# Patient Record
Sex: Female | Born: 1985 | Hispanic: No | Marital: Married | Smoking: Former smoker
Health system: Southern US, Community
[De-identification: ages and names within clinical notes are randomized; demographics above are authoritative.]

## PROBLEM LIST (undated history)

## (undated) ENCOUNTER — Inpatient Hospital Stay (HOSPITAL_COMMUNITY): Payer: Self-pay

## (undated) DIAGNOSIS — R0609 Other forms of dyspnea: Secondary | ICD-10-CM

## (undated) DIAGNOSIS — D696 Thrombocytopenia, unspecified: Secondary | ICD-10-CM

## (undated) DIAGNOSIS — D649 Anemia, unspecified: Secondary | ICD-10-CM

## (undated) DIAGNOSIS — K219 Gastro-esophageal reflux disease without esophagitis: Secondary | ICD-10-CM

## (undated) DIAGNOSIS — R0600 Dyspnea, unspecified: Secondary | ICD-10-CM

## (undated) DIAGNOSIS — R000 Tachycardia, unspecified: Secondary | ICD-10-CM

## (undated) HISTORY — PX: WISDOM TOOTH EXTRACTION: SHX21

## (undated) HISTORY — DX: Other forms of dyspnea: R06.09

## (undated) HISTORY — DX: Tachycardia, unspecified: R00.0

## (undated) HISTORY — DX: Dyspnea, unspecified: R06.00

## (undated) HISTORY — PX: APPENDECTOMY: SHX54

---
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
## 2003-03-14 ENCOUNTER — Other Ambulatory Visit: Admission: RE | Admit: 2003-03-14 | Discharge: 2003-03-14 | Payer: Self-pay | Admitting: Obstetrics and Gynecology

## 2003-03-23 ENCOUNTER — Encounter (INDEPENDENT_AMBULATORY_CARE_PROVIDER_SITE_OTHER): Payer: Self-pay | Admitting: Specialist

## 2003-03-24 ENCOUNTER — Observation Stay (HOSPITAL_COMMUNITY): Admission: EM | Admit: 2003-03-24 | Discharge: 2003-03-25 | Payer: Self-pay | Admitting: Emergency Medicine

## 2003-04-04 ENCOUNTER — Emergency Department (HOSPITAL_COMMUNITY): Admission: EM | Admit: 2003-04-04 | Discharge: 2003-04-04 | Payer: Self-pay | Admitting: Emergency Medicine

## 2003-04-25 ENCOUNTER — Emergency Department (HOSPITAL_COMMUNITY): Admission: EM | Admit: 2003-04-25 | Discharge: 2003-04-25 | Payer: Self-pay | Admitting: Family Medicine

## 2003-05-12 ENCOUNTER — Emergency Department (HOSPITAL_COMMUNITY): Admission: EM | Admit: 2003-05-12 | Discharge: 2003-05-12 | Payer: Self-pay | Admitting: Emergency Medicine

## 2003-07-13 ENCOUNTER — Emergency Department (HOSPITAL_COMMUNITY): Admission: EM | Admit: 2003-07-13 | Discharge: 2003-07-13 | Payer: Self-pay | Admitting: Emergency Medicine

## 2003-07-18 ENCOUNTER — Other Ambulatory Visit: Admission: RE | Admit: 2003-07-18 | Discharge: 2003-07-18 | Payer: Self-pay | Admitting: Obstetrics and Gynecology

## 2003-08-06 ENCOUNTER — Emergency Department (HOSPITAL_COMMUNITY): Admission: EM | Admit: 2003-08-06 | Discharge: 2003-08-07 | Payer: Self-pay | Admitting: Emergency Medicine

## 2003-08-09 ENCOUNTER — Ambulatory Visit (HOSPITAL_COMMUNITY): Admission: RE | Admit: 2003-08-09 | Discharge: 2003-08-09 | Payer: Self-pay | Admitting: Emergency Medicine

## 2003-09-24 ENCOUNTER — Inpatient Hospital Stay (HOSPITAL_COMMUNITY): Admission: AD | Admit: 2003-09-24 | Discharge: 2003-09-24 | Payer: Self-pay | Admitting: Obstetrics and Gynecology

## 2003-09-29 ENCOUNTER — Emergency Department (HOSPITAL_COMMUNITY): Admission: EM | Admit: 2003-09-29 | Discharge: 2003-09-29 | Payer: Self-pay | Admitting: Emergency Medicine

## 2003-10-19 ENCOUNTER — Inpatient Hospital Stay (HOSPITAL_COMMUNITY): Admission: AD | Admit: 2003-10-19 | Discharge: 2003-10-19 | Payer: Self-pay | Admitting: Obstetrics and Gynecology

## 2003-11-25 ENCOUNTER — Inpatient Hospital Stay (HOSPITAL_COMMUNITY): Admission: AD | Admit: 2003-11-25 | Discharge: 2003-11-25 | Payer: Self-pay | Admitting: Obstetrics and Gynecology

## 2004-01-14 ENCOUNTER — Emergency Department (HOSPITAL_COMMUNITY): Admission: EM | Admit: 2004-01-14 | Discharge: 2004-01-14 | Payer: Self-pay | Admitting: Family Medicine

## 2004-02-12 ENCOUNTER — Encounter: Payer: Self-pay | Admitting: Obstetrics and Gynecology

## 2004-02-12 ENCOUNTER — Observation Stay (HOSPITAL_COMMUNITY): Admission: AD | Admit: 2004-02-12 | Discharge: 2004-02-13 | Payer: Self-pay | Admitting: Obstetrics and Gynecology

## 2004-04-06 ENCOUNTER — Inpatient Hospital Stay (HOSPITAL_COMMUNITY): Admission: AD | Admit: 2004-04-06 | Discharge: 2004-04-06 | Payer: Self-pay | Admitting: Obstetrics and Gynecology

## 2004-04-15 ENCOUNTER — Inpatient Hospital Stay (HOSPITAL_COMMUNITY): Admission: AD | Admit: 2004-04-15 | Discharge: 2004-04-15 | Payer: Self-pay | Admitting: Obstetrics and Gynecology

## 2004-04-30 ENCOUNTER — Inpatient Hospital Stay (HOSPITAL_COMMUNITY): Admission: AD | Admit: 2004-04-30 | Discharge: 2004-04-30 | Payer: Self-pay | Admitting: Obstetrics and Gynecology

## 2004-05-08 ENCOUNTER — Inpatient Hospital Stay (HOSPITAL_COMMUNITY): Admission: AD | Admit: 2004-05-08 | Discharge: 2004-05-09 | Payer: Self-pay | Admitting: Obstetrics and Gynecology

## 2004-05-10 ENCOUNTER — Inpatient Hospital Stay (HOSPITAL_COMMUNITY): Admission: AD | Admit: 2004-05-10 | Discharge: 2004-05-15 | Payer: Self-pay | Admitting: Obstetrics and Gynecology

## 2004-08-26 ENCOUNTER — Emergency Department (HOSPITAL_COMMUNITY): Admission: EM | Admit: 2004-08-26 | Discharge: 2004-08-27 | Payer: Self-pay | Admitting: Emergency Medicine

## 2004-09-10 ENCOUNTER — Emergency Department (HOSPITAL_COMMUNITY): Admission: EM | Admit: 2004-09-10 | Discharge: 2004-09-10 | Payer: Self-pay | Admitting: Emergency Medicine

## 2004-09-21 ENCOUNTER — Emergency Department (HOSPITAL_COMMUNITY): Admission: EM | Admit: 2004-09-21 | Discharge: 2004-09-21 | Payer: Self-pay | Admitting: Family Medicine

## 2004-10-06 ENCOUNTER — Inpatient Hospital Stay (HOSPITAL_COMMUNITY): Admission: AD | Admit: 2004-10-06 | Discharge: 2004-10-06 | Payer: Self-pay | Admitting: Obstetrics and Gynecology

## 2004-10-20 ENCOUNTER — Ambulatory Visit (HOSPITAL_COMMUNITY): Admission: RE | Admit: 2004-10-20 | Discharge: 2004-10-20 | Payer: Self-pay | Admitting: Cardiology

## 2004-11-05 ENCOUNTER — Other Ambulatory Visit: Admission: RE | Admit: 2004-11-05 | Discharge: 2004-11-05 | Payer: Self-pay | Admitting: Obstetrics and Gynecology

## 2004-12-05 ENCOUNTER — Emergency Department (HOSPITAL_COMMUNITY): Admission: EM | Admit: 2004-12-05 | Discharge: 2004-12-05 | Payer: Self-pay | Admitting: Family Medicine

## 2004-12-22 ENCOUNTER — Emergency Department (HOSPITAL_COMMUNITY): Admission: EM | Admit: 2004-12-22 | Discharge: 2004-12-22 | Payer: Self-pay | Admitting: *Deleted

## 2004-12-31 ENCOUNTER — Emergency Department (HOSPITAL_COMMUNITY): Admission: EM | Admit: 2004-12-31 | Discharge: 2004-12-31 | Payer: Self-pay | Admitting: Emergency Medicine

## 2005-01-23 ENCOUNTER — Emergency Department (HOSPITAL_COMMUNITY): Admission: EM | Admit: 2005-01-23 | Discharge: 2005-01-23 | Payer: Self-pay | Admitting: Family Medicine

## 2005-01-23 ENCOUNTER — Emergency Department (HOSPITAL_COMMUNITY): Admission: EM | Admit: 2005-01-23 | Discharge: 2005-01-23 | Payer: Self-pay | Admitting: Emergency Medicine

## 2005-02-24 ENCOUNTER — Inpatient Hospital Stay (HOSPITAL_COMMUNITY): Admission: AD | Admit: 2005-02-24 | Discharge: 2005-02-24 | Payer: Self-pay | Admitting: Obstetrics and Gynecology

## 2005-08-23 ENCOUNTER — Emergency Department (HOSPITAL_COMMUNITY): Admission: EM | Admit: 2005-08-23 | Discharge: 2005-08-23 | Payer: Self-pay | Admitting: Family Medicine

## 2005-10-10 ENCOUNTER — Emergency Department (HOSPITAL_COMMUNITY): Admission: EM | Admit: 2005-10-10 | Discharge: 2005-10-10 | Payer: Self-pay | Admitting: Family Medicine

## 2005-10-14 ENCOUNTER — Emergency Department (HOSPITAL_COMMUNITY): Admission: EM | Admit: 2005-10-14 | Discharge: 2005-10-14 | Payer: Self-pay | Admitting: Emergency Medicine

## 2006-05-29 ENCOUNTER — Inpatient Hospital Stay (HOSPITAL_COMMUNITY): Admission: AD | Admit: 2006-05-29 | Discharge: 2006-05-29 | Payer: Self-pay | Admitting: Obstetrics and Gynecology

## 2006-06-14 ENCOUNTER — Emergency Department (HOSPITAL_COMMUNITY): Admission: EM | Admit: 2006-06-14 | Discharge: 2006-06-14 | Payer: Self-pay | Admitting: Emergency Medicine

## 2006-12-28 ENCOUNTER — Inpatient Hospital Stay (HOSPITAL_COMMUNITY): Admission: AD | Admit: 2006-12-28 | Discharge: 2006-12-28 | Payer: Self-pay | Admitting: Family Medicine

## 2007-04-23 ENCOUNTER — Emergency Department (HOSPITAL_COMMUNITY): Admission: EM | Admit: 2007-04-23 | Discharge: 2007-04-23 | Payer: Self-pay | Admitting: Emergency Medicine

## 2007-11-07 ENCOUNTER — Encounter: Admission: RE | Admit: 2007-11-07 | Discharge: 2007-11-07 | Payer: Self-pay | Admitting: Family Medicine

## 2007-12-11 ENCOUNTER — Other Ambulatory Visit: Admission: RE | Admit: 2007-12-11 | Discharge: 2007-12-11 | Payer: Self-pay | Admitting: Obstetrics and Gynecology

## 2008-07-17 ENCOUNTER — Emergency Department (HOSPITAL_COMMUNITY): Admission: EM | Admit: 2008-07-17 | Discharge: 2008-07-17 | Payer: Self-pay | Admitting: Adult Health

## 2009-03-14 ENCOUNTER — Emergency Department (HOSPITAL_COMMUNITY): Admission: EM | Admit: 2009-03-14 | Discharge: 2009-03-14 | Payer: Self-pay | Admitting: Family Medicine

## 2009-03-14 ENCOUNTER — Emergency Department (HOSPITAL_COMMUNITY): Admission: EM | Admit: 2009-03-14 | Discharge: 2009-03-15 | Payer: Self-pay | Admitting: Emergency Medicine

## 2009-05-21 ENCOUNTER — Emergency Department (HOSPITAL_COMMUNITY): Admission: EM | Admit: 2009-05-21 | Discharge: 2009-05-21 | Payer: Self-pay | Admitting: Family Medicine

## 2009-07-25 ENCOUNTER — Emergency Department (HOSPITAL_COMMUNITY): Admission: EM | Admit: 2009-07-25 | Discharge: 2009-07-25 | Payer: Self-pay | Admitting: Family Medicine

## 2009-11-26 ENCOUNTER — Emergency Department (HOSPITAL_COMMUNITY): Admission: EM | Admit: 2009-11-26 | Discharge: 2009-11-26 | Payer: Self-pay | Admitting: Family Medicine

## 2010-03-29 ENCOUNTER — Encounter: Payer: Self-pay | Admitting: Obstetrics & Gynecology

## 2010-05-12 ENCOUNTER — Inpatient Hospital Stay (INDEPENDENT_AMBULATORY_CARE_PROVIDER_SITE_OTHER)
Admission: RE | Admit: 2010-05-12 | Discharge: 2010-05-12 | Disposition: A | Discharge status: Home / Self Care | Payer: Self-pay | Source: Ambulatory Visit | Attending: Family Medicine | Admitting: Family Medicine

## 2010-05-12 DIAGNOSIS — N760 Acute vaginitis: Secondary | ICD-10-CM

## 2010-05-12 LAB — WET PREP, GENITAL

## 2010-05-14 ENCOUNTER — Emergency Department (HOSPITAL_COMMUNITY)
Admission: EM | Admit: 2010-05-14 | Discharge: 2010-05-14 | Disposition: A | Discharge status: Home / Self Care | Payer: Self-pay | Attending: Emergency Medicine | Admitting: Emergency Medicine

## 2010-05-14 DIAGNOSIS — R059 Cough, unspecified: Secondary | ICD-10-CM | POA: Insufficient documentation

## 2010-05-14 DIAGNOSIS — J069 Acute upper respiratory infection, unspecified: Secondary | ICD-10-CM | POA: Insufficient documentation

## 2010-05-14 DIAGNOSIS — R05 Cough: Secondary | ICD-10-CM | POA: Insufficient documentation

## 2010-05-14 DIAGNOSIS — F17200 Nicotine dependence, unspecified, uncomplicated: Secondary | ICD-10-CM | POA: Insufficient documentation

## 2010-05-14 LAB — GC/CHLAMYDIA PROBE AMP, GENITAL
Chlamydia, DNA Probe: NEGATIVE
GC Probe Amp, Genital: NEGATIVE

## 2010-05-25 LAB — POCT URINALYSIS DIP (DEVICE)
Nitrite: NEGATIVE
Protein, ur: NEGATIVE mg/dL

## 2010-05-31 LAB — POCT URINALYSIS DIP (DEVICE)
Hgb urine dipstick: NEGATIVE
Nitrite: NEGATIVE
Protein, ur: NEGATIVE mg/dL
pH: 6.5 (ref 5.0–8.0)

## 2010-05-31 LAB — WET PREP, GENITAL: Trich, Wet Prep: NONE SEEN

## 2010-05-31 LAB — URINE CULTURE

## 2010-05-31 LAB — HCG, SERUM, QUALITATIVE: Preg, Serum: NEGATIVE

## 2010-05-31 LAB — POCT PREGNANCY, URINE: Preg Test, Ur: NEGATIVE

## 2010-06-16 LAB — POCT I-STAT, CHEM 8
BUN: 5 mg/dL — ABNORMAL LOW (ref 6–23)
Calcium, Ion: 1.21 mmol/L (ref 1.12–1.32)
Chloride: 104 mEq/L (ref 96–112)
Creatinine, Ser: 0.8 mg/dL (ref 0.4–1.2)
Potassium: 3.7 mEq/L (ref 3.5–5.1)

## 2010-06-16 LAB — POCT PREGNANCY, URINE: Preg Test, Ur: NEGATIVE

## 2010-07-24 NOTE — Discharge Summary (Signed)
NAMEMarland Kitchen  AASHKA, Brandy NO.:  [id number]   MEDICAL RECORD NO.:  [id number]          PATIENT TYPE:  INP   LOCATION:  9101                          FACILITY:  [facility]   PHYSICIAN:  Osborn Coho, M.D.   DATE OF BIRTH:  [birth year]   DATE OF ADMISSION:  05/10/2004  DATE OF DISCHARGE:  05/14/2004                                 DISCHARGE SUMMARY   ADMITTING DIAGNOSES:  1.  Intrauterine pregnancy at 40 weeks.  2.  Syncopal episode.  3.  PENICILLIN allergy.  4.  History of sexually transmitted disease.  5.  Prolonged prodromal labor.   DISCHARGE DIAGNOSES:  1.  Intrauterine pregnancy at 40-2/7 weeks.  2.  Prodromal labor.  3.  Transverse lie.  4.  Mild thrombocytopenia.   PROCEDURES:  1.  Primary low transverse cesarean section.  2.  Spinal anesthesia.   HOSPITAL COURSE:  Brandy Ferguson is a 25 year old gravida 1, para 0, at 40 weeks,  who was admitted on May 10, 2004, status post an episode of reported  syncope.  She had primarily dizzy episodes, never lost consciousness.  She  had been seen at maternity admissions 2 days prior with prodromal labor.  Her pregnancy had been remarkable for (1) History of abnormal Pap smear.  (2) PENICILLIN allergy.  (3) History of STDs.  (4) Previous smoker.  (5)  Beta strep positive.  On admission, O2 saturation was weakness.  The patient  was alert and oriented.  Fetal heart rate was reactive.  Cervix was a  fingertip, thick, with the presenting part high in the pelvis.  The patient  was admitted for 23 hour observation and therapeutic rest.  Through the  night, she continued to contract.  By the next morning, she was still having  contractions approximately every 5 minutes.  The decision was made to  proceed with induction; however, on pelvic examination, the patient was  noted to be presumptively breech.  Bedside ultrasound confirmed the breech  presentation.  A formal ultrasound was performed which, at that time, showed  the baby  in a transverse lie.  Dr. Estanislado Pandy was consulted, and the decision  was made after review of options including external version or cesarean  section, the patient elected to proceed with cesarean section.  The patient  was taken to the operating room where a primary low transverse cesarean  section was performed.  During the preparatory time for the C-section, the  baby was noted to be actively turning within the pelvis.  Ultrasound in the  OR revealed a very oblique lie with the head still not in the pelvis but  over in the right lower quadrant.  Risks and benefits of admission for  induction with Pitocin then with artificial rupture of membranes was  reviewed with the patient; however, in light of her unfavorable cervix,  unstable lie, and prodromal labor, the decision was made to proceed with  cesarean section.  The patient tolerated the procedure well.  She had a  viable female by the name of Josiya, born at 4:51.  Apgars were 9 and 9.  Weight was 7 pounds 13 ounces.  The patient was taken to the recovery room  in good condition.  Infant was taken to the full-term nursery.  By postop  day 1, the patient was doing well.  She was up ad lib.  She was bottle-  feeding.  She had staples noted.  Her hemoglobin was 10.1, white blood cell  count was 7.5, platelets were 122 which was up from 112 on admission.  The  patient's platelet count at her new OB visit was 216.  Through the rest of  her hospital stay, she continued to do well.  She did have vaginal yeast  noted prior to delivery.  During her hospital stay, she was given Diflucan.  By postop day 3, she was doing well.  She was ready for discharge.  Her  incision was clean, dry, and intact.  Her vital signs were stable.  She was  tolerating a regular diet, and her pain management was controlled well with  p.o. pain medication.  She was deemed to have received full benefit of her  hospital stay and was discharged home.   DISCHARGE  INSTRUCTIONS:  Per [facility] handout.   DISCHARGE MEDICATIONS:  1.  Motrin 600 mg p.o. q.6h. p.r.n. pain.  2.  Tylox 1-2 p.o. q.2-4h. p.r.n. pain.   Discharge follow up will occur in 6 weeks at [facility].  There  will be a recheck of her platelet count at 6 weeks postpartum.      VLL/MEDQ  D:  05/14/2004  T:  05/14/2004  Job:  [id number]

## 2010-07-24 NOTE — Op Note (Signed)
NAME:  Brandy Ferguson, Brandy Ferguson                           ACCOUNT NO.:  [id number]   MEDICAL RECORD NO.:  [id number]                   PATIENT TYPE:  INP   LOCATION:  0101                                 FACILITY:  [facility]   PHYSICIAN:  Lorre Munroe., M.D.            DATE OF BIRTH:  11/12/1985   DATE OF PROCEDURE:  03/24/2003  DATE OF DISCHARGE:                                 OPERATIVE REPORT   PREOPERATIVE DIAGNOSIS:  Abdominal pain, probable appendicitis.   POSTOPERATIVE DIAGNOSIS:  Abdominal pain, probably due to a ruptured ovarian  cyst or follicle.   OPERATION:  1. Laparoscopy.  2. Laparoscopic appendectomy.   SURGEON:  Zigmund Daniel, M.D.   ANESTHESIA:  General.   CLINICAL NOTE:  This is a 25 year old black female, who has had about a 3  month history of lower abdominal pain, but it became much worse yesterday,  and she was seen at the emergency room.  She was found to have right lower  quadrant tenderness.  She had a normal WBC count and had no fever and had  had no vomiting.  A CT scan was done which showed some free pelvic and right  gutter fluid, inflammation around the area of the appendix which was minor,  and slightly thickened appendix, suggesting appendicitis.  The patient was  given the option of observation versus laparoscopy and appendectomy, and she  felt that this pain was quite significant and wanted to be operated on.   DESCRIPTION OF PROCEDURE:  After the patient was monitored and anesthetized  and had routine preparation and draping of the abdomen, I anesthetized the  site in the lower midline and one just below the umbilicus and one in the  right upper quadrant.  I made a small transverse incision just below the  umbilicus and cut her fascia longitudinally in the midline and bluntly  opened the peritoneum.  I put in a 0 Vicryl pursestring suture and secured a  Hasson cannula and inflated the abdomen with CO2.  I then viewed the  abdominal contents and  saw a small amount of bloody but nonclotted fluid in  the right gutter and around the liver in the pelvis.  I could see the  appendix, and it did not look inflamed.  There was no purulence present.  The small bowel and colon looked completely normal, and the gallbladder  looked normal.  I then put in two additional ports, one in the right upper  quadrant and one in the lower midline and pulled the small bowel and colon  up out of the pelvis and inspected the pelvic organs.  There was a fair  amount of bloody fluid in the cul-de-sac.  I suctioned that out.  The tubes  and ovaries looked normal, except I could see a small area where there  appeared to have been some bleeding on the right ovary.  I saw no other  abnormalities, and I though the uterus looked normal.  I then positioned the  patient in slight Trendelenburg and slightly tilted to the left.  I elevated  the appendix.  There were quite a few adhesions in the appendix to the  lateral pelvic wall, and I took those down sharply and cauterized any  bleeders that occurred.  Then I dissected the appendix and mesentery down  until they were both quite free and thin.  I stapled across the mesentery  and the appendix with one firing of the endoscopic cutting stapler.  That  provided good hemostasis and good clean amputation of the appendix.  I then  further irrigated in the pelvis and the right gutter and removed the  irrigant, and I saw no evidence of persistent bleeding.  I then placed the  appendix in a plastic pouch and removed through the umbilical incision and  tied the pursestring suture.  I once again checked all areas for hemostasis  and found it to be good.  I removed the  right upper quadrant port under direct vision and then I allowed the CO2 to  escape and removed the lower midline port.  I then closed all the skin  incisions with intracuticular 4-0 Vicryl and Steri-Strips.  She tolerated it  well.  The sponge, needle, and  instrument counts were correct.                                               Lorre Munroe., M.D.    WB/MEDQ  D:  03/24/2003  T:  03/24/2003  Job:  [id number]

## 2010-07-24 NOTE — Discharge Summary (Signed)
NAMECARMIN, ALVIDREZ                 ACCOUNT NO.:  [id number]   MEDICAL RECORD NO.:  [id number]          PATIENT TYPE:  OBV   LOCATION:  9162                          FACILITY:  [facility]   PHYSICIAN:  Janine Limbo, M.D.DATE OF BIRTH:  09-30-1985   DATE OF ADMISSION:  02/12/2004  DATE OF DISCHARGE:  02/13/2004                                 DISCHARGE SUMMARY   ADMISSION DIAGNOSES:  1.  Intrauterine pregnancy at [redacted] weeks gestation.  2.  Report of syncopal episode and feeling weak.  3.  Reassuring fetal heart rate tracing.   DISCHARGE DIAGNOSES:  1.  Intrauterine pregnancy at 27 weeks, no further syncopal episode.  2  Reassuring fetal heart rate tracing.  1.  Reassuring laboratory studies.  2.  Reassuring CT scan.   PROCEDURES THIS ADMISSION:  None.   HOSPITAL COURSE:  [name] is a 25 year old single black female gravida 1  para 0 at [redacted] weeks gestation who was sent from [facility]  office to maternity admission for evaluation secondary to reported syncopal  episode while at the office.  She actually did not get completely  unresponsive but was complaining of feeling very weak and dizzy and having  chest pain.  She denies any trauma.  She reported having had only a bowl  of Fruit Loops and juice for her lunch that day.  Subsequent to admission  she has had no further syncopal episodes.  She has tolerated a regular diet  without difficulty.  She is ambulating, voiding and eating without  difficulty.  Her vital signs have also remained stable and she is afebrile.  Her fetal heart rate is also reassuring with reactivity.  She has had  occasional uterine contractions but nothing regular.  She is now deemed  ready for discharge.   DISCHARGE LABORATORY DATA:  Her hemoglobin is 11.7, wbc count is 5.9,  platelets are 147.  Sodium was 135, potassium is 3.3, AST is 19, ALT is 19,  uric acid is 3.4, LDH is 101.  Her CK-MB, her cardiac markers were all  within normal limits.   Her CT of the chest is also within normal limits with  no evidence of pulmonary embolus.   DISCHARGE INSTRUCTIONS:  To call for any signs or symptoms or preterm labor,  vaginal leaking or bleeding, or other distress.   DISCHARGE FOLLOW-UP:  As scheduled on February 20, 2004 at the office or  p.r.n..   DISCHARGE MEDICATIONS:  Continue on prenatal vitamins daily and to take  ibuprofen 600 mg p.o. q.6h. p.r.n. for discomfort up until [redacted] weeks  gestation.   DISCHARGE STATUS:  Well and stable.     Shel   SJD/MEDQ  D:  02/13/2004  T:  02/14/2004  Job:  [id number]

## 2010-07-24 NOTE — H&P (Signed)
NAME:  ROTUNDA, WORDEN NO.:  [id number]   MEDICAL RECORD NO.:  [id number]                   PATIENT TYPE:   LOCATION:                                       FACILITY:   PHYSICIAN:  Lorre Munroe., M.D.            DATE OF BIRTH:   DATE OF ADMISSION:  03/24/2003  DATE OF DISCHARGE:                                HISTORY & PHYSICAL   CHIEF COMPLAINT:  Abdominal pain.   HISTORY OF PRESENT ILLNESS:  The patient is a healthy 25 year old black  female who has had about a 62-month history of fairly persistent lower  abdominal pain.  It got much worse yesterday and was localized to the right  lower quadrant.  She has not vomited.  No diarrhea.  No cramps.  Her  appetite has decreased.  White count was normal and hemoglobin normal in the  emergency department.  The periods have been regular and a urine pregnancy  test was negative.  Urinalysis was unremarkable.  A CT scan was done showing  evidence of thickening of the appendix, free fluid, and some inflammation  around the appendiceal mesentery.  The patient is admitted for care.   PAST MEDICAL HISTORY:  Excellent health.  No medicines.  She has been told  she is ALLERGIC to PENICILLIN although she does not remember what the  reaction is.  She has never had any operations except for extraction of  wisdom teeth.  She has no chronic medical problems.   The family history and childhood illnesses unremarkable.   She does not smoke or drink or use any nonprescription drugs.   The review of systems of 15-points is unremarkable.   PHYSICAL EXAMINATION:  VITAL SIGNS:  Temperature and vital signs normal as  reported by nursing staff.  GENERAL:  A healthy-appearing young woman, slender.  In no acute distress.  HEAD, NECK, EYES, EARS, NOSE, MOUTH, AND THROAT:  Unremarkable.  CHEST:  Clear to auscultation.  HEART:  Rate and rhythm normal.  No murmur or gallop.  ABDOMEN:  Soft with bowel sounds.  Moderately tender  in the right lower  quadrant with rebound tenderness.  PELVIC AND RECTAL:  Not done.  EXTREMITIES:  Normal.  SKIN:  Normal.  LYMPH NODES:  Normal.   IMPRESSION:  Possible appendicitis.   PLAN:  A laparoscopic appendectomy as planned after a thorough discussion  with the patient of pros and cons of observation versus immediate surgery  are discussed.  She accepts the risks that there will of an operation for a  self-limited illness with whether it would have gotten better by itself,  that she could have infections, bleeding or other problems.  We will proceed  immediately with surgery.  Lorre Munroe., M.D.    WB/MEDQ  D:  03/24/2003  T:  03/24/2003  Job:  [id number]

## 2010-07-24 NOTE — H&P (Signed)
NAME:  Brandy Ferguson, WHEELESS NO.:  [id number]   MEDICAL RECORD NO.:  [id number]          PATIENT TYPE:  OBV   LOCATION:  9174                          FACILITY:  [facility]   PHYSICIAN:  Osborn Coho, M.D.   DATE OF BIRTH:  Jul 02, 1985   DATE OF ADMISSION:  05/10/2004  DATE OF DISCHARGE:                                HISTORY & PHYSICAL   HISTORY OF PRESENT ILLNESS:  [name] is a 25 year old gravida 1, para 0,  at [redacted] weeks gestation, EDD May 11, 2004, who presents by EMS following an  episode of syncope at home today.  She reports several episodes of syncope  x3 today throughout the day.  She reports that she was sitting and felt  dizzy and the episodes of syncope were brief with no residual effects,  although, she has felt dizzy and short of breath throughout the day.  She  also complains of lower abdominal and back pain.  She remains short of  breath at the present time with continued lower abdominal and back pain.  No  nausea and vomiting.  She states she is eating and drinking.  She ate  chicken at 2100 on May 10, 2004.  She reports positive fetal movement, no  vaginal bleeding, no rupture of membranes.  Her pregnancy has been followed  by the [facility] service at [facility] and is remarkable for:  1) History of abnormal  Pap smear.  2) Penicillin allergy.  3) History of STD's.  4) Previous  smoker.  5) Group B Strep positive.   PRENATAL LABORATORY DATA:  October 11, 2003; hemoglobin and hematocrit 13.2  and 38.9, platelets 216,000. Blood type and Rh O positive, antibody screen  negative, VDRL nonreactive, rubella immune.  Hepatitis B surface antigen  negative, HIV nonreactive.  Hemoglobin electrophoresis negative, CF testing  negative.  Pap smear within normal limits.  Throughout her pregnancy, she  has been positive several times for Chlamydia in both the second and third  trimesters.  Test of cure on March 27, 2004, all negative.  Her pregnancy  has been followed since her  initial prenatal visit on October 11, 2003, at  nine weeks gestation, [facility] determined by dates and confirmed with ultrasound.  As stated above, the patient has been treated repeatedly for positive  Chlamydia x3 throughout her pregnancy.  Test of cure has been negative.  She  has had several incidences of preterm contractions.  Fetal fibronectin has  been negative and the patient's cervix has remained undilated.  She has been  size equal dates throughout, normotensive, with no proteinuria.  She has  also been evaluated throughout her pregnancy for shortness of breath, spiral  CT done in December was negative.   OB HISTORY:  Present pregnancy.   PAST MEDICAL HISTORY:  Significant for an abnormal Pap and colposcopy.  The  patient has had repeated STD's.  She is a smoker.  She quit with her  pregnancy.  She had her wisdom teeth removed in November of 2002, a  laparoscopic appendectomy in January of 2005, and ovarian cyst.   FAMILY  HISTORY:  The patient's mother with a history of hypertension,  maternal grandmother and uncle with diabetes, a cousin of the patient's had  seizures as a child.   GENETIC HISTORY:  Significant for a female cousin with cerebral palsy.   ALLERGIES:  PENICILLIN.   HABITS:  She quit cigarette smoking with positive UPT and denies the use of  alcohol or illicit drugs.   SOCIAL HISTORY:  [name] is a 25 year old single African-American female.  She does not name a father of the baby.  She does not subscribe to a  religious faith.   REVIEW OF SYSTEMS:  As described above.   PHYSICAL EXAMINATION:  VITAL SIGNS:  Stable, the patient is afebrile.  HEENT:  Unremarkable.  HEART:  Regular rate and rhythm.  LUNGS:  Clear bilaterally throughout to auscultation.  Her O2 saturation is  98 to 99%.  GENERAL:  She is alert and oriented x3.  The baseline of the fetal heart  rate monitor is 140's with average longterm variability, reactivity is  present with no periodic  changes.  The patient is contracting irregularly  every two to eight minutes.  PELVIC:  Her cervix on digital examination is a fingertip dilated, thick,  with the presenting part high in the pelvis.  ABDOMEN:  Soft and nontender.  She has negative CVA tenderness bilaterally.  EXTREMITIES:  No edema.  DTR's are 1+ with no clonus.  She has no calf  tenderness.   Spiral CT done on February 12, 2004, is within normal limits with no evidence  of PE.   ASSESSMENT:  1.  Intrauterine pregnancy at term.  2.  Shortness of breath.  3.  Abdominal and back pain.   PLAN:  Admit for 23-hour observation for therapeutic rest.  She is to be  medicated with morphine 5 mg IV and 5 mg subcu per Osborn Coho, M.D. and  to reevaluate following therapeutic rest.      SDM/MEDQ  D:  05/11/2004  T:  05/11/2004  Job:  [id number]

## 2010-07-24 NOTE — Op Note (Signed)
NAMESHUNDRA, WIRSING                 ACCOUNT NO.:  [id number]   MEDICAL RECORD NO.:  [id number]          PATIENT TYPE:  OIB   LOCATION:  2899                         FACILITY:  [facility]   PHYSICIAN:  Armanda Magic, M.D.     DATE OF BIRTH:  [birth year]   DATE OF PROCEDURE:  10/20/2004  DATE OF DISCHARGE:  10/20/2004                                 OPERATIVE REPORT   PROCEDURE PERFORMED:  Tilt table test.   OPERATOR:  Armanda Magic, M.D.   INDICATIONS FOR PROCEDURE:  Syncope.   This is a very pleasant 25 year old black female with multiple episodes of  presyncope and syncope recently.  Several of them occurred while she was in  the doctor's office.  She now presents for tilt table testing.   DESCRIPTION OF PROCEDURE:  The patient was brought to the cardiac  catheterization laboratory in a fasting nonsedated state.  Informed consent  was obtained.  The patient was placed on continuous heart rate, pulse  oximetry monitoring and intermittent blood pressure monitoring.  Supine  baseline blood pressure was measured for five minutes and averaged around  110/67 to 114/77 mmHg with a heart rate in the 60s.  The patient was then  tilted upright to 70 degrees for a total of 30 minutes.  Lowest blood  pressure achieved during upright tilt was 99/68 mmHg.  The patient's highest  heart rate was in the 80s.  The patient was then placed supine and started  on Isuprel at 1 mcg and after an increase in heart rate of 20% from baseline  occurred, the patient was retilted upright for a total of 6 minutes.  Blood  pressure on initial upright tilt was 99/76 with a heart rate of 62.  Patient  then started having hiccups and then became very nauseated at four minutes  into the upright tilt.  At four minutes into the upright tilt, the blood  pressure was 104/69 with a heart rate of 78.  The patient became profoundly  nauseated and started gagging.  Blood pressure dropped to 84/56 with a heart  rate of 54 and the  patient passed out.  She was subsequently placed supine  with improvement in her blood pressure and heart rate and was then alert.  The patient was then after fully awakened, blood pressure had stabilized,  patient was transferred back to room in stable condition and later was  discharged to home.   IMPRESSION:  1.  Positive tilt table test for vasovagal syncope.   PLAN:  1.  Start Zoloft 25 mg a day to try to suppress the vasovagal syncope.  Of      note, the patient recently had a baby about eight months ago, but is not      breast feeding.  I will hold off on starting her on beta blockers at      this time since her heart rate is in the low 60s at baseline and she had      a systolic blood pressure in the low 100s in our      office.  She is going to see me back in two weeks and we will repeat her      tilt table test in three weeks to make sure that she no longer has      syncope.  She is to continue not to drive or work since she on some      occasions has no warning before passing out.      Armanda Magic, M.D.  Electronically Signed     TT/MEDQ  D:  10/21/2004  T:  10/21/2004  Job:  [id number]

## 2010-07-24 NOTE — H&P (Signed)
NAMEMarland Kitchen  Brandy, Ferguson                 ACCOUNT NO.:  [id number]   MEDICAL RECORD NO.:  [id number]          PATIENT TYPE:  OBV   LOCATION:  9162                          FACILITY:  [facility]   PHYSICIAN:  Naima A. Dillard, M.D. DATE OF BIRTH:  [birth year]   DATE OF ADMISSION:  02/12/2004  DATE OF DISCHARGE:                                HISTORY & PHYSICAL   HISTORY:  [name] is a 25 year old single black female primigravida at [redacted]  weeks gestation who presented from [facility] office after a  syncopal episode there.  She had no head injury at that time.  Patient has  some occasional contractions which is why she had an evaluation at the  office today.  Her pregnancy has been followed by the [facility] certified nurse midwife service and has been remarkable for (1) history  of abnormal Pap smear, (2) questionable PENICILLIN allergy, (3) history of  STDs, (4) previous smoker, (5) irregular cycles.   PRENATAL LABORATORIES:  Her prenatal labs were collected on October 11, 2003.  Hemoglobin 13.2, hematocrit 38.9, platelets 216,000, blood type O positive,  antibody negative, RPR nonreactive, rubella immune, hepatitis B surface  antigen negative, HIV nonreactive.  Pap smear from May 2005 was within  normal limits.  Gonorrhea and Chlamydia from that same time are negative.  Cystic fibrosis negative.  Hemoglobin electrophoresis negative.  Patient had  a positive Chlamydia in September 2005 and she was treated with Zithromax.  Patient was again positive for Chlamydia in October 2005 and was treated  with Zithromax.  Her test of cure was negative on January 08, 2004.   HISTORY OF PRESENT PREGNANCY:  Patient presented for care at [facility] at 9-1/[redacted] weeks gestation.  She was treated with MetroGel for  bacterial vaginosis at that time.  Pregnancy ultrasonography at [redacted] weeks  gestation confirmed [facility] of May 11, 2004.   OBSTETRICAL HISTORY:  She is a primigravida.   MEDICAL HISTORY:  She has a questionable PENICILLIN allergy, as a child she  was told not to take it.  She experienced menarche at the age of 28.  She  has had an irregular cycle since then.  She had an abnormal Pap smear in  2002 and 2004.  She had colposcopy in 2002.  She had Chlamydia in 2004.  She  reports having had the usual childhood illnesses.  She had Trichomonas in  October 2003.  She has a history of anemia.  She smoked until the beginning  of her pregnancy when she stopped.   SURGICAL HISTORY:  Wisdom teeth extraction November 2002 and appendectomy in  January 2005.   FAMILY MEDICAL HISTORY:  Mother with hypertension, maternal grandmother and  uncle with diabetes, cousin with seizures as a child.   GENETIC HISTORY:  Genetic history is remarkable for female cousin with  cerebral palsy.   SOCIAL HISTORY:  Father of the baby is not involved with the pregnancy.  Patient has 13 years of education and is currently unemployed.  She smoked  cigarettes until September 11, 2003.  She denies  any alcohol or street drug use  with the pregnancy.   OBJECTIVE DATA:  VITAL SIGNS:  Stable.  She is afebrile.  HEENT:  Grossly within normal limits.  CHEST:  Clear to auscultation.  HEART:  Regular rate and rhythm.  ABDOMEN:  Gravid in contour with fundal height extending approximately 27 cm  above pubic symphysis.  Fetal heart rate is reassuring with positive  accelerations and no decelerations.  Toco shows no contractions, occasional  irritability.  PELVIC:  Cervix is closed and long.  EXTREMITIES:  Within normal limits.   LABORATORIES:  EKG shows normal sinus rhythm.  Spiral CT shows no evidence  of PE.  Her cardiac enzymes were normal and the rest of her lab work was  normal.   ASSESSMENT:  1.  Intrauterine pregnancy at 27 weeks.  2.  Syncopal episode.   PLAN:  Admit to antenatal for 23-hour observation.     Kimb   KS/MEDQ  D:  02/12/2004  T:  02/12/2004  Job:  [id number]

## 2010-07-24 NOTE — Op Note (Signed)
NAME:  Brandy Ferguson, Brandy Ferguson                 ACCOUNT NO.:  [id number]   MEDICAL RECORD NO.:  [id number]          PATIENT TYPE:  INP   LOCATION:  9101                          FACILITY:  [facility]   PHYSICIAN:  Crist Fat. Rivard, M.D. DATE OF BIRTH:  August 13, 1985   DATE OF PROCEDURE:  DATE OF DISCHARGE:                                 OPERATIVE REPORT   PREOPERATIVE DIAGNOSES:  1.  Intrauterine pregnancy at 40 weeks 2 days.  2.  Transverse lie.  3.  Prodromal labor.   POSTOPERATIVE DIAGNOSES:  1.  Intrauterine pregnancy at 40 weeks 2 days.  2.  Transverse lie.  3.  Prodromal labor.   PROCEDURE:  Primary low transverse cesarean section.   SURGEON:  Crist Fat. Rivard, M.D.   ASSISTANTRenaldo Reel. Latham, C.N.M.   ESTIMATED BLOOD LOSS:  800 mL.   PROCEDURE:  After being informed of the planned procedure with possible  complications including bleeding, infection, injury to bowel, bladder and  ureters, as well as the risks of VBAC, informed consent was obtained.  Please note that discussion took place with the possibility of external  cephalic version, which the patient declined.  The patient was taken to OR  #1, given spinal anesthesia without complication, and placed in the dorsal  decubitus position, pelvis tilted toward the left.  She was prepped and  draped in a sterile fashion and a Foley catheter was inserted in her  bladder.  After assessing adequate level of anesthesia, we proceeded with  infiltration of the suprapubic area using 20 mL of Marcaine 0.25%.  At that  time increased fetal activity was noted and question was raised on the  current presentation of the baby, and so a sterile ultrasound probe was used  to perform ultrasound and confirm an oblique station with head now in the  right lower quadrant as opposed to the left upper quadrant of the mother,  previously noted on ultrasound findings.  Before proceeding, the patient was  informed of the change of her infant's lie, was informed  that it was  currently right lower quadrant oblique, could easily be turned to vertex  presentation and patient could be transferred back to labor and delivery  with cervical ripening and possible induction.  The patient inquired at that  time, How can I confirm that the baby will not change position during the  process of induction? and I told her that I could not guarantee that,  especially in light of multiple lie changes during the day even despite  contractions every five minutes.  After reviewing risks and benefits of both  options, the patient requests proceeding with primary low transverse  cesarean section.   We proceeded with a Pfannenstiel incision using knife, which was brought  down to the fascia.  Fascia was then incised in a low transverse fashion.  Linea alba was bisected, peritoneum was entered in a midline fashion.  At  this time the baby is in transverse lie with head to maternal right.  We  proceed with amniotomy.  Amniotic fluid is clear, and baby comes down  head-  first.  We assist the birth of a female infant in vertex presentation at  4:51 p.m.  Mouth and nose are suctioned, nuchal cord is reduced and body is  delivered.  Cord is clamped with two Kelly clamps and sectioned, and the  baby is given to the pediatrician present in the room.  Twenty milliliters  of blood is drawn from the umbilical vein, and the placenta is allowed to  deliver spontaneously.  It is complete, cord has three vessels, and uterine  revision is negative.  We proceed with closure of the myometrium in two  layers, first with a running locked suture of 0 Vicryl, then with a Lembert  suture of 0 Vicryl imbricating the first layer.   Hemostasis on the incision is adequate.  Both paracolic gutters are  cleansed, both tubes and ovaries assessed and normal, and the pelvis is  profusely irrigated with warm saline.  Hemostasis is rechecked and adequate.  Under-fascia hemostasis is completed with  cautery, and the fascia is closed  with two running sutures of 1 Vicryl meeting midline.  Incision is irrigated  with warm saline, hemostasis is completed with cautery, and skin is closed  with staples.   Instrument and sponge count is complete x2.  Estimated blood loss is 800 mL.  The procedure is very well-tolerated by the patient, who is taken to the  recovery room in a well and stable condition.   A little girl named Sandria Bales was born at 4:51 p.m., received an Apgar of 9  at one minute and 9 at five minutes, and weighs 7 pounds 13 ounces.      SAR/MEDQ  D:  05/11/2004  T:  05/12/2004  Job:  [id number]

## 2010-10-29 ENCOUNTER — Emergency Department (HOSPITAL_COMMUNITY)
Admission: EM | Admit: 2010-10-29 | Discharge: 2010-10-30 | Disposition: A | Discharge status: Home / Self Care | Payer: Self-pay | Attending: Emergency Medicine | Admitting: Emergency Medicine

## 2010-10-29 DIAGNOSIS — F17200 Nicotine dependence, unspecified, uncomplicated: Secondary | ICD-10-CM | POA: Insufficient documentation

## 2010-10-29 DIAGNOSIS — A599 Trichomoniasis, unspecified: Secondary | ICD-10-CM | POA: Insufficient documentation

## 2010-10-29 DIAGNOSIS — N12 Tubulo-interstitial nephritis, not specified as acute or chronic: Secondary | ICD-10-CM | POA: Insufficient documentation

## 2010-10-29 DIAGNOSIS — R509 Fever, unspecified: Secondary | ICD-10-CM | POA: Insufficient documentation

## 2010-10-29 LAB — URINE MICROSCOPIC-ADD ON

## 2010-10-29 LAB — URINALYSIS, ROUTINE W REFLEX MICROSCOPIC
Protein, ur: 30 mg/dL — AB
Specific Gravity, Urine: 1.014 (ref 1.005–1.030)

## 2010-10-31 ENCOUNTER — Emergency Department (HOSPITAL_COMMUNITY)
Admission: EM | Admit: 2010-10-31 | Discharge: 2010-11-01 | Disposition: A | Discharge status: Home / Self Care | Payer: Self-pay | Attending: Emergency Medicine | Admitting: Emergency Medicine

## 2010-10-31 DIAGNOSIS — F17200 Nicotine dependence, unspecified, uncomplicated: Secondary | ICD-10-CM | POA: Insufficient documentation

## 2010-10-31 DIAGNOSIS — R109 Unspecified abdominal pain: Secondary | ICD-10-CM | POA: Insufficient documentation

## 2010-10-31 DIAGNOSIS — A599 Trichomoniasis, unspecified: Secondary | ICD-10-CM | POA: Insufficient documentation

## 2010-11-27 LAB — URINALYSIS, ROUTINE W REFLEX MICROSCOPIC
Bilirubin Urine: NEGATIVE
Ketones, ur: NEGATIVE
Nitrite: NEGATIVE
Protein, ur: 30 — AB
Specific Gravity, Urine: 1.014
Urobilinogen, UA: 0.2

## 2010-11-27 LAB — URINE MICROSCOPIC-ADD ON

## 2010-11-27 LAB — POCT PREGNANCY, URINE
Operator id: 29452
Preg Test, Ur: NEGATIVE

## 2010-12-16 LAB — CBC
HCT: 36.6
Hemoglobin: 12.6
MCHC: 34.5
MCV: 96.2
RBC: 3.81 — ABNORMAL LOW

## 2010-12-16 LAB — GC/CHLAMYDIA PROBE AMP, GENITAL
Chlamydia, DNA Probe: NEGATIVE
GC Probe Amp, Genital: NEGATIVE

## 2010-12-16 LAB — WET PREP, GENITAL
Clue Cells Wet Prep HPF POC: NONE SEEN
WBC, Wet Prep HPF POC: NONE SEEN

## 2012-01-25 ENCOUNTER — Emergency Department (INDEPENDENT_AMBULATORY_CARE_PROVIDER_SITE_OTHER)
Admission: EM | Admit: 2012-01-25 | Discharge: 2012-01-25 | Disposition: A | Discharge status: Home / Self Care | Payer: Self-pay | Source: Home / Self Care | Attending: Emergency Medicine | Admitting: Emergency Medicine

## 2012-01-25 ENCOUNTER — Encounter (HOSPITAL_COMMUNITY): Payer: Self-pay | Admitting: *Deleted

## 2012-01-25 DIAGNOSIS — K047 Periapical abscess without sinus: Secondary | ICD-10-CM

## 2012-01-25 MED ORDER — AMOXICILLIN 500 MG PO CAPS: 500.0000 mg | ORAL_CAPSULE | Freq: Three times a day (TID) | ORAL | Status: DC

## 2012-01-25 MED ORDER — HYDROCODONE-ACETAMINOPHEN 5-325 MG PO TABS: ORAL_TABLET | ORAL | Status: DC

## 2012-01-25 NOTE — ED Notes (Signed)
Dr. Lorenz Coaster used 5 ml. Viscous Lidocaine to numb abscess in mouth before I and D.

## 2012-01-25 NOTE — ED Provider Notes (Signed)
Chief Complaint  Patient presents with  . Dental Pain    History of Present Illness:   The patient is a 26 year old female who presents with a two-week history of pain in the left upper first bicuspid. This is a severe and throbbing pain rated 11/10 in intensity. Today she has a swollen place in her gown just above the bicuspid. It's not draining any pus. There is to chew on that side she has no difficulty swallowing or breathing. She denies any fever, although she has had some chills. She's had no chest pain, shortness of breath, or coughing.  Review of Systems:  Other than noted above, the patient denies any of the following symptoms: Systemic:  No fever, chills, sweats or weight loss. ENT:  No headache, ear ache, sore throat, nasal congestion, facial pain, or swelling. Lymphatic:  No adenopathy. Lungs:  No coughing, wheezing or shortness of breath.  PMFSH:  Past medical history, family history, social history, meds, and allergies were reviewed.  Physical Exam:   Vital signs:  BP 109/69  Pulse 71  Temp 99.3 F (37.4 C) (Oral)  Resp 16  SpO2 100%  LMP 01/06/2012 General:  Alert, oriented, in no distress. ENT:  TMs and canals normal.  Nasal mucosa normal. Mouth exam:  Her teeth look in fairly good shape. She has fillings almost all her teeth but there is no obvious dental decay. The left upper first bicuspid was tender to touch. There is a swollen area at the root of this tooth which appears to have some pus pointing towards the surface. Neck:  No swelling or adenopathy. Lungs:  Breath sounds clear and equal bilaterally.  No wheezes, rales or rhonchi. Heart:  Regular rhythm.  No gallops or murmers. Skin:  Clear, warm and dry.  Procedure Note:  Verbal informed consent was obtained from the patient.  Risks and benefits were outlined with the patient.  Patient understands and accepts these risks.  Identity of the patient was confirmed verbally and by armband.    Procedure was performed  as followed:  The area was anesthetized with application of a small amount of viscous Xylocaine. A single incision was made into the area with a #11 scalpel blade yielding a few drops of pus. Patient tolerated this procedure well. Bleeding was controlled with steady pressure with a gauze pad.  Patient tolerated the procedure well without any immediate complications.  Assessment:  The encounter diagnosis was Dental abscess.  Plan:   1.  The following meds were prescribed:   New Prescriptions   AMOXICILLIN (AMOXIL) 500 MG CAPSULE    Take 1 capsule (500 mg total) by mouth 3 (three) times daily.   HYDROCODONE-ACETAMINOPHEN (NORCO/VICODIN) 5-325 MG PER TABLET    1 to 2 tabs every 4 to 6 hours as needed for pain.   2.  The patient was instructed in symptomatic care and handouts were given. 3.  The patient was told to return if becoming worse in any way, if no better in 3 or 4 days, and given some red flag symptoms that would indicate earlier return, especially difficulty breathing. 4.  The patient was told to follow up with a dentist as soon as possible.    [name], MD 01/25/12 [phone]

## 2012-01-25 NOTE — ED Notes (Signed)
Bed:UC09<BR> Expected date:<BR> Expected time:<BR> Means of arrival:<BR> Comments:<BR>

## 2012-01-25 NOTE — ED Notes (Signed)
C/o L upper jaw toothache and swelling to L side of face onset this AM.  Face was sore when she rubbed it for the past 2 weeks.

## 2012-02-16 ENCOUNTER — Emergency Department (HOSPITAL_COMMUNITY)
Admission: EM | Admit: 2012-02-16 | Discharge: 2012-02-16 | Disposition: A | Discharge status: Home / Self Care | Payer: Self-pay | Attending: Emergency Medicine | Admitting: Emergency Medicine

## 2012-02-16 ENCOUNTER — Encounter (HOSPITAL_COMMUNITY): Payer: Self-pay | Admitting: Emergency Medicine

## 2012-02-16 DIAGNOSIS — Z791 Long term (current) use of non-steroidal anti-inflammatories (NSAID): Secondary | ICD-10-CM | POA: Insufficient documentation

## 2012-02-16 DIAGNOSIS — Z79899 Other long term (current) drug therapy: Secondary | ICD-10-CM | POA: Insufficient documentation

## 2012-02-16 DIAGNOSIS — R51 Headache: Secondary | ICD-10-CM | POA: Insufficient documentation

## 2012-02-16 DIAGNOSIS — F17200 Nicotine dependence, unspecified, uncomplicated: Secondary | ICD-10-CM | POA: Insufficient documentation

## 2012-02-16 DIAGNOSIS — R42 Dizziness and giddiness: Secondary | ICD-10-CM | POA: Insufficient documentation

## 2012-02-16 MED ORDER — METOCLOPRAMIDE HCL 5 MG/ML IJ SOLN
10.0000 mg | Freq: Once | INTRAMUSCULAR | Status: AC
  Administered 2012-02-16: 10 mg via INTRAVENOUS
  Filled 2012-02-16: qty 2

## 2012-02-16 MED ORDER — SODIUM CHLORIDE 0.9 % IV SOLN
Freq: Once | INTRAVENOUS | Status: AC
  Administered 2012-02-16: 10:00:00 via INTRAVENOUS

## 2012-02-16 MED ORDER — KETOROLAC TROMETHAMINE 30 MG/ML IJ SOLN
30.0000 mg | Freq: Once | INTRAMUSCULAR | Status: AC
  Administered 2012-02-16: 30 mg via INTRAVENOUS
  Filled 2012-02-16: qty 1

## 2012-02-16 MED ORDER — ONDANSETRON 4 MG PO TBDP: 4.0000 mg | ORAL_TABLET | Freq: Three times a day (TID) | ORAL | Status: DC | PRN

## 2012-02-16 MED ORDER — PROMETHAZINE HCL 25 MG PO TABS: 25.0000 mg | ORAL_TABLET | Freq: Four times a day (QID) | ORAL | Status: DC | PRN

## 2012-02-16 MED ORDER — DIPHENHYDRAMINE HCL 50 MG/ML IJ SOLN
25.0000 mg | Freq: Once | INTRAMUSCULAR | Status: AC
  Administered 2012-02-16: 25 mg via INTRAVENOUS
  Filled 2012-02-16: qty 1

## 2012-02-16 MED ORDER — PROMETHAZINE HCL 25 MG PO TABS
25.0000 mg | ORAL_TABLET | Freq: Four times a day (QID) | ORAL | Status: DC | PRN
Start: 2012-02-16 — End: 2012-03-18

## 2012-02-16 NOTE — ED Provider Notes (Signed)
History     CSN: [id number]  Arrival date & time 02/16/12  7829   First MD Initiated Contact with Patient 02/16/12 [phone]      Chief Complaint  Patient presents with  . Nausea  . Dizziness    (Consider location/radiation/quality/duration/timing/severity/associated sxs/prior treatment) Patient is a 26 y.o. female presenting with headaches. The history is provided by the patient. No language interpreter was used.  Headache  This is a new problem. The problem occurs constantly. The problem has been gradually worsening. The headache is associated with nothing. The pain is located in the frontal region. The pain is at a severity of 7/10. The pain is severe. The pain does not radiate. Associated symptoms include nausea and vomiting. She has tried nothing for the symptoms.  Pt complains of a headache and vomiting.   Pt denies fever or chills no cough, no abdominal pain.  Pt denies pregnancy risk  History reviewed. No pertinent past medical history.  Past Surgical History  Procedure Date  . Cesarean section   . Appendectomy   . Wisdom tooth extraction     No family history on file.  History  Substance Use Topics  . Smoking status: Current Every Day Smoker -- 0.5 packs/day  . Smokeless tobacco: Not on file  . Alcohol Use: No    OB History    Grav Para Term Preterm Abortions TAB SAB Ect Mult Living                  Review of Systems  Gastrointestinal: Positive for nausea and vomiting.  Neurological: Positive for headaches.  All other systems reviewed and are negative.    Allergies  Bactrim; Flagyl; Lubricants; and Penicillins  Home Medications   Current Outpatient Rx  Name  Route  Sig  Dispense  Refill  . ACETAMINOPHEN 500 MG PO TABS   Oral   Take 500 mg by mouth every 6 (six) hours as needed. For pain         . IBUPROFEN 800 MG PO TABS   Oral   Take 800 mg by mouth every 8 (eight) hours as needed. For pain         . NAPROXEN SODIUM 220 MG PO TABS   Oral   Take 220 mg by mouth 2 (two) times daily as needed. For pain           BP 115/68  Pulse 81  Temp 98 F (36.7 C) (Oral)  Resp 18  Ht 5\' 3"  (1.6 m)  Wt 143 lb (64.864 kg)  BMI 25.33 kg/m2  SpO2 98%  LMP 01/06/2012  Physical Exam  Nursing note and vitals reviewed. Constitutional: She appears well-developed and well-nourished.  HENT:  Head: Normocephalic and atraumatic.  Right Ear: External ear normal.  Left Ear: External ear normal.  Nose: Nose normal.  Mouth/Throat: Oropharynx is clear and moist.  Eyes: Conjunctivae normal and EOM are normal. Pupils are equal, round, and reactive to light.  Neck: Normal range of motion. Neck supple.  Cardiovascular: Normal rate.   Pulmonary/Chest: Effort normal.  Abdominal: Soft.  Musculoskeletal: Normal range of motion.  Neurological: She is alert.  Skin: Skin is warm.    ED Course  Procedures (including critical care time)  Labs Reviewed - No data to display No results found.   No diagnosis found.    MDM  Pt given Iv fluids,  Ns x 1 liter,  Torodol, reglan and benadryl,  Pt able to tolerte po fluids.   Pt  given rx for phenergan        Lonia Skinner Mor[place], [place] 02/16/12 1029

## 2012-02-16 NOTE — ED Notes (Signed)
Nausea and dizzyness since last night, no diarrhea, no fever, also c/o chills and weakness, HA and back pain, no dysuria, NAD

## 2012-02-16 NOTE — ED Provider Notes (Signed)
Medical screening examination/treatment/procedure(s) were performed by non-physician practitioner and as supervising physician I was immediately available for consultation/collaboration.  Marwan T Powers, MD 02/16/12 1502 

## 2012-03-18 ENCOUNTER — Emergency Department (HOSPITAL_COMMUNITY): Payer: Self-pay

## 2012-03-18 ENCOUNTER — Encounter (HOSPITAL_COMMUNITY): Payer: Self-pay | Admitting: Emergency Medicine

## 2012-03-18 ENCOUNTER — Emergency Department (HOSPITAL_COMMUNITY)
Admission: EM | Admit: 2012-03-18 | Discharge: 2012-03-18 | Disposition: A | Discharge status: Home Health | Payer: Self-pay | Attending: Emergency Medicine | Admitting: Emergency Medicine

## 2012-03-18 DIAGNOSIS — R0789 Other chest pain: Secondary | ICD-10-CM

## 2012-03-18 DIAGNOSIS — R05 Cough: Secondary | ICD-10-CM | POA: Insufficient documentation

## 2012-03-18 DIAGNOSIS — F17200 Nicotine dependence, unspecified, uncomplicated: Secondary | ICD-10-CM | POA: Insufficient documentation

## 2012-03-18 DIAGNOSIS — R059 Cough, unspecified: Secondary | ICD-10-CM | POA: Insufficient documentation

## 2012-03-18 DIAGNOSIS — R062 Wheezing: Secondary | ICD-10-CM | POA: Insufficient documentation

## 2012-03-18 DIAGNOSIS — M549 Dorsalgia, unspecified: Secondary | ICD-10-CM | POA: Insufficient documentation

## 2012-03-18 DIAGNOSIS — R071 Chest pain on breathing: Secondary | ICD-10-CM | POA: Insufficient documentation

## 2012-03-18 LAB — RAPID STREP SCREEN (MED CTR MEBANE ONLY): Streptococcus, Group A Screen (Direct): NEGATIVE

## 2012-03-18 MED ORDER — DEXTROMETHORPHAN POLISTIREX 30 MG/5ML PO LQCR
10.0000 mL | Freq: Once | ORAL | Status: AC
  Administered 2012-03-18: 60 mg via ORAL
  Filled 2012-03-18: qty 10

## 2012-03-18 MED ORDER — TRAMADOL HCL 50 MG PO TABS: 50.0000 mg | ORAL_TABLET | Freq: Four times a day (QID) | ORAL | Status: DC | PRN

## 2012-03-18 MED ORDER — DEXTROMETHORPHAN HBR 15 MG/5ML PO SYRP: 10.0000 mL | ORAL_SOLUTION | Freq: Four times a day (QID) | ORAL | Status: DC | PRN

## 2012-03-18 NOTE — ED Provider Notes (Signed)
Medical screening examination/treatment/procedure(s) were performed by non-physician practitioner and as supervising physician I was immediately available for consultation/collaboration. Devoria Albe, MD, Armando Gang   Ward Givens, MD 03/18/12 1145

## 2012-03-18 NOTE — ED Notes (Signed)
Pt presents to ED with c/o chest pain and back pain. Pt reports cough for about a week. NAD

## 2012-03-18 NOTE — ED Provider Notes (Signed)
History     CSN: [id number]  Arrival date & time 03/18/12  5621   First MD Initiated Contact with Patient 03/18/12 [phone]      Chief Complaint  Patient presents with  . Chest Pain    (Consider location/radiation/quality/duration/timing/severity/associated sxs/prior treatment) HPI Comments: Patient is a 27 year old female who presents with a 3 day history of chest pain. The chest pain started gradually and progressively worsened since the onset. Patient reports a 1 week history of productive cough with green phlegm prior to developing the chest pain. The pain is located in her generalized chest. The pain is reproducible with palpation and coughing. The pain does not radiate. Patient has tried ibuprofen for pain and theraflu which provides some relief. Other associated symptoms include sore throat. No alleviating factors.    History reviewed. No pertinent past medical history.  Past Surgical History  Procedure Date  . Cesarean section   . Appendectomy   . Wisdom tooth extraction     History reviewed. No pertinent family history.  History  Substance Use Topics  . Smoking status: Current Every Day Smoker -- 0.5 packs/day  . Smokeless tobacco: Not on file  . Alcohol Use: No    OB History    Grav Para Term Preterm Abortions TAB SAB Ect Mult Living                  Review of Systems  Respiratory: Positive for cough.   Cardiovascular: Positive for chest pain.  Musculoskeletal: Positive for back pain.  All other systems reviewed and are negative.    Allergies  Bactrim; Flagyl; Lubricants; and Penicillins  Home Medications   Current Outpatient Rx  Name  Route  Sig  Dispense  Refill  . IBUPROFEN 200 MG PO TABS   Oral   Take 400 mg by mouth every 6 (six) hours as needed. For pain.         Marland Kitchen PHENYLEPHRINE-PHENIRAMINE-DM 12-26-18 MG PO PACK   Oral   Take 1 packet by mouth daily as needed. For cold symptoms           BP 107/74  Pulse 87  Temp 99.2 F (37.3 C)  (Oral)  Resp 20  SpO2 100%  LMP 03/02/2012  Physical Exam  Nursing note and vitals reviewed. Constitutional: She is oriented to person, place, and time. She appears well-developed and well-nourished. No distress.  HENT:  Head: Normocephalic and atraumatic.  Eyes: Conjunctivae normal are normal.  Neck: Normal range of motion. Neck supple.  Cardiovascular: Normal rate and regular rhythm.  Exam reveals no gallop and no friction rub.   No murmur heard. Pulmonary/Chest: Effort normal. She has wheezes. She has no rales. She exhibits tenderness.       Central chest tenderness. Occasional expiratory wheezes noted throughout bilateral lung fields.   Abdominal: Soft. She exhibits no distension. There is no tenderness. There is no rebound.  Musculoskeletal: Normal range of motion.  Neurological: She is alert and oriented to person, place, and time. Coordination normal.       Speech is goal-oriented. Moves limbs without ataxia.   Skin: Skin is warm and dry.  Psychiatric: She has a normal mood and affect. Her behavior is normal.    ED Course  Procedures (including critical care time)   Date: 03/18/2012  Rate: 84  Rhythm: normal sinus rhythm  QRS Axis: normal  Intervals: normal  ST/T Wave abnormalities: normal  Conduction Disutrbances:none  Narrative Interpretation: NSR unchanged from previous  Old  EKG Reviewed: unchanged     Labs Reviewed  RAPID STREP SCREEN   Dg Chest 2 View  03/18/2012  *RADIOLOGY REPORT*  Clinical Data: Chest pain  CHEST - 2 VIEW  Comparison: 11/26/2009  Findings: The heart size and mediastinal contours are within normal limits.  Both lungs are clear.  The visualized skeletal structures are unremarkable.  IMPRESSION: Negative exam.   Original Report Authenticated By: Signa Kell, M.D.      1. Chest wall pain       MDM  10:43 AM Chest xray and rapid strep pending. Patient will have dextromethorphan for cough. Patient is PERC negative.   10:56 AM Chest  xray unremarkable.   11:26 AM Strep test negative. Patient will be discharged with pain medication for chest pain and dextromethorphan for cough. Patient afebrile and non toxic appearing. Vitals stable. No further evaluation needed at this time. Patient agreeable to plan.     Emilia Beck, PA-C 03/18/12 1130

## 2012-04-11 ENCOUNTER — Emergency Department (INDEPENDENT_AMBULATORY_CARE_PROVIDER_SITE_OTHER)
Admission: EM | Admit: 2012-04-11 | Discharge: 2012-04-11 | Disposition: A | Discharge status: Home / Self Care | Payer: Self-pay | Source: Home / Self Care | Attending: Emergency Medicine | Admitting: Emergency Medicine

## 2012-04-11 ENCOUNTER — Encounter (HOSPITAL_COMMUNITY): Payer: Self-pay | Admitting: Emergency Medicine

## 2012-04-11 ENCOUNTER — Emergency Department (INDEPENDENT_AMBULATORY_CARE_PROVIDER_SITE_OTHER): Payer: Self-pay

## 2012-04-11 DIAGNOSIS — S161XXA Strain of muscle, fascia and tendon at neck level, initial encounter: Secondary | ICD-10-CM

## 2012-04-11 DIAGNOSIS — IMO0002 Reserved for concepts with insufficient information to code with codable children: Secondary | ICD-10-CM

## 2012-04-11 DIAGNOSIS — S139XXA Sprain of joints and ligaments of unspecified parts of neck, initial encounter: Secondary | ICD-10-CM

## 2012-04-11 DIAGNOSIS — S46019A Strain of muscle(s) and tendon(s) of the rotator cuff of unspecified shoulder, initial encounter: Secondary | ICD-10-CM

## 2012-04-11 MED ORDER — METHOCARBAMOL 500 MG PO TABS: 500.0000 mg | ORAL_TABLET | Freq: Three times a day (TID) | ORAL | Status: DC

## 2012-04-11 MED ORDER — TRAMADOL HCL 50 MG PO TABS: 100.0000 mg | ORAL_TABLET | Freq: Three times a day (TID) | ORAL | Status: DC | PRN

## 2012-04-11 MED ORDER — NAPROXEN 500 MG PO TABS: 500.0000 mg | ORAL_TABLET | Freq: Two times a day (BID) | ORAL | Status: DC
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
## 2012-04-12 NOTE — ED Notes (Signed)
Work note

## 2012-08-11 ENCOUNTER — Emergency Department (HOSPITAL_COMMUNITY)
Admission: EM | Admit: 2012-08-11 | Discharge: 2012-08-11 | Disposition: A | Discharge status: Home / Self Care | Payer: Self-pay | Attending: Emergency Medicine | Admitting: Emergency Medicine

## 2012-08-11 ENCOUNTER — Encounter (HOSPITAL_COMMUNITY): Payer: Self-pay | Admitting: Emergency Medicine

## 2012-08-11 DIAGNOSIS — Z3202 Encounter for pregnancy test, result negative: Secondary | ICD-10-CM | POA: Insufficient documentation

## 2012-08-11 DIAGNOSIS — R509 Fever, unspecified: Secondary | ICD-10-CM | POA: Insufficient documentation

## 2012-08-11 DIAGNOSIS — M25559 Pain in unspecified hip: Secondary | ICD-10-CM | POA: Insufficient documentation

## 2012-08-11 DIAGNOSIS — F17200 Nicotine dependence, unspecified, uncomplicated: Secondary | ICD-10-CM | POA: Insufficient documentation

## 2012-08-11 DIAGNOSIS — R10819 Abdominal tenderness, unspecified site: Secondary | ICD-10-CM | POA: Insufficient documentation

## 2012-08-11 DIAGNOSIS — R52 Pain, unspecified: Secondary | ICD-10-CM | POA: Insufficient documentation

## 2012-08-11 DIAGNOSIS — N390 Urinary tract infection, site not specified: Secondary | ICD-10-CM | POA: Insufficient documentation

## 2012-08-11 DIAGNOSIS — Z880 Allergy status to penicillin: Secondary | ICD-10-CM | POA: Insufficient documentation

## 2012-08-11 LAB — COMPREHENSIVE METABOLIC PANEL
Albumin: 3.7 g/dL (ref 3.5–5.2)
Alkaline Phosphatase: 46 U/L (ref 39–117)
BUN: 9 mg/dL (ref 6–23)
CO2: 22 mEq/L (ref 19–32)
Chloride: 106 mEq/L (ref 96–112)
Creatinine, Ser: 0.61 mg/dL (ref 0.50–1.10)
GFR calc Af Amer: >90 mL/min (ref >90)
GFR calc non Af Amer: >90 mL/min (ref >90)
Glucose, Bld: 104 mg/dL — ABNORMAL HIGH (ref 70–99)
Potassium: 3.7 mEq/L (ref 3.5–5.1)
Total Bilirubin: 0.3 mg/dL (ref 0.3–1.2)

## 2012-08-11 LAB — CBC WITH DIFFERENTIAL/PLATELET
HCT: 34.1 % — ABNORMAL LOW (ref 36.0–46.0)
Hemoglobin: 12.1 g/dL (ref 12.0–15.0)
Lymphocytes Relative: 7 % — ABNORMAL LOW (ref 12–46)
Lymphs Abs: 0.5 10*3/uL — ABNORMAL LOW (ref 0.7–4.0)
Monocytes Absolute: 0 10*3/uL — ABNORMAL LOW (ref 0.1–1.0)
Monocytes Relative: 0 % — ABNORMAL LOW (ref 3–12)
Neutro Abs: 6.1 10*3/uL (ref 1.7–7.7)
Neutrophils Relative %: 91 % — ABNORMAL HIGH (ref 43–77)
RBC: 3.61 MIL/uL — ABNORMAL LOW (ref 3.87–5.11)

## 2012-08-11 LAB — URINE MICROSCOPIC-ADD ON

## 2012-08-11 LAB — URINALYSIS, ROUTINE W REFLEX MICROSCOPIC
Glucose, UA: NEGATIVE mg/dL
Ketones, ur: NEGATIVE mg/dL
Protein, ur: NEGATIVE mg/dL
Urobilinogen, UA: 0.2 mg/dL (ref 0.0–1.0)

## 2012-08-11 MED ORDER — HYDROMORPHONE HCL PF 1 MG/ML IJ SOLN
0.5000 mg | Freq: Once | INTRAMUSCULAR | Status: AC
  Administered 2012-08-11: 0.5 mg via INTRAVENOUS
  Filled 2012-08-11: qty 1

## 2012-08-11 MED ORDER — CIPROFLOXACIN HCL 500 MG PO TABS: 500.0000 mg | ORAL_TABLET | Freq: Two times a day (BID) | ORAL | Status: DC

## 2012-08-11 MED ORDER — ONDANSETRON HCL 4 MG/2ML IJ SOLN
4.0000 mg | Freq: Once | INTRAMUSCULAR | Status: AC
  Administered 2012-08-11: 4 mg via INTRAVENOUS
  Filled 2012-08-11: qty 2

## 2012-08-11 MED ORDER — SODIUM CHLORIDE 0.9 % IV BOLUS (SEPSIS)
1000.0000 mL | Freq: Once | INTRAVENOUS | Status: AC
  Administered 2012-08-11: 1000 mL via INTRAVENOUS

## 2012-08-11 NOTE — ED Provider Notes (Signed)
History     CSN: [id number]  Arrival date & time 08/11/12  1002   First MD Initiated Contact with Patient 08/11/12 1037      Chief Complaint  Patient presents with  . Nausea  . Emesis  . Generalized Body Aches    (Consider location/radiation/quality/duration/timing/severity/associated sxs/prior treatment) HPI Brandy Ferguson is a 27 y/o F presenting to the ED with hip pain that started yesterday described as a constant, throbbing sensation - denied injury. Stated that she been experiencing generalized bodyaches that started yesterday - stated that it hurts every time she moves. Stated that she has been feeling nauseous starting this morning at 8:00AM, approximately - stated that she had 3 episodes of emesis and numerous episodes of dry-heaving. Patient stated that she feels like she is getting a "kidney infection" because this is how she normally presents. Denied abdominal pain, dysuria, diarrhea, sick constants, numbness tingling, chest pain, shortness of breath, difficulty breathing, headache dizziness.   History reviewed. No pertinent past medical history.  Past Surgical History  Procedure Laterality Date  . Cesarean section    . Appendectomy    . Wisdom tooth extraction      History reviewed. No pertinent family history.  History  Substance Use Topics  . Smoking status: Current Every Day Smoker -- 0.50 packs/day  . Smokeless tobacco: Not on file  . Alcohol Use: No    OB History   Grav Para Term Preterm Abortions TAB SAB Ect Mult Living                  Review of Systems  Constitutional: Positive for fever (low grade). Negative for appetite change and fatigue.  HENT: Negative for congestion, sore throat, trouble swallowing, neck pain and neck stiffness.   Eyes: Negative for pain and visual disturbance.  Respiratory: Negative for cough, chest tightness and shortness of breath.   Cardiovascular: Negative for chest pain.  Gastrointestinal: Positive for nausea and  vomiting. Negative for abdominal pain, diarrhea, constipation and blood in stool.  Genitourinary: Negative for dysuria, difficulty urinating and pelvic pain.  Musculoskeletal: Positive for myalgias (left thigh pain). Negative for back pain.  Skin: Negative for rash.  Neurological: Negative for dizziness, weakness, light-headedness, numbness and headaches.  All other systems reviewed and are negative.    Allergies  Bactrim; Flagyl; Lubricants; and Penicillins  Home Medications   Current Outpatient Rx  Name  Route  Sig  Dispense  Refill  . Naproxen Sodium (ALEVE PO)   Oral   Take 1 tablet by mouth 2 (two) times daily as needed. For pain         . ciprofloxacin (CIPRO) 500 MG tablet   Oral   Take 1 tablet (500 mg total) by mouth 2 (two) times daily.   14 tablet   0     BP 95/54  Pulse 75  Temp(Src) 99.1 F (37.3 C) (Oral)  Resp 16  SpO2 100%  LMP 08/06/2012  Physical Exam  Nursing note and vitals reviewed. Constitutional: She is oriented to person, place, and time. She appears well-developed and well-nourished. No distress.  HENT:  Head: Normocephalic and atraumatic.  Eyes: Conjunctivae and EOM are normal. Pupils are equal, round, and reactive to light. Right eye exhibits no discharge. Left eye exhibits no discharge.  Neck: Normal range of motion. Neck supple.  Negative neck stiffness Negative nuchal rigidity Negative lymphadenopathy  Cardiovascular: Normal rate, regular rhythm and normal heart sounds.  Exam reveals no friction rub.   No murmur  heard. Pulses:      Radial pulses are 2+ on the right side, and 2+ on the left side.       Dorsalis pedis pulses are 2+ on the right side, and 2+ on the left side.  Pulmonary/Chest: Effort normal and breath sounds normal. No respiratory distress. She has no wheezes. She has no rales.  Abdominal: Soft. Bowel sounds are normal. She exhibits no distension. There is no hepatosplenomegaly. There is tenderness in the suprapubic  area. There is no rebound and no guarding. No hernia.    Musculoskeletal: Normal range of motion. She exhibits no edema and no tenderness.  Full ROM to the lower extremities bilaterally Strength 5+/5+ to lower extremities bilaterally  Lymphadenopathy:    She has no cervical adenopathy.  Neurological: She is alert and oriented to person, place, and time. No cranial nerve deficit. She exhibits normal muscle tone. Coordination normal.  Skin: Skin is warm and dry. No rash noted. She is not diaphoretic. No erythema.  Psychiatric: She has a normal mood and affect. Her behavior is normal. Thought content normal.    ED Course  Procedures (including critical care time)  Medications  ondansetron (ZOFRAN) injection 4 mg (4 mg Intravenous Given 08/11/12 1044)  sodium chloride 0.9 % bolus 1,000 mL (0 mLs Intravenous Stopped 08/11/12 1254)  HYDROmorphone (DILAUDID) injection 0.5 mg (0.5 mg Intravenous Given 08/11/12 1205)    Labs Reviewed  CBC WITH DIFFERENTIAL - Abnormal; Notable for the following:    RBC 3.61 (*)    HCT 34.1 (*)    Platelets 123 (*)    Neutrophils Relative % 91 (*)    Lymphocytes Relative 7 (*)    Lymphs Abs 0.5 (*)    Monocytes Relative 0 (*)    Monocytes Absolute 0.0 (*)    All other components within normal limits  COMPREHENSIVE METABOLIC PANEL - Abnormal; Notable for the following:    Glucose, Bld 104 (*)    AST 40 (*)    All other components within normal limits  URINALYSIS, ROUTINE W REFLEX MICROSCOPIC - Abnormal; Notable for the following:    Hgb urine dipstick MODERATE (*)    Nitrite POSITIVE (*)    Leukocytes, UA SMALL (*)    All other components within normal limits  URINE MICROSCOPIC-ADD ON - Abnormal; Notable for the following:    Bacteria, UA FEW (*)    All other components within normal limits  URINE CULTURE  POCT PREGNANCY, URINE   No results found.   1. UTI (urinary tract infection)       MDM  Negative acute abdomen, negative peritoneal signs.  Mild discomfort upon palpation to the suprapubic region.  Negative urine pregnancy test noted. Positive UTI noted.  Less likely to be pyelonephritis - negative elevation in WBC, WBC in urine negative elevation, negative proteins in urine.  Moderate blood in urine - patient is currently menstruating.  Pain controlled in ED setting. Thigh discomfort suspicion to be musculoskeletal in nature, etiology unknown. Discussed case with Dr. Berlinda Last - stated that imaging was not needed that patient was less likely to have a pyelonephritis - cleared patient for discharge. Discharge patient with antibiotics. Discussed with patient to rest and stay hydrated. Referred patient to [facility]. Discussed with patient to monitor symptoms and if symptoms are to worsen or change to report back to the ED - strict return instructions given.  Patient agreed to plan of care, understood, all questions answered.   [employer],  PA-C 08/11/12 1707  Makeyla Govan, PA-C 08/11/12 1721

## 2012-08-11 NOTE — ED Notes (Signed)
Started having body aches -- left arm and leg around 8am, then developed chills, and vomited 4 times prior to coming to ED-- no diarrhea

## 2012-08-11 NOTE — ED Notes (Signed)
States period is heavier than normal- started 6/1-- still having period. Normally last 4-5 days.

## 2012-08-12 NOTE — ED Provider Notes (Signed)
Medical screening examination/treatment/procedure(s) were performed by non-physician practitioner and as supervising physician I was immediately available for consultation/collaboration.   Greysen Devino, MD 08/12/12 0745 

## 2012-08-13 LAB — URINE CULTURE: Colony Count: >=100000

## 2012-08-14 NOTE — ED Notes (Signed)
Post ED Visit - Positive Culture Follow-up  Culture report reviewed by antimicrobial stewardship pharmacist: []  Wes Dulaney, Pharm.D., BCPS [x]  Celedonio Miyamoto, Pharm.D., BCPS []  Georgina Pillion, [address].D., BCPS []  [place], [place].D., BCPS, AAHIVP []  Estella Husk, Pharm.D., BCPS, AAHIVP  Positive urine culture  no further patient follow-up is required at this time.  Larena Sox 08/14/2012, 4:38 PM

## 2013-01-11 ENCOUNTER — Other Ambulatory Visit: Payer: Self-pay

## 2014-07-17 ENCOUNTER — Encounter (HOSPITAL_COMMUNITY): Payer: Self-pay | Admitting: *Deleted

## 2014-07-17 ENCOUNTER — Inpatient Hospital Stay (HOSPITAL_COMMUNITY)
Admission: AD | Admit: 2014-07-17 | Discharge: 2014-07-17 | Disposition: A | Discharge status: Home / Self Care | Payer: 59 | Source: Ambulatory Visit | Attending: Obstetrics & Gynecology | Admitting: Obstetrics & Gynecology

## 2014-07-17 DIAGNOSIS — O210 Mild hyperemesis gravidarum: Secondary | ICD-10-CM | POA: Diagnosis present

## 2014-07-17 DIAGNOSIS — Z3A01 Less than 8 weeks gestation of pregnancy: Secondary | ICD-10-CM | POA: Diagnosis not present

## 2014-07-17 DIAGNOSIS — O219 Vomiting of pregnancy, unspecified: Secondary | ICD-10-CM

## 2014-07-17 DIAGNOSIS — Z87891 Personal history of nicotine dependence: Secondary | ICD-10-CM | POA: Diagnosis not present

## 2014-07-17 LAB — URINALYSIS, ROUTINE W REFLEX MICROSCOPIC
BILIRUBIN URINE: NEGATIVE
Glucose, UA: NEGATIVE mg/dL
HGB URINE DIPSTICK: NEGATIVE
Ketones, ur: 40 mg/dL — AB
Leukocytes, UA: NEGATIVE
Nitrite: NEGATIVE
PH: 6 (ref 5.0–8.0)
Protein, ur: 30 mg/dL — AB
Urobilinogen, UA: 0.2 mg/dL (ref 0.0–1.0)

## 2014-07-17 LAB — POCT PREGNANCY, URINE: PREG TEST UR: POSITIVE — AB

## 2014-07-17 LAB — URINE MICROSCOPIC-ADD ON

## 2014-07-17 MED ORDER — PROMETHAZINE HCL 25 MG/ML IJ SOLN
25.0000 mg | Freq: Once | INTRAVENOUS | Status: AC
  Administered 2014-07-17: 25 mg via INTRAVENOUS
  Filled 2014-07-17: qty 1

## 2014-07-17 MED ORDER — GI COCKTAIL ~~LOC~~
30.0000 mL | Freq: Once | ORAL | Status: AC
  Administered 2014-07-17: 30 mL via ORAL
  Filled 2014-07-17: qty 30

## 2014-07-17 MED ORDER — FAMOTIDINE IN NACL 20-0.9 MG/50ML-% IV SOLN
20.0000 mg | Freq: Once | INTRAVENOUS | Status: AC
  Administered 2014-07-17: 20 mg via INTRAVENOUS
  Filled 2014-07-17: qty 50

## 2014-07-17 MED ORDER — PROMETHAZINE HCL 25 MG PO TABS: 12.5000 mg | ORAL_TABLET | Freq: Four times a day (QID) | ORAL | Status: DC | PRN

## 2014-07-17 NOTE — Discharge Instructions (Signed)

## 2014-07-17 NOTE — MAU Provider Note (Signed)
History     CSN: [id number]  Arrival date and time: 07/17/14 1751   None     No chief complaint on file.  Emesis  This is a new problem. The current episode started in the past 7 days. The problem occurs 5 to 10 times per day. The problem has been unchanged. There has been no fever. Associated symptoms include diarrhea and dizziness. Pertinent negatives include no abdominal pain, chest pain (but has tightness), chills, fever, headaches or myalgias. She has tried nothing for the symptoms.   This is a 29 y.o. female at 9244w2d who presents with 3 day history of vomiting and chest tightness. Has had some loose stools. Thinks chest tightness is related to vomiting.   RN Note:  Expand All Collapse All   Been throwing up for 3 days straight, can't eat or drink. Dizzy and chest is tight. Feels like she can't breath, but knows she is          OB History    Gravida Para Term Preterm AB TAB SAB Ectopic Multiple Living   2 1        1       History reviewed. No pertinent past medical history.  Past Surgical History  Procedure Laterality Date  . Cesarean section    . Appendectomy    . Wisdom tooth extraction      History reviewed. No pertinent family history.  History  Substance Use Topics  . Smoking status: Former Smoker -- 0.50 packs/day  . Smokeless tobacco: Not on file  . Alcohol Use: No    Allergies:  Allergies  Allergen Reactions  . Bactrim [Sulfamethoxazole-Trimethoprim] Hives  . Flagyl [Metronidazole] Hives  . Lubricants Hives    KY jelly causes itching and hives  . Penicillins Other (See Comments)    From record as child.  Has had Amoxicillin as an adult without a reaction.    Prescriptions prior to admission  Medication Sig Dispense Refill Last Dose  . chlorhexidine (PERIDEX) 0.12 % solution Use as directed 15 mLs in the mouth or throat 2 (two) times daily. Rinse with solution twice daily.   Past Week at Unknown time  . Prenatal Vit-Fe Fumarate-FA (PRENATAL  MULTIVITAMIN) TABS tablet Take 1 tablet by mouth daily at 12 noon.   07/16/2014 at Unknown time  . ciprofloxacin (CIPRO) 500 MG tablet Take 1 tablet (500 mg total) by mouth 2 (two) times daily. (Patient not taking: Reported on 07/17/2014) 14 tablet 0 Completed Course at Unknown time    Review of Systems  Constitutional: Positive for malaise/fatigue. Negative for fever and chills.  Cardiovascular: Negative for chest pain (but has tightness).  Gastrointestinal: Positive for nausea, vomiting and diarrhea. Negative for abdominal pain and constipation.  Genitourinary: Negative for dysuria.  Musculoskeletal: Negative for myalgias and back pain.  Neurological: Positive for dizziness and weakness. Negative for headaches.   Physical Exam   Blood pressure 119/79, pulse 78, temperature 97.5 F (36.4 C), temperature source Axillary, resp. rate 20, height 5\' 2"  (1.575 m), weight 126 lb (57.153 kg), last menstrual period 06/03/2014.  Physical Exam  Constitutional: She is oriented to person, place, and time. She appears well-developed and well-nourished. No distress.  HENT:  Head: Normocephalic.  Cardiovascular: Normal rate, regular rhythm and normal heart sounds.  Exam reveals no gallop and no friction rub.   No murmur heard. Respiratory: Effort normal and breath sounds normal. No respiratory distress. She has no wheezes. She has no rales. She exhibits no tenderness.  GI: Soft. She exhibits no distension and no mass. There is no tenderness. There is no rebound and no guarding.  Musculoskeletal: Normal range of motion.  Neurological: She is alert and oriented to person, place, and time.  Skin: Skin is warm and dry.  Psychiatric: She has a normal mood and affect.    MAU Course  Procedures  MDM Will insert IV IV bolus given Antiemetic given GI cocktail given to see if it relieves chest pressure. 2158: Patient states that her upper GI pain is now 0/10. She is tolerating PO. She has had 1L D5LR  with phenergan, pepcid and GI cocktail.    Assessment and Plan  A:   1. Nausea/vomiting in pregnancy          SIUP at 1627w2d       Nausea and vomiting.  Unclear whether it is related to pregnancy vs. Gastroenteritis  P; Plan as above      Report given to oncoming CNM      FU with [facility] as planned      Return to MAU as needed RX: phenergan #30    [facility] D[facility] F[facility] Surgery CenterWILLIAMS,MARIE 07/17/2014, 8:18 PM

## 2014-07-17 NOTE — MAU Note (Signed)
Been throwing up for 3 days straight, can't eat or drink.  Dizzy and chest is tight. Feels like she can't breath, but knows she is

## 2014-07-19 ENCOUNTER — Encounter (HOSPITAL_COMMUNITY): Payer: Self-pay | Admitting: *Deleted

## 2014-07-19 ENCOUNTER — Inpatient Hospital Stay (HOSPITAL_COMMUNITY)
Admission: AD | Admit: 2014-07-19 | Discharge: 2014-07-19 | Disposition: A | Discharge status: Home / Self Care | Payer: 59 | Source: Ambulatory Visit | Attending: Obstetrics & Gynecology | Admitting: Obstetrics & Gynecology

## 2014-07-19 DIAGNOSIS — Z87891 Personal history of nicotine dependence: Secondary | ICD-10-CM | POA: Insufficient documentation

## 2014-07-19 DIAGNOSIS — E876 Hypokalemia: Secondary | ICD-10-CM

## 2014-07-19 DIAGNOSIS — K210 Gastro-esophageal reflux disease with esophagitis: Secondary | ICD-10-CM | POA: Diagnosis not present

## 2014-07-19 DIAGNOSIS — Z3A01 Less than 8 weeks gestation of pregnancy: Secondary | ICD-10-CM | POA: Diagnosis not present

## 2014-07-19 DIAGNOSIS — O210 Mild hyperemesis gravidarum: Secondary | ICD-10-CM | POA: Diagnosis present

## 2014-07-19 DIAGNOSIS — O99611 Diseases of the digestive system complicating pregnancy, first trimester: Secondary | ICD-10-CM | POA: Insufficient documentation

## 2014-07-19 DIAGNOSIS — O219 Vomiting of pregnancy, unspecified: Secondary | ICD-10-CM

## 2014-07-19 DIAGNOSIS — K219 Gastro-esophageal reflux disease without esophagitis: Secondary | ICD-10-CM

## 2014-07-19 LAB — COMPREHENSIVE METABOLIC PANEL
ALBUMIN: 4.3 g/dL (ref 3.5–5.0)
ALT: 35 U/L (ref 14–54)
AST: 25 U/L (ref 15–41)
Alkaline Phosphatase: 32 U/L — ABNORMAL LOW (ref 38–126)
Anion gap: 12 (ref 5–15)
BILIRUBIN TOTAL: 1 mg/dL (ref 0.3–1.2)
BUN: 9 mg/dL (ref 6–20)
CALCIUM: 9.1 mg/dL (ref 8.9–10.3)
CO2: 23 mmol/L (ref 22–32)
CREATININE: 0.61 mg/dL (ref 0.44–1.00)
Chloride: 99 mmol/L — ABNORMAL LOW (ref 101–111)
GFR calc Af Amer: >60 mL/min (ref >60)
GFR calc non Af Amer: >60 mL/min (ref >60)
GLUCOSE: 92 mg/dL (ref 65–99)
Potassium: 3 mmol/L — ABNORMAL LOW (ref 3.5–5.1)
Sodium: 134 mmol/L — ABNORMAL LOW (ref 135–145)
TOTAL PROTEIN: 7.8 g/dL (ref 6.5–8.1)

## 2014-07-19 LAB — CBC WITH DIFFERENTIAL/PLATELET
BASOS PCT: 0 % (ref 0–1)
Basophils Absolute: 0 10*3/uL (ref 0.0–0.1)
EOS ABS: 0.1 10*3/uL (ref 0.0–0.7)
Eosinophils Relative: 1 % (ref 0–5)
HCT: 38.9 % (ref 36.0–46.0)
Hemoglobin: 14 g/dL (ref 12.0–15.0)
Lymphocytes Relative: 26 % (ref 12–46)
Lymphs Abs: 2 10*3/uL (ref 0.7–4.0)
MCH: 34.2 pg — ABNORMAL HIGH (ref 26.0–34.0)
MCHC: 36 g/dL (ref 30.0–36.0)
MCV: 95.1 fL (ref 78.0–100.0)
Monocytes Absolute: 0.8 10*3/uL (ref 0.1–1.0)
Monocytes Relative: 10 % (ref 3–12)
Neutro Abs: 5 10*3/uL (ref 1.7–7.7)
Neutrophils Relative %: 63 % (ref 43–77)
PLATELETS: 211 10*3/uL (ref 150–400)
RBC: 4.09 MIL/uL (ref 3.87–5.11)
RDW: 12.6 % (ref 11.5–15.5)
WBC: 7.9 10*3/uL (ref 4.0–10.5)

## 2014-07-19 LAB — URINALYSIS, ROUTINE W REFLEX MICROSCOPIC
GLUCOSE, UA: NEGATIVE mg/dL
Hgb urine dipstick: NEGATIVE
Ketones, ur: >80 mg/dL — AB
Leukocytes, UA: NEGATIVE
Nitrite: NEGATIVE
PH: 7 (ref 5.0–8.0)
PROTEIN: 30 mg/dL — AB
SPECIFIC GRAVITY, URINE: 1.015 (ref 1.005–1.030)
UROBILINOGEN UA: 2 mg/dL — AB (ref 0.0–1.0)

## 2014-07-19 LAB — URINE MICROSCOPIC-ADD ON

## 2014-07-19 MED ORDER — METOCLOPRAMIDE HCL 10 MG PO TABS: 10.0000 mg | ORAL_TABLET | Freq: Three times a day (TID) | ORAL | Status: DC

## 2014-07-19 MED ORDER — FAMOTIDINE IN NACL 20-0.9 MG/50ML-% IV SOLN
20.0000 mg | Freq: Once | INTRAVENOUS | Status: AC
  Administered 2014-07-19: 20 mg via INTRAVENOUS
  Filled 2014-07-19: qty 50

## 2014-07-19 MED ORDER — RANITIDINE HCL 150 MG PO TABS: 150.0000 mg | ORAL_TABLET | Freq: Two times a day (BID) | ORAL | Status: DC

## 2014-07-19 MED ORDER — PROMETHAZINE HCL 25 MG/ML IJ SOLN
25.0000 mg | Freq: Once | INTRAVENOUS | Status: AC
  Administered 2014-07-19: 25 mg via INTRAVENOUS
  Filled 2014-07-19: qty 1

## 2014-07-19 MED ORDER — GI COCKTAIL ~~LOC~~
30.0000 mL | Freq: Once | ORAL | Status: AC
  Administered 2014-07-19: 30 mL via ORAL
  Filled 2014-07-19: qty 30

## 2014-07-19 MED ORDER — LACTATED RINGERS IV BOLUS (SEPSIS)
1000.0000 mL | Freq: Once | INTRAVENOUS | Status: AC
  Administered 2014-07-19: 1000 mL via INTRAVENOUS

## 2014-07-19 MED ORDER — POTASSIUM CHLORIDE CRYS ER 20 MEQ PO TBCR
20.0000 meq | EXTENDED_RELEASE_TABLET | Freq: Once | ORAL | Status: AC
  Administered 2014-07-19: 20 meq via ORAL
  Filled 2014-07-19: qty 1

## 2014-07-19 NOTE — MAU Provider Note (Signed)
History     CSN: [id number]  Arrival date and time: 07/19/14 [id number]   First Provider Initiated Contact with Patient 07/19/14 1915      Chief Complaint  Patient presents with  . Shortness of Breath   HPI [name] is a 29 y.o. G2P1 at 3143w4d who presents to MAU today with complaint of N/V, chest pain and dizziness. The patient was seen in MAU on 07/17/14 with the same complaints. She states N/V has continued. She states chest pain from acid reflux is worse today. She denies abdominal pain, vaginal bleeding or urinary symptoms. She felt faint while waiting in the lobby, but denies LOC. Patient was given Rx for Phenergan which she states she has been taking. Last dose was this evening.   OB History    Gravida Para Term Preterm AB TAB SAB Ectopic Multiple Living   2 1        1       History reviewed. No pertinent past medical history.  Past Surgical History  Procedure Laterality Date  . Cesarean section    . Appendectomy    . Wisdom tooth extraction      History reviewed. No pertinent family history.  History  Substance Use Topics  . Smoking status: Former Smoker -- 0.50 packs/day  . Smokeless tobacco: Not on file  . Alcohol Use: No    Allergies:  Allergies  Allergen Reactions  . Bactrim [Sulfamethoxazole-Trimethoprim] Hives  . Flagyl [Metronidazole] Hives  . Lubricants Hives    KY jelly causes itching and hives  . Penicillins Other (See Comments)    From record as child.  Has had Amoxicillin as an adult without a reaction.    Prescriptions prior to admission  Medication Sig Dispense Refill Last Dose  . chlorhexidine (PERIDEX) 0.12 % solution Use as directed 15 mLs in the mouth or throat 2 (two) times daily. Rinse with solution twice daily.   Past Week at Unknown time  . Prenatal Vit-Fe Fumarate-FA (PRENATAL MULTIVITAMIN) TABS tablet Take 1 tablet by mouth daily at 12 noon.   Past Week at Unknown time  . promethazine (PHENERGAN) 25 MG tablet Take 0.5-1 tablets  (12.5-25 mg total) by mouth every 6 (six) hours as needed. 30 tablet 0 07/19/2014 at Unknown time    Review of Systems  Constitutional: Negative for fever and malaise/fatigue.  Cardiovascular: Positive for chest pain.  Gastrointestinal: Positive for nausea and vomiting. Negative for abdominal pain, diarrhea and constipation.  Genitourinary: Negative for dysuria, urgency and frequency.       Neg - vaginal bleeding   Physical Exam   Blood pressure 126/89, pulse 77, temperature 97.9 F (36.6 C), temperature source Oral, resp. rate 22, last menstrual period 06/03/2014, SpO2 100 %.  Physical Exam  Nursing note and vitals reviewed. Constitutional: She is oriented to person, place, and time. She appears well-developed and well-nourished. No distress.  HENT:  Head: Normocephalic and atraumatic.  Cardiovascular: Normal rate.   Respiratory: Effort normal.  GI: Soft. She exhibits no distension and no mass. There is no tenderness. There is no rebound and no guarding.  Neurological: She is alert and oriented to person, place, and time.  Skin: Skin is warm and dry. No erythema.  Psychiatric: She has a normal mood and affect.   Results for orders placed or performed during the hospital encounter of 07/19/14 (from the past 24 hour(s))  Urinalysis, Routine w reflex microscopic     Status: Abnormal   Collection Time: 07/19/14  7:20  PM  Result Value Ref Range   Color, Urine YELLOW YELLOW   APPearance CLEAR CLEAR   Specific Gravity, Urine 1.015 1.005 - 1.030   pH 7.0 5.0 - 8.0   Glucose, UA NEGATIVE NEGATIVE mg/dL   Hgb urine dipstick NEGATIVE NEGATIVE   Bilirubin Urine SMALL (A) NEGATIVE   Ketones, ur >80 (A) NEGATIVE mg/dL   Protein, ur 30 (A) NEGATIVE mg/dL   Urobilinogen, UA 2.0 (H) 0.0 - 1.0 mg/dL   Nitrite NEGATIVE NEGATIVE   Leukocytes, UA NEGATIVE NEGATIVE  Urine microscopic-add on     Status: Abnormal   Collection Time: 07/19/14  7:20 PM  Result Value Ref Range   Squamous Epithelial  / LPF FEW (A) RARE   WBC, UA 0-2 <3 WBC/hpf   RBC / HPF 0-2 <3 RBC/hpf   Bacteria, UA FEW (A) RARE   Urine-Other MUCOUS PRESENT   CBC with Differential/Platelet     Status: Abnormal   Collection Time: 07/19/14  7:35 PM  Result Value Ref Range   WBC 7.9 4.0 - 10.5 K/uL   RBC 4.09 3.87 - 5.11 MIL/uL   Hemoglobin 14.0 12.0 - 15.0 g/dL   HCT 16.1 09.6 - 04.5 %   MCV 95.1 78.0 - 100.0 fL   MCH 34.2 (H) 26.0 - 34.0 pg   MCHC 36.0 30.0 - 36.0 g/dL   RDW 40.9 81.1 - 91.4 %   Platelets 211 150 - 400 K/uL   Neutrophils Relative % 63 43 - 77 %   Neutro Abs 5.0 1.7 - 7.7 K/uL   Lymphocytes Relative 26 12 - 46 %   Lymphs Abs 2.0 0.7 - 4.0 K/uL   Monocytes Relative 10 3 - 12 %   Monocytes Absolute 0.8 0.1 - 1.0 K/uL   Eosinophils Relative 1 0 - 5 %   Eosinophils Absolute 0.1 0.0 - 0.7 K/uL   Basophils Relative 0 0 - 1 %   Basophils Absolute 0.0 0.0 - 0.1 K/uL  Comprehensive metabolic panel     Status: Abnormal   Collection Time: 07/19/14  7:35 PM  Result Value Ref Range   Sodium 134 (L) 135 - 145 mmol/L   Potassium 3.0 (L) 3.5 - 5.1 mmol/L   Chloride 99 (L) 101 - 111 mmol/L   CO2 23 22 - 32 mmol/L   Glucose, Bld 92 65 - 99 mg/dL   BUN 9 6 - 20 mg/dL   Creatinine, Ser 7.82 0.44 - 1.00 mg/dL   Calcium 9.1 8.9 - 95.6 mg/dL   Total Protein 7.8 6.5 - 8.1 g/dL   Albumin 4.3 3.5 - 5.0 g/dL   AST 25 15 - 41 U/L   ALT 35 14 - 54 U/L   Alkaline Phosphatase 32 (L) 38 - 126 U/L   Total Bilirubin 1.0 0.3 - 1.2 mg/dL   GFR calc non Af Amer >60 >60 mL/min   GFR calc Af Amer >60 >60 mL/min   Anion gap 12 5 - 15    Orthostatic VS for the past 24 hrs:  BP- Lying Pulse- Lying BP- Sitting Pulse- Sitting  07/19/14 1920 - - 113/63 mmHg 74  07/19/14 1917 109/78 mmHg 67 - -    MAU Course  Procedures None  MDM UA, CBC, CMP, EKG and orthostatic vital signs ordered IV fluids and Phenergan given  Will try GI cocktail again when N/V has subsided  EKG - normal sinus rhythm. 1930 - Labs pending.  Patient has orders for IV fluids and antiemetics. Care turned  over to [employer] FinancialLori Clemmons, CNM  Marny LowensteinJulie N Wenzel, PA-C  07/19/2014, 8:12 PM    KDUR 20 meq given in MAU PO Pepcid 20 IVPB LR Bolus   Patient tolerating PO fluids and crackers prior to discharge.  Assessment and Plan   A:  1. Nausea and vomiting during pregnancy   2. Gastroesophageal reflux disease, esophagitis presence not specified   3. Hypokalemia     P:  Discharge home in stable condition RX: Reglan, Zantac. Patient encouraged to continue phenergan at night Over the counter potassium supplement recommended Small, frequent meals.  Duane LopeJennifer I Tatym Schermer, NP 07/20/2014 12:46 AM

## 2014-07-19 NOTE — MAU Note (Signed)
Pt feeling SOB & tight in her chest all day, has gotten progressively worse, denies hx of asthma.  Pt became very dizzy & hot in the lobby, felt like she was going to pass out.  Has N&V, denies bleeding.

## 2014-07-19 NOTE — MAU Note (Signed)
Pt has history of dizzyness, chest pain, and fainting with previous pregnancy. Tilt table test done, no findings.

## 2014-07-26 ENCOUNTER — Inpatient Hospital Stay (HOSPITAL_COMMUNITY)
Admission: AD | Admit: 2014-07-26 | Discharge: 2014-07-26 | Disposition: A | Discharge status: Home / Self Care | Payer: 59 | Source: Ambulatory Visit | Attending: Family Medicine | Admitting: Family Medicine

## 2014-07-26 ENCOUNTER — Encounter (HOSPITAL_COMMUNITY): Payer: Self-pay | Admitting: Obstetrics and Gynecology

## 2014-07-26 DIAGNOSIS — Z3A01 Less than 8 weeks gestation of pregnancy: Secondary | ICD-10-CM | POA: Insufficient documentation

## 2014-07-26 DIAGNOSIS — O210 Mild hyperemesis gravidarum: Secondary | ICD-10-CM | POA: Diagnosis present

## 2014-07-26 DIAGNOSIS — O219 Vomiting of pregnancy, unspecified: Secondary | ICD-10-CM | POA: Diagnosis not present

## 2014-07-26 DIAGNOSIS — Z87891 Personal history of nicotine dependence: Secondary | ICD-10-CM | POA: Diagnosis not present

## 2014-07-26 DIAGNOSIS — O211 Hyperemesis gravidarum with metabolic disturbance: Secondary | ICD-10-CM | POA: Diagnosis not present

## 2014-07-26 DIAGNOSIS — E860 Dehydration: Secondary | ICD-10-CM

## 2014-07-26 LAB — URINALYSIS, ROUTINE W REFLEX MICROSCOPIC
Bilirubin Urine: NEGATIVE
GLUCOSE, UA: NEGATIVE mg/dL
Hgb urine dipstick: NEGATIVE
Ketones, ur: >80 mg/dL — AB
LEUKOCYTES UA: NEGATIVE
NITRITE: NEGATIVE
PH: 7.5 (ref 5.0–8.0)
PROTEIN: 30 mg/dL — AB
Specific Gravity, Urine: 1.02 (ref 1.005–1.030)
Urobilinogen, UA: 0.2 mg/dL (ref 0.0–1.0)

## 2014-07-26 LAB — BASIC METABOLIC PANEL
Anion gap: 9 (ref 5–15)
BUN: 9 mg/dL (ref 6–20)
CHLORIDE: 101 mmol/L (ref 101–111)
CO2: 23 mmol/L (ref 22–32)
Calcium: 8.9 mg/dL (ref 8.9–10.3)
Creatinine, Ser: 0.54 mg/dL (ref 0.44–1.00)
GFR calc Af Amer: >60 mL/min (ref >60)
Glucose, Bld: 84 mg/dL (ref 65–99)
Potassium: 3.8 mmol/L (ref 3.5–5.1)
Sodium: 133 mmol/L — ABNORMAL LOW (ref 135–145)

## 2014-07-26 LAB — URINE MICROSCOPIC-ADD ON

## 2014-07-26 MED ORDER — DOXYLAMINE-PYRIDOXINE 10-10 MG PO TBEC: 1.0000 | DELAYED_RELEASE_TABLET | Freq: Two times a day (BID) | ORAL | Status: DC

## 2014-07-26 MED ORDER — DEXTROSE 5 % IV SOLN: INTRAVENOUS | Status: DC

## 2014-07-26 MED ORDER — PROMETHAZINE HCL 25 MG/ML IJ SOLN
25.0000 mg | Freq: Once | INTRAVENOUS | Status: AC
  Administered 2014-07-26: 25 mg via INTRAVENOUS
  Filled 2014-07-26: qty 1

## 2014-07-26 MED ORDER — DEXTROSE 5 % IN LACTATED RINGERS IV BOLUS
1000.0000 mL | Freq: Once | INTRAVENOUS | Status: AC
  Administered 2014-07-26: 1000 mL via INTRAVENOUS

## 2014-07-26 NOTE — MAU Note (Signed)
Can't keep nothing down.  Feel so dizzy and am so hungry.  Taking the medicine, but it is not working.

## 2014-07-26 NOTE — Discharge Instructions (Signed)
Eating Plan for Hyperemesis Gravidarum Severe cases of hyperemesis gravidarum can lead to dehydration and malnutrition. The hyperemesis eating plan is one way to lessen the symptoms of nausea and vomiting. It is often used with prescribed medicines to control your symptoms.  WHAT CAN I DO TO RELIEVE MY SYMPTOMS? Listen to your body. Everyone is different and has different preferences. Find what works best for you. Some of the following things may help:  Eat and drink slowly.  Eat 5-6 small meals daily instead of 3 large meals.   Eat crackers before you get out of bed in the morning.   Starchy foods are usually well tolerated (such as cereal, toast, bread, potatoes, pasta, rice, and pretzels).   Ginger may help with nausea. Add  tsp ground ginger to hot tea or choose ginger tea.   Try drinking 100% fruit juice or an electrolyte drink.  Continue to take your prenatal vitamins as directed by your health care provider. If you are having trouble taking your prenatal vitamins, talk with your health care provider about different options.  Include at least 1 serving of protein with your meals and snacks (such as meats or poultry, beans, nuts, eggs, or yogurt). Try eating a protein-rich snack before bed (such as cheese and crackers or a half [place] or peanut butter sandwich). WHAT THINGS SHOULD I AVOID TO REDUCE MY SYMPTOMS? The following things may help reduce your symptoms:  Avoid foods with strong smells. Try eating meals in well-ventilated areas that are free of odors.  Avoid drinking water or other beverages with meals. Try not to drink anything less than 30 minutes before and after meals.  Avoid drinking more than 1 cup of fluid at a time.  Avoid fried or high-fat foods, such as butter and cream sauces.  Avoid spicy foods.  Avoid skipping meals the best you can. Nausea can be more intense on an empty stomach. If you cannot tolerate food at that time, do not force it. Try sucking on  ice chips or other frozen items and make up the calories later.  Avoid lying down within 2 hours after eating. Document Released: 12/20/2006 Document Revised: 02/27/2013 Document Reviewed: 12/27/2012 [facility] Patient Information 2015 [place], [place]. This information is not intended to replace advice given to you by your health care provider. Make sure you discuss any questions you have with your health care provider. Morning Sickness Morning sickness is when you feel sick to your stomach (nauseous) during pregnancy. This nauseous feeling may or may not come with vomiting. It often occurs in the morning but can be a problem any time of day. Morning sickness is most common during the first trimester, but it may continue throughout pregnancy. While morning sickness is unpleasant, it is usually harmless unless you develop severe and continual vomiting (hyperemesis gravidarum). This condition requires more intense treatment.  CAUSES  The cause of morning sickness is not completely known but seems to be related to normal hormonal changes that occur in pregnancy. RISK FACTORS You are at greater risk if you:  Experienced nausea or vomiting before your pregnancy.  Had morning sickness during a previous pregnancy.  Are pregnant with more than one baby, such as twins. TREATMENT  Do not use any medicines (prescription, over-the-counter, or herbal) for morning sickness without first talking to your health care provider. Your health care provider may prescribe or recommend:  Vitamin B6 supplements.  Anti-nausea medicines.  The herbal medicine ginger. HOME CARE INSTRUCTIONS   Only take over-the-counter or  prescription medicines as directed by your health care provider.  Taking multivitamins before getting pregnant can prevent or decrease the severity of morning sickness in most women.  Eat a piece of dry toast or unsalted crackers before getting out of bed in the morning.  Eat five or six small  meals a day.  Eat dry and bland foods (rice, baked potato). Foods high in carbohydrates are often helpful.  Do not drink liquids with your meals. Drink liquids between meals.  Avoid greasy, fatty, and spicy foods.  Get someone to cook for you if the smell of any food causes nausea and vomiting.  If you feel nauseous after taking prenatal vitamins, take the vitamins at night or with a snack.  Snack on protein foods (nuts, yogurt, cheese) between meals if you are hungry.  Eat unsweetened gelatins for desserts.  Wearing an acupressure wristband (worn for sea sickness) may be helpful.  Acupuncture may be helpful.  Do not smoke.  Get a humidifier to keep the air in your house free of odors.  Get plenty of fresh air. SEEK MEDICAL CARE IF:   Your home remedies are not working, and you need medicine.  You feel dizzy or lightheaded.  You are losing weight. SEEK IMMEDIATE MEDICAL CARE IF:   You have persistent and uncontrolled nausea and vomiting.  You pass out (faint). MAKE SURE YOU:  Understand these instructions.  Will watch your condition.  Will get help right away if you are not doing well or get worse. Document Released: 04/15/2006 Document Revised: 02/27/2013 Document Reviewed: 08/09/2012 [facility] Patient Information 2015 [place], [place]. This information is not intended to replace advice given to you by your health care provider. Make sure you discuss any questions you have with your health care provider. Hyperemesis Gravidarum Hyperemesis gravidarum is a severe form of nausea and vomiting that happens during pregnancy. Hyperemesis is worse than morning sickness. It may cause you to have nausea or vomiting all day for many days. It may keep you from eating and drinking enough food and liquids. Hyperemesis usually occurs during the first half (the first 20 weeks) of pregnancy. It often goes away once a woman is in her second half of pregnancy. However, sometimes  hyperemesis continues through an entire pregnancy.  CAUSES  The cause of this condition is not completely known but is thought to be related to changes in the body's hormones when pregnant. It could be from the high level of the pregnancy hormone or an increase in estrogen in the body.  SIGNS AND SYMPTOMS   Severe nausea and vomiting.  Nausea that does not go away.  Vomiting that does not allow you to keep any food down.  Weight loss and body fluid loss (dehydration).  Having no desire to eat or not liking food you have previously enjoyed. DIAGNOSIS  Your health care provider will do a physical exam and ask you about your symptoms. He or she may also order blood tests and urine tests to make sure something else is not causing the problem.  TREATMENT  You may only need medicine to control the problem. If medicines do not control the nausea and vomiting, you will be treated in the hospital to prevent dehydration, increased acid in the blood (acidosis), weight loss, and changes in the electrolytes in your body that may harm the unborn baby (fetus). You may need IV fluids.  HOME CARE INSTRUCTIONS   Only take over-the-counter or prescription medicines as directed by your health care provider.  Try eating a couple of dry crackers or toast in the morning before getting out of bed.  Avoid foods and smells that upset your stomach.  Avoid fatty and spicy foods.  Eat 5-6 small meals a day.  Do not drink when eating meals. Drink between meals.  For snacks, eat high-protein foods, such as cheese.  Eat or suck on things that have ginger in them. Ginger helps nausea.  Avoid food preparation. The smell of food can spoil your appetite.  Avoid iron pills and iron in your multivitamins until after 3-4 months of being pregnant. However, consult with your health care provider before stopping any prescribed iron pills. SEEK MEDICAL CARE IF:   Your abdominal pain increases.  You have a severe  headache.  You have vision problems.  You are losing weight. SEEK IMMEDIATE MEDICAL CARE IF:   You are unable to keep fluids down.  You vomit blood.  You have constant nausea and vomiting.  You have excessive weakness.  You have extreme thirst.  You have dizziness or fainting.  You have a fever or persistent symptoms for more than 2-3 days.  You have a fever and your symptoms suddenly get worse. MAKE SURE YOU:   Understand these instructions.  Will watch your condition.  Will get help right away if you are not doing well or get worse. Document Released: 02/22/2005 Document Revised: 12/13/2012 Document Reviewed: 10/04/2012 [facility] Patient Information 2015 [place], [place]. This information is not intended to replace advice given to you by your health care provider. Make sure you discuss any questions you have with your health care provider.

## 2014-07-26 NOTE — MAU Provider Note (Signed)
Chief Complaint: Hyperemesis Gravidarum   First Provider Initiated Contact with Patient 07/26/14 1614     SUBJECTIVE HPI Comments: Brandy Ferguson is a 29 y.o. G2P1 at 3236w4d by LMP who presents with recurrent episode of nausea and vomiting during pregnancy. She has been vomiting everything she eats for about 1-1/2 days. She tried taking Phenergan and Reglan this morning but vomited the meds. Emesis appears to be food or bile. No hematemesis. The vomiting is associated with mild and brief epigastric cramping pain and with spitting however she denies spitting during periods when she is not vomiting. Denies reflux symptoms. Denies specific food intolerances. She feels weak and at times dizzy. This is her third MAU visit. She believes she weighed 136 prepregnancy but was not weighed at prior visit on 07/19/2014. At that visit she had EKG showing NSR and received IV potassium for potassium level 3.0. She received Phenergan and Pepcid IV.      History reviewed. No pertinent past medical history. OB History  Gravida Para Term Preterm AB SAB TAB Ectopic Multiple Living  2 1        1     # Outcome Date GA Lbr Len/2nd Weight Sex Delivery Anes PTL Lv  2 Current           1 Para              Past Surgical History  Procedure Laterality Date  . Cesarean section    . Appendectomy    . Wisdom tooth extraction     History   Social History  . Marital Status: Single    Spouse Name: N/A  . Number of Children: N/A  . Years of Education: N/A   Occupational History  . Not on file.   Social History Main Topics  . Smoking status: Former Smoker -- 0.50 packs/day  . Smokeless tobacco: Not on file  . Alcohol Use: No  . Drug Use: No  . Sexual Activity: Yes    Birth Control/ Protection: None   Other Topics Concern  . Not on file   Social History Narrative   No current facility-administered medications on file prior to encounter.   Current Outpatient Prescriptions on File Prior to Encounter   Medication Sig Dispense Refill  . metoCLOPramide (REGLAN) 10 MG tablet Take 1 tablet (10 mg total) by mouth 3 (three) times daily before meals. 30 tablet 1  . Prenatal Vit-Fe Fumarate-FA (PRENATAL MULTIVITAMIN) TABS tablet Take 1 tablet by mouth daily at 12 noon.    . promethazine (PHENERGAN) 25 MG tablet Take 0.5-1 tablets (12.5-25 mg total) by mouth every 6 (six) hours as needed. 30 tablet 0  . ranitidine (ZANTAC) 150 MG tablet Take 1 tablet (150 mg total) by mouth 2 (two) times daily. 60 tablet 1   Allergies  Allergen Reactions  . Bactrim [Sulfamethoxazole-Trimethoprim] Hives  . Flagyl [Metronidazole] Hives  . Lubricants Hives    KY jelly causes itching and hives  . Penicillins Other (See Comments)    From record as child.  Has had Amoxicillin as an adult without a reaction.    ROS  OBJECTIVE Blood pressure 115/68, pulse 80, temperature 98.1 F (36.7 C), temperature source Oral, resp. rate 16, height 5' 2.5" (1.588 m), weight 58.514 kg (129 lb), last menstrual period 06/03/2014. GENERAL: Well-developed, well-nourished female in no acute distress.  HEART: normal rate RESP: normal effort GI: Abdomen soft, non-tender.  MS: Nontender, no edema NEURO: Alert and oriented  LAB RESULTS Results for orders placed or  performed during the hospital encounter of 07/26/14 (from the past 24 hour(s))  Urinalysis, Routine w reflex microscopic     Status: Abnormal   Collection Time: 07/26/14  1:09 PM  Result Value Ref Range   Color, Urine YELLOW YELLOW   APPearance CLEAR CLEAR   Specific Gravity, Urine 1.020 1.005 - 1.030   pH 7.5 5.0 - 8.0   Glucose, UA NEGATIVE NEGATIVE mg/dL   Hgb urine dipstick NEGATIVE NEGATIVE   Bilirubin Urine NEGATIVE NEGATIVE   Ketones, ur >80 (A) NEGATIVE mg/dL   Protein, ur 30 (A) NEGATIVE mg/dL   Urobilinogen, UA 0.2 0.0 - 1.0 mg/dL   Nitrite NEGATIVE NEGATIVE   Leukocytes, UA NEGATIVE NEGATIVE  Urine microscopic-add on     Status: Abnormal   Collection  Time: 07/26/14  1:09 PM  Result Value Ref Range   Squamous Epithelial / LPF FEW (A) RARE   WBC, UA 0-2 <3 WBC/hpf   RBC / HPF 3-6 <3 RBC/hpf   Bacteria, UA FEW (A) RARE   Urine-Other MUCOUS PRESENT   Basic metabolic panel     Status: Abnormal   Collection Time: 07/26/14  5:04 PM  Result Value Ref Range   Sodium 133 (L) 135 - 145 mmol/L   Potassium 3.8 3.5 - 5.1 mmol/L   Chloride 101 101 - 111 mmol/L   CO2 23 22 - 32 mmol/L   Glucose, Bld 84 65 - 99 mg/dL   BUN 9 6 - 20 mg/dL   Creatinine, Ser 5.620.54 0.44 - 1.00 mg/dL   Calcium 8.9 8.9 - 13.010.3 mg/dL   GFR calc non Af Amer >60 >60 mL/min   GFR calc Af Amer >60 >60 mL/min   Anion gap 9 5 - 15    IMAGING No results found.  MAU COURSE Orthostatic VS done>no significant changes IV LR with Phenergan 25 mg; IV D5LR > improved and retaining crackers  ASSESSMENT 1. Nausea and vomiting during pregnancy prior to [redacted] weeks gestation   2. Dehydration   G2P1001 at 638w4d  PLAN Discharge home in stable condition.    Medication List    TAKE these medications        Doxylamine-Pyridoxine 10-10 MG Tbec  Commonly known as:  DICLEGIS  Take 1 tablet by mouth 2 (two) times daily.     metoCLOPramide 10 MG tablet  Commonly known as:  REGLAN  Take 1 tablet (10 mg total) by mouth 3 (three) times daily before meals.     multivitamin with minerals Tabs tablet  Take 1 tablet by mouth daily.     promethazine 25 MG tablet  Commonly known as:  PHENERGAN  Take 0.5-1 tablets (12.5-25 mg total) by mouth every 6 (six) hours as needed.     ranitidine 150 MG tablet  Commonly known as:  ZANTAC  Take 1 tablet (150 mg total) by mouth 2 (two) times daily.       Follow-up Information    [facility]   Specialty:  Obstetrics and Gynecology   Why:  Keep your scheduled prenatal appointment   Contact information:   [address] [place] [id number] [phone]       Danae OrleansDeirdre C Caitriona Sundquist, CNM 07/26/2014   4:14 PM

## 2014-08-02 ENCOUNTER — Inpatient Hospital Stay (HOSPITAL_COMMUNITY): Payer: 59

## 2014-08-02 ENCOUNTER — Encounter (HOSPITAL_COMMUNITY): Payer: Self-pay | Admitting: *Deleted

## 2014-08-02 ENCOUNTER — Inpatient Hospital Stay (HOSPITAL_COMMUNITY)
Admission: AD | Admit: 2014-08-02 | Discharge: 2014-08-03 | Disposition: A | Discharge status: Home / Self Care | Payer: 59 | Source: Ambulatory Visit | Attending: Family Medicine | Admitting: Family Medicine

## 2014-08-02 DIAGNOSIS — O219 Vomiting of pregnancy, unspecified: Secondary | ICD-10-CM | POA: Diagnosis present

## 2014-08-02 DIAGNOSIS — O418X10 Other specified disorders of amniotic fluid and membranes, first trimester, not applicable or unspecified: Secondary | ICD-10-CM

## 2014-08-02 DIAGNOSIS — Z880 Allergy status to penicillin: Secondary | ICD-10-CM | POA: Diagnosis not present

## 2014-08-02 DIAGNOSIS — O468X1 Other antepartum hemorrhage, first trimester: Secondary | ICD-10-CM | POA: Diagnosis not present

## 2014-08-02 DIAGNOSIS — O26899 Other specified pregnancy related conditions, unspecified trimester: Secondary | ICD-10-CM

## 2014-08-02 DIAGNOSIS — Z882 Allergy status to sulfonamides status: Secondary | ICD-10-CM | POA: Insufficient documentation

## 2014-08-02 DIAGNOSIS — O9989 Other specified diseases and conditions complicating pregnancy, childbirth and the puerperium: Secondary | ICD-10-CM

## 2014-08-02 DIAGNOSIS — O210 Mild hyperemesis gravidarum: Secondary | ICD-10-CM | POA: Diagnosis not present

## 2014-08-02 DIAGNOSIS — Z3A08 8 weeks gestation of pregnancy: Secondary | ICD-10-CM | POA: Insufficient documentation

## 2014-08-02 DIAGNOSIS — Z87891 Personal history of nicotine dependence: Secondary | ICD-10-CM | POA: Insufficient documentation

## 2014-08-02 DIAGNOSIS — O211 Hyperemesis gravidarum with metabolic disturbance: Secondary | ICD-10-CM

## 2014-08-02 DIAGNOSIS — R109 Unspecified abdominal pain: Secondary | ICD-10-CM

## 2014-08-02 LAB — URINALYSIS, ROUTINE W REFLEX MICROSCOPIC
BILIRUBIN URINE: NEGATIVE
Glucose, UA: NEGATIVE mg/dL
Hgb urine dipstick: NEGATIVE
LEUKOCYTES UA: NEGATIVE
Nitrite: NEGATIVE
Protein, ur: 30 mg/dL — AB
UROBILINOGEN UA: 0.2 mg/dL (ref 0.0–1.0)
pH: 6 (ref 5.0–8.0)

## 2014-08-02 LAB — CBC
HCT: 39.7 % (ref 36.0–46.0)
HEMOGLOBIN: 14.1 g/dL (ref 12.0–15.0)
MCH: 33.9 pg (ref 26.0–34.0)
MCHC: 35.5 g/dL (ref 30.0–36.0)
MCV: 95.4 fL (ref 78.0–100.0)
Platelets: 199 10*3/uL (ref 150–400)
RBC: 4.16 MIL/uL (ref 3.87–5.11)
RDW: 13 % (ref 11.5–15.5)
WBC: 5.8 10*3/uL (ref 4.0–10.5)

## 2014-08-02 LAB — COMPREHENSIVE METABOLIC PANEL
ALBUMIN: 4.3 g/dL (ref 3.5–5.0)
ALT: 94 U/L — AB (ref 14–54)
AST: 61 U/L — ABNORMAL HIGH (ref 15–41)
Alkaline Phosphatase: 42 U/L (ref 38–126)
Anion gap: 8 (ref 5–15)
BILIRUBIN TOTAL: 0.6 mg/dL (ref 0.3–1.2)
BUN: 9 mg/dL (ref 6–20)
CHLORIDE: 104 mmol/L (ref 101–111)
CO2: 22 mmol/L (ref 22–32)
CREATININE: 0.54 mg/dL (ref 0.44–1.00)
Calcium: 9.4 mg/dL (ref 8.9–10.3)
GFR calc Af Amer: >60 mL/min (ref >60)
GFR calc non Af Amer: >60 mL/min (ref >60)
GLUCOSE: 79 mg/dL (ref 65–99)
POTASSIUM: 3.6 mmol/L (ref 3.5–5.1)
SODIUM: 134 mmol/L — AB (ref 135–145)
Total Protein: 7.9 g/dL (ref 6.5–8.1)

## 2014-08-02 LAB — LIPASE, BLOOD: Lipase: 16 U/L — ABNORMAL LOW (ref 22–51)

## 2014-08-02 LAB — URINE MICROSCOPIC-ADD ON

## 2014-08-02 LAB — HCG, QUANTITATIVE, PREGNANCY: hCG, Beta Chain, Quant, S: 143584 m[IU]/mL — ABNORMAL HIGH (ref <5)

## 2014-08-02 LAB — AMYLASE: Amylase: 74 U/L (ref 28–100)

## 2014-08-02 MED ORDER — SODIUM CHLORIDE 0.9 % IV SOLN
25.0000 mg | Freq: Once | INTRAVENOUS | Status: AC
  Administered 2014-08-02: 25 mg via INTRAVENOUS
  Filled 2014-08-02: qty 1

## 2014-08-02 MED ORDER — PROMETHAZINE HCL 25 MG/ML IJ SOLN
12.5000 mg | Freq: Once | INTRAMUSCULAR | Status: AC
Start: 2014-08-02 — End: 2014-08-02
  Administered 2014-08-02: 12.5 mg via INTRAVENOUS
  Filled 2014-08-02: qty 1

## 2014-08-02 MED ORDER — METOCLOPRAMIDE HCL 5 MG/ML IJ SOLN
10.0000 mg | Freq: Once | INTRAMUSCULAR | Status: AC
  Administered 2014-08-02: 10 mg via INTRAVENOUS
  Filled 2014-08-02: qty 2

## 2014-08-02 MED ORDER — LACTATED RINGERS IV BOLUS (SEPSIS)
1000.0000 mL | Freq: Once | INTRAVENOUS | Status: AC
  Administered 2014-08-02: 1000 mL via INTRAVENOUS

## 2014-08-02 MED ORDER — PROMETHAZINE HCL 25 MG RE SUPP: 25.0000 mg | Freq: Four times a day (QID) | RECTAL | Status: DC | PRN

## 2014-08-02 MED ORDER — ONDANSETRON 8 MG PO TBDP
8.0000 mg | ORAL_TABLET | Freq: Once | ORAL | Status: AC
  Administered 2014-08-02: 8 mg via ORAL
  Filled 2014-08-02: qty 1

## 2014-08-02 NOTE — Discharge Instructions (Signed)
Use the phenergan suppositories only if you are unable to keep nausea pills down  Nausea & Vomiting  Have saltine crackers or pretzels by your bed and eat a few bites before you raise your head out of bed in the morning  Eat small frequent meals throughout the day instead of large meals  Drink plenty of fluids throughout the day to stay hydrated, just don't drink a lot of fluids with your meals.  This can make your stomach fill up faster making you feel sick  Do not brush your teeth right after you eat  Products with real ginger are good for nausea, like ginger ale and ginger hard candy Make sure it says made with real ginger!  Sucking on sour candy like lemon heads is also good for nausea  If your prenatal vitamins make you nauseated, take them at night so you will sleep through the nausea  Sea Bands  If you feel like you need medicine for the nausea & vomiting please let us know  If you are unable to keep any fluids or food down please let us know    Hyperemesis Gravidarum Hyperemesis gravidarum is a severe form of nausea and vomiting that happens during pregnancy. Hyperemesis is worse than morning sickness. It may cause you to have nausea or vomiting all day for many days. It may keep you from eating and drinking enough food and liquids. Hyperemesis usually occurs during the first half (the first 20 weeks) of pregnancy. It often goes away once a woman is in her second half of pregnancy. However, sometimes hyperemesis continues through an entire pregnancy.  CAUSES  The cause of this condition is not completely known but is thought to be related to changes in the body's hormones when pregnant. It could be from the high level of the pregnancy hormone or an increase in estrogen in the body.  SIGNS AND SYMPTOMS  11. Severe nausea and vomiting. 12. Nausea that does not go away. 13. Vomiting that does not allow you to keep any food down. 14. Weight loss and body fluid loss  (dehydration). 15. Having no desire to eat or not liking food you have previously enjoyed. DIAGNOSIS  Your health care provider will do a physical exam and ask you about your symptoms. He or she may also order blood tests and urine tests to make sure something else is not causing the problem.  TREATMENT  You may only need medicine to control the problem. If medicines do not control the nausea and vomiting, you will be treated in the hospital to prevent dehydration, increased acid in the blood (acidosis), weight loss, and changes in the electrolytes in your body that may harm the unborn baby (fetus). You may need IV fluids.  HOME CARE INSTRUCTIONS   Only take over-the-counter or prescription medicines as directed by your health care provider.  Try eating a couple of dry crackers or toast in the morning before getting out of bed.  Avoid foods and smells that upset your stomach.  Avoid fatty and spicy foods.  Eat 5-6 small meals a day.  Do not drink when eating meals. Drink between meals.  For snacks, eat high-protein foods, such as cheese.  Eat or suck on things that have ginger in them. Ginger helps nausea.  Avoid food preparation. The smell of food can spoil your appetite.  Avoid iron pills and iron in your multivitamins until after 3-4 months of being pregnant. However, consult with your health care provider before stopping  any prescribed iron pills. SEEK MEDICAL CARE IF:   Your abdominal pain increases.  You have a severe headache.  You have vision problems.  You are losing weight. SEEK IMMEDIATE MEDICAL CARE IF:   You are unable to keep fluids down.  You vomit blood.  You have constant nausea and vomiting.  You have excessive weakness.  You have extreme thirst.  You have dizziness or fainting.  You have a fever or persistent symptoms for more than 2-3 days.  You have a fever and your symptoms suddenly get worse. MAKE SURE YOU:   Understand these  instructions.  Will watch your condition.  Will get help right away if you are not doing well or get worse. Document Released: 02/22/2005 Document Revised: 12/13/2012 Document Reviewed: 10/04/2012 [facility] Patient Information 2015 [place], [place]. This information is not intended to replace advice given to you by your health care provider. Make sure you discuss any questions you have with your health care provider.

## 2014-08-02 NOTE — MAU Note (Signed)
Vomiting entire pregnancy, was doing better, has vomited everything for the last 3 days.  Denies pain or bleeding.  Meds aren't working, pt has been using phenergan vaginally with no results.

## 2014-08-02 NOTE — MAU Provider Note (Signed)
History     CSN: [id number]  Arrival date and time: 08/02/14 1616   First Provider Initiated Contact with Patient 08/02/14 1754      Chief Complaint  Patient presents with  . Emesis During Pregnancy   HPI  [name] is a 29 y.o. G2P1002 at [redacted]w[redacted]d here with report of continued nausea and vomiting of pregnancy.  Vomiting worsened over past three days.  Unable to hold down any food or drink.  Denies fever, body aches, or chills.  Reports taking phenergan, reglan and emetrol with little relief.  +appt at [facility] in [place] on Wednesday.    History reviewed. No pertinent past medical history.  Past Surgical History  Procedure Laterality Date  . Cesarean section    . Appendectomy    . Wisdom tooth extraction      Family History  Problem Relation Age of Onset  . Alcohol abuse Neg Hx   . Arthritis Neg Hx   . Asthma Neg Hx   . Birth defects Neg Hx   . Cancer Neg Hx   . COPD Neg Hx   . Depression Neg Hx   . Diabetes Neg Hx   . Drug abuse Neg Hx   . Early death Neg Hx   . Hearing loss Neg Hx   . Heart disease Neg Hx   . Hyperlipidemia Neg Hx   . Hypertension Neg Hx   . Kidney disease Neg Hx   . Learning disabilities Neg Hx   . Mental illness Neg Hx   . Mental retardation Neg Hx   . Miscarriages / Stillbirths Neg Hx   . Stroke Neg Hx   . Vision loss Neg Hx   . Varicose Veins Neg Hx     History  Substance Use Topics  . Smoking status: Former Smoker -- 0.50 packs/day  . Smokeless tobacco: Not on file  . Alcohol Use: No    Allergies:  Allergies  Allergen Reactions  . Bactrim [Sulfamethoxazole-Trimethoprim] Hives  . Flagyl [Metronidazole] Hives  . Lubricants Hives    KY jelly causes itching and hives  . Penicillins Other (See Comments)    From record as child.  Has had Amoxicillin as an adult without a reaction.    Prescriptions prior to admission  Medication Sig Dispense Refill Last Dose  . anti-nausea (EMETROL) solution Take 10 mLs by mouth every 15  (fifteen) minutes as needed for nausea or vomiting.   08/02/2014 at Unknown time  . metoCLOPramide (REGLAN) 10 MG tablet Take 1 tablet (10 mg total) by mouth 3 (three) times daily before meals. 30 tablet 1 08/02/2014 at Unknown time  . Multiple Vitamin (MULTIVITAMIN WITH MINERALS) TABS tablet Take 1 tablet by mouth daily.   Past Week at Unknown time  . promethazine (PHENERGAN) 25 MG tablet Take 0.5-1 tablets (12.5-25 mg total) by mouth every 6 (six) hours as needed. (Patient taking differently: Take 12.5-25 mg by mouth every 6 (six) hours as needed for nausea or vomiting. ) 30 tablet 0 08/02/2014 at Unknown time  . ranitidine (ZANTAC) 150 MG tablet Take 1 tablet (150 mg total) by mouth 2 (two) times daily. 60 tablet 1 Past Week at Unknown time  . Doxylamine-Pyridoxine (DICLEGIS) 10-10 MG TBEC Take 1 tablet by mouth 2 (two) times daily. (Patient not taking: Reported on 08/02/2014) 60 tablet 0 Not Taking at Unknown time    Review of Systems  Constitutional: Negative for fever and chills.  Gastrointestinal: Positive for nausea, vomiting and abdominal pain (  cramping). Negative for diarrhea and constipation.  Genitourinary: Negative for dysuria, urgency, frequency and hematuria.  Neurological: Positive for dizziness.  All other systems reviewed and are negative.  Physical Exam   Blood pressure 113/81, pulse 77, temperature 98.4 F (36.9 C), temperature source Oral, resp. rate 18, height 5\' 2"  (1.575 m), weight 57.879 kg (127 lb 9.6 oz), last menstrual period 06/03/2014.  Physical Exam  Constitutional: She is oriented to person, place, and time. She appears well-developed and well-nourished. No distress.  HENT:  Head: Normocephalic.  Mouth/Throat: Mucous membranes are dry.  Neck: Normal range of motion. Neck supple.  Cardiovascular: Normal rate, regular rhythm and normal heart sounds.   Respiratory: Effort normal and breath sounds normal.  GI: Soft. There is no tenderness.  Genitourinary: No  bleeding in the vagina.  Musculoskeletal: Normal range of motion. She exhibits no edema.  Neurological: She is alert and oriented to person, place, and time. She has normal reflexes.  Skin: Skin is warm and dry. She is not diaphoretic.    MAU Course  Procedures Results for orders placed or performed during the hospital encounter of 08/02/14 (from the past 24 hour(s))  Urinalysis, Routine w reflex microscopic (not at [facility])     Status: Abnormal   Collection Time: 08/02/14  4:55 PM  Result Value Ref Range   Color, Urine YELLOW YELLOW   APPearance CLEAR CLEAR   Specific Gravity, Urine >1.030 (H) 1.005 - 1.030   pH 6.0 5.0 - 8.0   Glucose, UA NEGATIVE NEGATIVE mg/dL   Hgb urine dipstick NEGATIVE NEGATIVE   Bilirubin Urine NEGATIVE NEGATIVE   Ketones, ur >80 (A) NEGATIVE mg/dL   Protein, ur 30 (A) NEGATIVE mg/dL   Urobilinogen, UA 0.2 0.0 - 1.0 mg/dL   Nitrite NEGATIVE NEGATIVE   Leukocytes, UA NEGATIVE NEGATIVE  Urine microscopic-add on     Status: Abnormal   Collection Time: 08/02/14  4:55 PM  Result Value Ref Range   Squamous Epithelial / LPF FEW (A) RARE   WBC, UA 0-2 <3 WBC/hpf   RBC / HPF 0-2 <3 RBC/hpf   Urine-Other MUCOUS PRESENT   CBC     Status: None   Collection Time: 08/02/14  6:30 PM  Result Value Ref Range   WBC 5.8 4.0 - 10.5 K/uL   RBC 4.16 3.87 - 5.11 MIL/uL   Hemoglobin 14.1 12.0 - 15.0 g/dL   HCT 95.639.7 21.336.0 - 08.646.0 %   MCV 95.4 78.0 - 100.0 fL   MCH 33.9 26.0 - 34.0 pg   MCHC 35.5 30.0 - 36.0 g/dL   RDW 57.813.0 46.911.5 - 62.915.5 %   Platelets 199 150 - 400 K/uL  Comprehensive metabolic panel     Status: Abnormal   Collection Time: 08/02/14  6:30 PM  Result Value Ref Range   Sodium 134 (L) 135 - 145 mmol/L   Potassium 3.6 3.5 - 5.1 mmol/L   Chloride 104 101 - 111 mmol/L   CO2 22 22 - 32 mmol/L   Glucose, Bld 79 65 - 99 mg/dL   BUN 9 6 - 20 mg/dL   Creatinine, Ser 5.280.54 0.44 - 1.00 mg/dL   Calcium 9.4 8.9 - 41.310.3 mg/dL   Total Protein 7.9 6.5 - 8.1 g/dL   Albumin  4.3 3.5 - 5.0 g/dL   AST 61 (H) 15 - 41 U/L   ALT 94 (H) 14 - 54 U/L   Alkaline Phosphatase 42 38 - 126 U/L   Total Bilirubin 0.6 0.3 - 1.2  mg/dL   GFR calc non Af Amer >60 >60 mL/min   GFR calc Af Amer >60 >60 mL/min   Anion gap 8 5 - 15  hCG, quantitative, pregnancy     Status: Abnormal   Collection Time: 08/02/14  6:30 PM  Result Value Ref Range   hCG, Beta Chain, Quant, S 409811 (H) <5 mIU/mL   1730 Report given to K. Booker who assumes care of patient  Marlis Edelson, CNM    1730: Received report from [place], [place] 9147: Receiving 1L NS w/  phenergan 1930: Nausea improving, no further vomiting 2000: TVUS w/ +YS and embryo w/ CRL c/w [redacted]w[redacted]d,  +FCA w/ rate of 152bpm, small SCH, no free fluid 2020: Nausea had improved, but vomiting again after trying to drink fluids-> 1L LR, phenergan 12.5mg  IV, Reglan  IV 2115: Nausea improving, will try liquids again 2230: Holding down liquids, will try crackers 2300: Tolerating crackers, no vomiting since 2020, will d/c home w/ phenergan suppositories 2320: Vomiting right before walking out, zofran  ODT x 1  4th MAU visit for n/v w/in 2wks, but weight stable, no electrolyte imbalance, vss Assessment and Plan  A:   [redacted]w[redacted]d SIUP  G2P1002   Hyperemesis  Slightly elevated LFTs w/ normal Amylase/Lipase  Small SCH on today's u/s w/o VB  P:   D/C home  Rx phenergan suppositories to use when not able to keep po phenergan down  Discussed warning s/s, reasons to return  Gave printed info on tips for n/v  Keep appt w/ [facility] in [place] on Wed as scheduled  Continue pnv, zantac, reglan   Cheral Marker, CNM, [facility] 08/02/2014 11:06 PM     RX of Zofran sent per patient request  Duane Lope, NP

## 2014-08-03 MED ORDER — ONDANSETRON 4 MG PO TBDP: 4.0000 mg | ORAL_TABLET | Freq: Three times a day (TID) | ORAL | Status: DC | PRN

## 2014-08-03 NOTE — Progress Notes (Signed)
Venia CarbonJennifer Rasch NP in to see pt. No vomiting since Zofran given. Written and verbal d/c instructions given earlier by Janeth Rasehristina Robinson RN

## 2014-08-03 NOTE — Progress Notes (Signed)
No vomiting after Zofran given.

## 2014-08-12 ENCOUNTER — Inpatient Hospital Stay (HOSPITAL_COMMUNITY)
Admission: AD | Admit: 2014-08-12 | Discharge: 2014-08-12 | Disposition: A | Discharge status: Home / Self Care | Payer: 59 | Source: Ambulatory Visit | Attending: Obstetrics & Gynecology | Admitting: Obstetrics & Gynecology

## 2014-08-12 ENCOUNTER — Encounter (HOSPITAL_COMMUNITY): Payer: Self-pay

## 2014-08-12 DIAGNOSIS — O99611 Diseases of the digestive system complicating pregnancy, first trimester: Secondary | ICD-10-CM | POA: Insufficient documentation

## 2014-08-12 DIAGNOSIS — Z87891 Personal history of nicotine dependence: Secondary | ICD-10-CM | POA: Insufficient documentation

## 2014-08-12 DIAGNOSIS — O219 Vomiting of pregnancy, unspecified: Secondary | ICD-10-CM | POA: Diagnosis not present

## 2014-08-12 DIAGNOSIS — Z3A10 10 weeks gestation of pregnancy: Secondary | ICD-10-CM | POA: Diagnosis not present

## 2014-08-12 DIAGNOSIS — K219 Gastro-esophageal reflux disease without esophagitis: Secondary | ICD-10-CM | POA: Insufficient documentation

## 2014-08-12 DIAGNOSIS — O210 Mild hyperemesis gravidarum: Secondary | ICD-10-CM | POA: Insufficient documentation

## 2014-08-12 LAB — COMPREHENSIVE METABOLIC PANEL
ALK PHOS: 35 U/L — AB (ref 38–126)
ALT: 28 U/L (ref 14–54)
ANION GAP: 8 (ref 5–15)
AST: 26 U/L (ref 15–41)
Albumin: 4.1 g/dL (ref 3.5–5.0)
BUN: 10 mg/dL (ref 6–20)
CALCIUM: 9.2 mg/dL (ref 8.9–10.3)
CHLORIDE: 103 mmol/L (ref 101–111)
CO2: 22 mmol/L (ref 22–32)
CREATININE: 0.6 mg/dL (ref 0.44–1.00)
GFR calc Af Amer: >60 mL/min (ref >60)
GLUCOSE: 82 mg/dL (ref 65–99)
POTASSIUM: 3.7 mmol/L (ref 3.5–5.1)
SODIUM: 133 mmol/L — AB (ref 135–145)
TOTAL PROTEIN: 8.2 g/dL — AB (ref 6.5–8.1)
Total Bilirubin: 0.5 mg/dL (ref 0.3–1.2)

## 2014-08-12 LAB — URINE MICROSCOPIC-ADD ON

## 2014-08-12 LAB — CBC
HCT: 38.8 % (ref 36.0–46.0)
Hemoglobin: 14 g/dL (ref 12.0–15.0)
MCH: 33.9 pg (ref 26.0–34.0)
MCHC: 36.1 g/dL — ABNORMAL HIGH (ref 30.0–36.0)
MCV: 93.9 fL (ref 78.0–100.0)
Platelets: 185 10*3/uL (ref 150–400)
RBC: 4.13 MIL/uL (ref 3.87–5.11)
RDW: 12.6 % (ref 11.5–15.5)
WBC: 4.8 10*3/uL (ref 4.0–10.5)

## 2014-08-12 LAB — URINALYSIS, ROUTINE W REFLEX MICROSCOPIC
GLUCOSE, UA: NEGATIVE mg/dL
HGB URINE DIPSTICK: NEGATIVE
Ketones, ur: >80 mg/dL — AB
Leukocytes, UA: NEGATIVE
NITRITE: NEGATIVE
Protein, ur: 30 mg/dL — AB
Specific Gravity, Urine: >1.03 — ABNORMAL HIGH (ref 1.005–1.030)
UROBILINOGEN UA: 0.2 mg/dL (ref 0.0–1.0)
pH: 6 (ref 5.0–8.0)

## 2014-08-12 MED ORDER — FAMOTIDINE IN NACL 20-0.9 MG/50ML-% IV SOLN: 20.0000 mg | Freq: Once | INTRAVENOUS | Status: DC

## 2014-08-12 MED ORDER — PROMETHAZINE HCL 25 MG/ML IJ SOLN
25.0000 mg | Freq: Once | INTRAVENOUS | Status: AC
  Administered 2014-08-12: 25 mg via INTRAVENOUS
  Filled 2014-08-12: qty 1

## 2014-08-12 MED ORDER — FAMOTIDINE 20 MG PO TABS
20.0000 mg | ORAL_TABLET | Freq: Once | ORAL | Status: AC
  Administered 2014-08-12: 20 mg via ORAL
  Filled 2014-08-12: qty 1

## 2014-08-12 MED ORDER — M.V.I. ADULT IV INJ
INJECTION | Freq: Once | INTRAVENOUS | Status: AC
  Administered 2014-08-12: 15:00:00 via INTRAVENOUS
  Filled 2014-08-12: qty 10

## 2014-08-12 NOTE — MAU Provider Note (Signed)
History     CSN: [id number]  Arrival date and time: 08/12/14 1143   None     Chief Complaint  Patient presents with  . Hyperemesis Gravidarum   HPI Brandy Ferguson is 29 y.o. G2P1001 [redacted]w[redacted]d weeks presenting with persistent Nausea and vomiting in first trimester pregnancy. + dizzy.  States she could not urinate when she woke up this am. Vomited X 6 today, now dry heaves.  She was seen most recently on 5/27 with same complaint.  She has been here 5 times for same sxs.  She was given Rx for phenergan supp, phenergan po and Zofran that patient states are not effective.  U/S on 5/27 showed viable IUP, SCH.  She denies vaginal bleeding or abnormal discharge today.She had appt at [facility] but they rescheduled her for 6/15.  She will be moving back to [place] in a few weeks and may return to [facility] for [facility].     History reviewed. No pertinent past medical history.  Past Surgical History  Procedure Laterality Date  . Cesarean section    . Appendectomy    . Wisdom tooth extraction      Family History  Problem Relation Age of Onset  . Alcohol abuse Neg Hx   . Arthritis Neg Hx   . Asthma Neg Hx   . Birth defects Neg Hx   . Cancer Neg Hx   . COPD Neg Hx   . Depression Neg Hx   . Diabetes Neg Hx   . Drug abuse Neg Hx   . Early death Neg Hx   . Hearing loss Neg Hx   . Heart disease Neg Hx   . Hyperlipidemia Neg Hx   . Hypertension Neg Hx   . Kidney disease Neg Hx   . Learning disabilities Neg Hx   . Mental illness Neg Hx   . Mental retardation Neg Hx   . Miscarriages / Stillbirths Neg Hx   . Stroke Neg Hx   . Vision loss Neg Hx   . Varicose Veins Neg Hx     History  Substance Use Topics  . Smoking status: Former Smoker -- 0.50 packs/day  . Smokeless tobacco: Never Used  . Alcohol Use: No    Allergies:  Allergies  Allergen Reactions  . Bactrim [Sulfamethoxazole-Trimethoprim] Hives  . Flagyl [Metronidazole] Hives  . Lubricants Hives    KY jelly causes itching and hives   . Penicillins Other (See Comments)    From record as child.  Has had Amoxicillin as an adult without a reaction.    Prescriptions prior to admission  Medication Sig Dispense Refill Last Dose  . anti-nausea (EMETROL) solution Take 10 mLs by mouth every 15 (fifteen) minutes as needed for nausea or vomiting.   08/12/2014 at Unknown time  . metoCLOPramide (REGLAN) 10 MG tablet Take 1 tablet (10 mg total) by mouth 3 (three) times daily before meals. 30 tablet 1 08/11/2014 at Unknown time  . Multiple Vitamin (MULTIVITAMIN WITH MINERALS) TABS tablet Take 1 tablet by mouth daily.   08/12/2014 at Unknown time  . promethazine (PHENERGAN) 25 MG suppository Place 1 suppository (25 mg total) rectally every 6 (six) hours as needed for nausea or vomiting. If unable to keep pills down 12 each 0 08/11/2014 at Unknown time  . promethazine (PHENERGAN) 25 MG tablet Take 0.5-1 tablets (12.5-25 mg total) by mouth every 6 (six) hours as needed. (Patient taking differently: Take 12.5-25 mg by mouth every 6 (six) hours as needed for nausea  or vomiting. ) 30 tablet 0 08/11/2014 at Unknown time  . ranitidine (ZANTAC) 150 MG tablet Take 1 tablet (150 mg total) by mouth 2 (two) times daily. 60 tablet 1 08/11/2014 at Unknown time  . ondansetron (ZOFRAN ODT) 4 MG disintegrating tablet Take 1 tablet (4 mg total) by mouth every 8 (eight) hours as needed for nausea or vomiting. 20 tablet 0     Review of Systems  Constitutional: Negative for fever and chills.  Gastrointestinal: Positive for nausea and vomiting. Negative for abdominal pain.  Genitourinary:       Neg for vaginal bleeding/discharge  Neurological: Positive for dizziness. Negative for headaches.   Physical Exam   Blood pressure 116/88, pulse 88, temperature 98.1 F (36.7 C), temperature source Oral, resp. rate 18, height 5\' 2"  (1.575 m), weight 125 lb (56.7 kg), last menstrual period 06/03/2014.  Physical Exam  Nursing note and vitals reviewed. Constitutional: She  appears well-developed and well-nourished. No distress.  HENT:  Head: Normocephalic.  Neck: Normal range of motion.  Cardiovascular: Normal rate.   Respiratory: Effort normal.  GI: Soft. She exhibits no distension and no mass. There is no tenderness. There is no rebound and no guarding.  Skin: Skin is warm and dry.  Psychiatric: She has a normal mood and affect. Her behavior is normal.   Results for orders placed or performed during the hospital encounter of 08/12/14 (from the past 24 hour(s))  Urinalysis, Routine w reflex microscopic (not at [facility])     Status: Abnormal   Collection Time: 08/12/14 11:55 AM  Result Value Ref Range   Color, Urine YELLOW YELLOW   APPearance CLEAR CLEAR   Specific Gravity, Urine >1.030 (H) 1.005 - 1.030   pH 6.0 5.0 - 8.0   Glucose, UA NEGATIVE NEGATIVE mg/dL   Hgb urine dipstick NEGATIVE NEGATIVE   Bilirubin Urine SMALL (A) NEGATIVE   Ketones, ur >80 (A) NEGATIVE mg/dL   Protein, ur 30 (A) NEGATIVE mg/dL   Urobilinogen, UA 0.2 0.0 - 1.0 mg/dL   Nitrite NEGATIVE NEGATIVE   Leukocytes, UA NEGATIVE NEGATIVE  Urine microscopic-add on     Status: None   Collection Time: 08/12/14 11:55 AM  Result Value Ref Range   Squamous Epithelial / LPF RARE RARE   WBC, UA 0-2 <3 WBC/hpf  CBC     Status: Abnormal   Collection Time: 08/12/14 12:45 PM  Result Value Ref Range   WBC 4.8 4.0 - 10.5 K/uL   RBC 4.13 3.87 - 5.11 MIL/uL   Hemoglobin 14.0 12.0 - 15.0 g/dL   HCT 46.938.8 62.936.0 - 52.846.0 %   MCV 93.9 78.0 - 100.0 fL   MCH 33.9 26.0 - 34.0 pg   MCHC 36.1 (H) 30.0 - 36.0 g/dL   RDW 41.312.6 24.411.5 - 01.015.5 %   Platelets 185 150 - 400 K/uL  Comprehensive metabolic panel     Status: Abnormal   Collection Time: 08/12/14 12:45 PM  Result Value Ref Range   Sodium 133 (L) 135 - 145 mmol/L   Potassium 3.7 3.5 - 5.1 mmol/L   Chloride 103 101 - 111 mmol/L   CO2 22 22 - 32 mmol/L   Glucose, Bld 82 65 - 99 mg/dL   BUN 10 6 - 20 mg/dL   Creatinine, Ser 2.720.60 0.44 - 1.00 mg/dL    Calcium 9.2 8.9 - 53.610.3 mg/dL   Total Protein 8.2 (H) 6.5 - 8.1 g/dL   Albumin 4.1 3.5 - 5.0 g/dL   AST 26 15 -  41 U/L   ALT 28 14 - 54 U/L   Alkaline Phosphatase 35 (L) 38 - 126 U/L   Total Bilirubin 0.5 0.3 - 1.2 mg/dL   GFR calc non Af Amer >60 >60 mL/min   GFR calc Af Amer >60 >60 mL/min   Anion gap 8 5 - 15   MAU Course  Procedures  MDM IV hydration with 1 liter of D5LR/Phenergan  14:20  Patient is feeling a little better.    1 liter of LR with Multivitamins ordered 17:15  IV hydration complete.  Patient was able to keep soup, crackers down.  She is asking for something for GERD Pepcid  given in MAU List of safe meds provided to patient with discharge instructions  Assessment and Plan  A:  Hyperemesis     GERD  P:  Patient instructed to try Vit B 6 and Unisom--she cannot afford Diclegis and other meds have not controlled symptoms      Begin prenatal care with MD of choice-she plans to return to [facility]      KEY,EVE M 08/12/2014, 1:27 PM

## 2014-08-12 NOTE — MAU Note (Signed)
Discontinued IV 

## 2014-08-12 NOTE — Discharge Instructions (Signed)
Safe Medications in Pregnancy   Acne:  Benzoyl Peroxide  Salicylic Acid   Backache/Headache:  Tylenol: 2 regular strength every 4 hours OR        2 Extra strength every 6 hours   Colds/Coughs/Allergies:  Benadryl (alcohol free) 25 mg every 6 hours as needed  Breath right strips  Claritin  Cepacol throat lozenges  Chloraseptic throat spray  Cold-Eeze- up to three times per day  Cough drops, alcohol free  Flonase (by prescription only)  Guaifenesin  Mucinex  Robitussin DM (plain only, alcohol free)  Saline nasal spray/drops  Sudafed (pseudoephedrine) & Actifed * use only after [redacted] weeks gestation and if you do not have high blood pressure  Tylenol  Vicks Vaporub  Zinc lozenges  Zyrtec   Constipation:  Colace  Ducolax suppositories  Fleet enema  Glycerin suppositories  Metamucil  Milk of magnesia  Miralax  Senokot  Smooth move tea   Diarrhea:  Kaopectate  Imodium A-D   *NO pepto Bismol   Hemorrhoids:  Anusol  Anusol HC  Preparation H  Tucks   Indigestion:  Tums  Maalox  Mylanta  Zantac  Pepcid   Insomnia:  Benadryl (alcohol free)  every 6 hours as needed  Tylenol PM  Unisom, no Gelcaps   Leg Cramps:  Tums  MagGel   Nausea/Vomiting:  Bonine  Dramamine  Emetrol  Ginger extract  Sea bands  Meclizine  Nausea medication to take during pregnancy:  Unisom (doxylamine succinate 25 mg tablets) Take one tablet daily at bedtime. If symptoms are not adequately controlled, the dose can be increased to a maximum recommended dose of two tablets daily (1/2 tablet in the morning, 1/2 tablet mid-afternoon and one at bedtime).  Vitamin B6  tablets. Take one tablet twice a day (up to 200 mg per day).   Skin Rashes:  Aveeno products  Benadryl cream or  every 6 hours as needed  Calamine Lotion  1% cortisone cream   Yeast infection:  Gyne-lotrimin 7  Monistat 7    **If taking multiple medications, please check labels to avoid  duplicating the same active ingredients  **take medication as directed on the label  ** Do not exceed 4000 mg of tylenol in 24 hours  **Do not take medications that contain aspirin or ibuprofen        Hyperemesis Gravidarum Hyperemesis gravidarum is a severe form of nausea and vomiting that happens during pregnancy. Hyperemesis is worse than morning sickness. It may cause you to have nausea or vomiting all day for many days. It may keep you from eating and drinking enough food and liquids. Hyperemesis usually occurs during the first half (the first 20 weeks) of pregnancy. It often goes away once a woman is in her second half of pregnancy. However, sometimes hyperemesis continues through an entire pregnancy.  CAUSES  The cause of this condition is not completely known but is thought to be related to changes in the body's hormones when pregnant. It could be from the high level of the pregnancy hormone or an increase in estrogen in the body.  SIGNS AND SYMPTOMS   Severe nausea and vomiting.  Nausea that does not go away.  Vomiting that does not allow you to keep any food down.  Weight loss and body fluid loss (dehydration).  Having no desire to eat or not liking food you have previously enjoyed. DIAGNOSIS  Your health care provider will do a physical exam and ask you about your symptoms. He or she may  also order blood tests and urine tests to make sure something else is not causing the problem.  TREATMENT  You may only need medicine to control the problem. If medicines do not control the nausea and vomiting, you will be treated in the hospital to prevent dehydration, increased acid in the blood (acidosis), weight loss, and changes in the electrolytes in your body that may harm the unborn baby (fetus). You may need IV fluids.  HOME CARE INSTRUCTIONS   Only take over-the-counter or prescription medicines as directed by your health care provider.  Try eating a couple of dry crackers or  toast in the morning before getting out of bed.  Avoid foods and smells that upset your stomach.  Avoid fatty and spicy foods.  Eat 5-6 small meals a day.  Do not drink when eating meals. Drink between meals.  For snacks, eat high-protein foods, such as cheese.  Eat or suck on things that have ginger in them. Ginger helps nausea.  Avoid food preparation. The smell of food can spoil your appetite.  Avoid iron pills and iron in your multivitamins until after 3-4 months of being pregnant. However, consult with your health care provider before stopping any prescribed iron pills. SEEK MEDICAL CARE IF:   Your abdominal pain increases.  You have a severe headache.  You have vision problems.  You are losing weight. SEEK IMMEDIATE MEDICAL CARE IF:   You are unable to keep fluids down.  You vomit blood.  You have constant nausea and vomiting.  You have excessive weakness.  You have extreme thirst.  You have dizziness or fainting.  You have a fever or persistent symptoms for more than 2-3 days.  You have a fever and your symptoms suddenly get worse. MAKE SURE YOU:   Understand these instructions.  Will watch your condition.  Will get help right away if you are not doing well or get worse. Document Released: 02/22/2005 Document Revised: 12/13/2012 Document Reviewed: 10/04/2012 [facility] Patient Information 2015 [employer] [place]. This information is not intended to replace advice given to you by your health care provider. Make sure you discuss any questions you have with your health care provider.

## 2014-08-12 NOTE — MAU Note (Signed)
Here for nausea and vomiting. Still taking meds prescribed at last visit with no relief. Denies pain, bleeding or abnormal vaginal discharge.

## 2014-08-21 ENCOUNTER — Observation Stay (HOSPITAL_COMMUNITY)
Admission: AD | Admit: 2014-08-21 | Discharge: 2014-08-23 | Disposition: A | Discharge status: Home / Self Care | Payer: 59 | Source: Ambulatory Visit | Attending: Obstetrics and Gynecology | Admitting: Obstetrics and Gynecology

## 2014-08-21 ENCOUNTER — Encounter (HOSPITAL_COMMUNITY): Payer: Self-pay | Admitting: Advanced Practice Midwife

## 2014-08-21 DIAGNOSIS — Z3A11 11 weeks gestation of pregnancy: Secondary | ICD-10-CM | POA: Diagnosis not present

## 2014-08-21 DIAGNOSIS — R634 Abnormal weight loss: Secondary | ICD-10-CM | POA: Diagnosis not present

## 2014-08-21 DIAGNOSIS — O211 Hyperemesis gravidarum with metabolic disturbance: Secondary | ICD-10-CM | POA: Diagnosis not present

## 2014-08-21 DIAGNOSIS — R21 Rash and other nonspecific skin eruption: Secondary | ICD-10-CM | POA: Insufficient documentation

## 2014-08-21 DIAGNOSIS — O9989 Other specified diseases and conditions complicating pregnancy, childbirth and the puerperium: Secondary | ICD-10-CM | POA: Insufficient documentation

## 2014-08-21 DIAGNOSIS — Z87891 Personal history of nicotine dependence: Secondary | ICD-10-CM | POA: Diagnosis not present

## 2014-08-21 DIAGNOSIS — O219 Vomiting of pregnancy, unspecified: Secondary | ICD-10-CM | POA: Diagnosis present

## 2014-08-21 DIAGNOSIS — O210 Mild hyperemesis gravidarum: Secondary | ICD-10-CM | POA: Diagnosis present

## 2014-08-21 LAB — URINALYSIS, ROUTINE W REFLEX MICROSCOPIC
Glucose, UA: NEGATIVE mg/dL
Hgb urine dipstick: NEGATIVE
Ketones, ur: >80 mg/dL — AB
Leukocytes, UA: NEGATIVE
Nitrite: NEGATIVE
PH: 6 (ref 5.0–8.0)
Protein, ur: 30 mg/dL — AB
SPECIFIC GRAVITY, URINE: 1.025 (ref 1.005–1.030)
Urobilinogen, UA: 0.2 mg/dL (ref 0.0–1.0)

## 2014-08-21 LAB — URINE MICROSCOPIC-ADD ON

## 2014-08-21 MED ORDER — PROMETHAZINE HCL 25 MG/ML IJ SOLN
25.0000 mg | Freq: Once | INTRAVENOUS | Status: AC
  Administered 2014-08-21: 25 mg via INTRAVENOUS
  Filled 2014-08-21: qty 1

## 2014-08-21 NOTE — MAU Note (Signed)
First OB appt scheduled with Brandy Ferguson OB next Tuesday. Here for vomiting;has vomited more than 20 times in the last 24 hours; can't keep anything down. Has urinated since yesterday. Denies abdominal pain.  Denies vaginal bleeding.

## 2014-08-22 ENCOUNTER — Encounter (HOSPITAL_COMMUNITY): Payer: Self-pay | Admitting: *Deleted

## 2014-08-22 DIAGNOSIS — O219 Vomiting of pregnancy, unspecified: Secondary | ICD-10-CM | POA: Diagnosis present

## 2014-08-22 DIAGNOSIS — O211 Hyperemesis gravidarum with metabolic disturbance: Secondary | ICD-10-CM | POA: Diagnosis not present

## 2014-08-22 LAB — CBC
HCT: 32.8 % — ABNORMAL LOW (ref 36.0–46.0)
HEMOGLOBIN: 11.6 g/dL — AB (ref 12.0–15.0)
MCH: 33.6 pg (ref 26.0–34.0)
MCHC: 35.4 g/dL (ref 30.0–36.0)
MCV: 95.1 fL (ref 78.0–100.0)
Platelets: 193 10*3/uL (ref 150–400)
RBC: 3.45 MIL/uL — ABNORMAL LOW (ref 3.87–5.11)
RDW: 13 % (ref 11.5–15.5)
WBC: 4.4 10*3/uL (ref 4.0–10.5)

## 2014-08-22 LAB — URINALYSIS, ROUTINE W REFLEX MICROSCOPIC
Bilirubin Urine: NEGATIVE
Glucose, UA: 500 mg/dL — AB
Hgb urine dipstick: NEGATIVE
Ketones, ur: 15 mg/dL — AB
LEUKOCYTES UA: NEGATIVE
NITRITE: NEGATIVE
PH: 8.5 — AB (ref 5.0–8.0)
Protein, ur: NEGATIVE mg/dL
SPECIFIC GRAVITY, URINE: 1.01 (ref 1.005–1.030)
UROBILINOGEN UA: 0.2 mg/dL (ref 0.0–1.0)

## 2014-08-22 LAB — GLUCOSE, RANDOM: GLUCOSE: 105 mg/dL — AB (ref 65–99)

## 2014-08-22 MED ORDER — METHYLPREDNISOLONE 4 MG PO TABS: 4.0000 mg | ORAL_TABLET | Freq: Every day | ORAL | Status: DC

## 2014-08-22 MED ORDER — PRENATAL MULTIVITAMIN CH
1.0000 | ORAL_TABLET | Freq: Every day | ORAL | Status: DC
  Administered 2014-08-22: 1 via ORAL
  Filled 2014-08-22: qty 1

## 2014-08-22 MED ORDER — SODIUM CHLORIDE 0.9 % IV SOLN
Freq: Once | INTRAVENOUS | Status: AC
  Administered 2014-08-22: 02:00:00 via INTRAVENOUS
  Filled 2014-08-22: qty 1000

## 2014-08-22 MED ORDER — CALCIUM CARBONATE ANTACID 500 MG PO CHEW: 2.0000 | CHEWABLE_TABLET | ORAL | Status: DC | PRN

## 2014-08-22 MED ORDER — METHYLPREDNISOLONE 4 MG PO TABS: 8.0000 mg | ORAL_TABLET | Freq: Every day | ORAL | Status: DC

## 2014-08-22 MED ORDER — DEXTROSE-NACL 5-0.9 % IV SOLN
INTRAVENOUS | Status: DC
  Administered 2014-08-22 – 2014-08-23 (×7): via INTRAVENOUS

## 2014-08-22 MED ORDER — ACETAMINOPHEN 325 MG PO TABS
650.0000 mg | ORAL_TABLET | ORAL | Status: DC | PRN
  Administered 2014-08-22: 650 mg via ORAL
  Filled 2014-08-22: qty 2

## 2014-08-22 MED ORDER — METHYLPREDNISOLONE 16 MG PO TABS
16.0000 mg | ORAL_TABLET | Freq: Every day | ORAL | Status: DC
Start: 2014-08-23 — End: 2014-08-23
  Administered 2014-08-23: 16 mg via ORAL
  Filled 2014-08-22 (×2): qty 1

## 2014-08-22 MED ORDER — LACTATED RINGERS IV BOLUS (SEPSIS)
1000.0000 mL | Freq: Once | INTRAVENOUS | Status: AC
  Administered 2014-08-22: 1000 mL via INTRAVENOUS

## 2014-08-22 MED ORDER — METHYLPREDNISOLONE SODIUM SUCC 125 MG IJ SOLR
48.0000 mg | Freq: Once | INTRAMUSCULAR | Status: AC
  Administered 2014-08-22: 48 mg via INTRAVENOUS
  Filled 2014-08-22: qty 0.77

## 2014-08-22 MED ORDER — METOCLOPRAMIDE HCL 5 MG/ML IJ SOLN
10.0000 mg | Freq: Four times a day (QID) | INTRAMUSCULAR | Status: DC | PRN
Start: 2014-08-22 — End: 2014-08-23
  Administered 2014-08-22 – 2014-08-23 (×5): 10 mg via INTRAVENOUS
  Filled 2014-08-22 (×5): qty 2

## 2014-08-22 MED ORDER — CALCIUM CARBONATE ANTACID 500 MG PO CHEW
2.0000 | CHEWABLE_TABLET | ORAL | Status: DC | PRN
  Administered 2014-08-22: 400 mg via ORAL
  Filled 2014-08-22: qty 2

## 2014-08-22 MED ORDER — METHYLPREDNISOLONE 16 MG PO TABS
16.0000 mg | ORAL_TABLET | Freq: Every day | ORAL | Status: DC
  Filled 2014-08-22 (×2): qty 1

## 2014-08-22 MED ORDER — BOOST / RESOURCE BREEZE PO LIQD
237.0000 mL | Freq: Three times a day (TID) | ORAL | Status: DC
  Administered 2014-08-22: 0.9875 via ORAL
  Administered 2014-08-22 – 2014-08-23 (×2): 1 via ORAL
  Filled 2014-08-22 (×5): qty 1

## 2014-08-22 MED ORDER — METHYLPREDNISOLONE 16 MG PO TABS
16.0000 mg | ORAL_TABLET | Freq: Every day | ORAL | Status: DC
  Filled 2014-08-22: qty 1

## 2014-08-22 MED ORDER — OXYCODONE-ACETAMINOPHEN 5-325 MG PO TABS: 1.0000 | ORAL_TABLET | ORAL | Status: DC | PRN

## 2014-08-22 NOTE — Progress Notes (Signed)
Pharmacy Consult:   MEDROL (METHYLPREDNISOLONE) TAPER  FOR HYPEREMESIS GRAVIDARUM PATIENTS  The following is a 14 day taper of methylprednisolone for hyperemesis. Doses on day 1  will be given IV.  All doses starting on day 2  will be given PO. (If patient cannot tolerate oral medications, contact the pharmacy to change route to IV.)   Date Day Morning Midday Bedtime  6-16 1 48mg     6-17 2 16  mg 16 mg 16 mg  6-18 3 16  mg 16 mg 16 mg  6-19 4 16  mg 8 mg 16 mg  6-20 5 16  mg 8 mg 8 mg  6-21 6 8  mg 8 mg 8 mg  6-22 7 8  mg 4 mg 8 mg  6-23 8 8  mg 4 mg 4 mg  6-24 9 8  mg 4 mg   6-25 10 8  mg 4 mg   6-26 11 8  mg    6-27 12 8  mg    6-28 13 4  mg    6-29 14 4  mg     Check fasting blood sugars daily while on the taper. Notify MD if fasting blood sugar>95.  Brandy Ferguson 08/22/2014

## 2014-08-22 NOTE — H&P (Signed)
Brandy Ferguson is a 29 y.o. female presenting for a and vomiting in pregnancy 11 weeks, dehydration mild to moderate, with 10 pound weight loss to date. This or any-year-old gravida 3 para 2002 at [redacted] weeks gestations been seen for the sixth time this pregnancy and the MAU for nausea vomiting of pregnancy. She and usually has 80+ ketonuria and specific gravity usually greater Then 1.030,, currently 1.0251 the patient spitting up today. She is only voided once all day, tiny amount. She is received 2 units of IV fluids and has not yet voided. She'll be admitted for overnight fluid hydration and consideration of initiation of hyperemesis protocol History OB History    Gravida Para Term Preterm AB TAB SAB Ectopic Multiple Living   2 1 0       2     History reviewed. No pertinent past medical history. Past Surgical History  Procedure Laterality Date  . Cesarean section    . Appendectomy    . Wisdom tooth extraction     Family History: family history is negative for Alcohol abuse, Arthritis, Asthma, Birth defects, Cancer, COPD, Depression, Diabetes, Drug abuse, Early death, Hearing loss, Heart disease, Hyperlipidemia, Hypertension, Kidney disease, Learning disabilities, Mental illness, Mental retardation, Miscarriages / Stillbirths, Stroke, Vision loss, and Varicose Veins. Social History:  reports that she has quit smoking. She has never used smokeless tobacco. She reports that she does not drink alcohol or use illicit drugs.  ROS No bleeding. The nausea and vomiting   Blood pressure 117/66, pulse 72, temperature 98.4 F (36.9 C), temperature source Oral, resp. rate 18, height 5\' 3"  (1.6 m), weight 127 lb 9.6 oz (57.879 kg), last menstrual period 06/03/2014, SpO2 100 %. Exam Physical Exam  Constitutional: She is oriented to person, place, and time. She appears well-developed and well-nourished.  HENT:  Head: Normocephalic and atraumatic.  Eyes: Pupils are equal, round, and reactive to light.  Neck:  Normal range of motion. Neck supple.  Cardiovascular: Normal rate.   Respiratory: Effort normal.  GI: Soft.  Musculoskeletal: Normal range of motion.  Neurological: She is alert and oriented to person, place, and time.  Skin: Skin is warm and dry. Rash noted. No erythema.  Psychiatric: She has a normal mood and affect. Her behavior is normal. Thought content normal.    Prenatal labs: Hemoglobin 14 BUN 10 urine specific gravity 1.025 with 80 mg/dL ketonuria ABO, Rh:   Antibody:   Rubella:   RPR:    HBsAg:    HIV:    GBS:     Assessment/Plan: Pregnancy [redacted] weeks gestation and singleton intrauterine pregnancy, with nausea and vomiting of pregnancy unresponsive to Reglan Zofran and Phenergan orally and rectally for rehydration and consideration of placing on the hyperemesis  contract   Shawanna Zanders V 08/22/2014, 12:46 AM

## 2014-08-22 NOTE — Progress Notes (Signed)
Hospital day 0 for admission for Nausea and vomiting of early pregnancy at 11 wks Subjective: Pt desires food this morning will try on clear liquids  Patient reports nausea.  Has voided only once. Continues on IV D5NS as 250/hr.   Objective: I have reviewed patient's vital signs, intake and output, medications and labs.  General: alert, cooperative and no distress Resp: unlabored GI: nontender, hypoactive BS,   Assessment/Plan: Day 0 for N& V of pregnancy Plan: try clear liquid breakfast, and advance diet for lunch. If pt cannot tolerate advancing diet, will need to consider Steroid protocol.      Brandy Ferguson V 08/22/2014, 8:25 AM

## 2014-08-22 NOTE — MAU Provider Note (Signed)
History     CSN: [id number]  Arrival date and time: 08/21/14 2243   First Provider Initiated Contact with Patient 08/22/14 0002      Chief Complaint  Patient presents with  . Emesis During Pregnancy   HPI Comments: Brandy Ferguson is a 29 y.o G2P0002 at [redacted]w[redacted]d who presents today with nausea and vomiting. This is her 6th visit to MAU with this complaint. Her weight is stable from her last visit. Last seen on 08/12/14 and weight was 125, today it is 127. She has been using ODT zofran and phenergan suppositories. She states that once she starts vomiting she cannot do anything to stop the vomiting.   Emesis  This is a new problem. The current episode started more than 1 month ago. The problem occurs more than 10 times per day. The problem has been unchanged. The emesis has an appearance of stomach contents. There has been no fever. Pertinent negatives include no abdominal pain, diarrhea or fever. Risk factors: pregnant  Treatments tried: zofran, phenergan, reglan, vit b6 and unisom. The treatment provided no relief.    History reviewed. No pertinent past medical history.  Past Surgical History  Procedure Laterality Date  . Cesarean section    . Appendectomy    . Wisdom tooth extraction      Family History  Problem Relation Age of Onset  . Alcohol abuse Neg Hx   . Arthritis Neg Hx   . Asthma Neg Hx   . Birth defects Neg Hx   . Cancer Neg Hx   . COPD Neg Hx   . Depression Neg Hx   . Diabetes Neg Hx   . Drug abuse Neg Hx   . Early death Neg Hx   . Hearing loss Neg Hx   . Heart disease Neg Hx   . Hyperlipidemia Neg Hx   . Hypertension Neg Hx   . Kidney disease Neg Hx   . Learning disabilities Neg Hx   . Mental illness Neg Hx   . Mental retardation Neg Hx   . Miscarriages / Stillbirths Neg Hx   . Stroke Neg Hx   . Vision loss Neg Hx   . Varicose Veins Neg Hx     History  Substance Use Topics  . Smoking status: Former Smoker -- 0.50 packs/day  . Smokeless tobacco: Never Used   . Alcohol Use: No    Allergies:  Allergies  Allergen Reactions  . Bactrim [Sulfamethoxazole-Trimethoprim] Hives  . Flagyl [Metronidazole] Hives  . Lubricants Hives    KY jelly causes itching and hives  . Penicillins Other (See Comments)    From record as child.  Has had Amoxicillin as an adult without a reaction.    Prescriptions prior to admission  Medication Sig Dispense Refill Last Dose  . anti-nausea (EMETROL) solution Take 10 mLs by mouth every 15 (fifteen) minutes as needed for nausea or vomiting.   Past Month at Unknown time  . clindamycin (CLEOCIN) 150 MG capsule Take by mouth 3 (three) times daily.   08/20/2014 at Unknown time  . doxylamine, Sleep, (UNISOM) 25 MG tablet Take 25 mg by mouth at bedtime as needed.   08/21/2014 at 2000  . metoCLOPramide (REGLAN) 10 MG tablet Take 1 tablet (10 mg total) by mouth 3 (three) times daily before meals. 30 tablet 1 08/21/2014 at 1900  . Multiple Vitamin (MULTIVITAMIN WITH MINERALS) TABS tablet Take 1 tablet by mouth daily.   08/21/2014 at Unknown time  . ondansetron (ZOFRAN ODT) 4  MG disintegrating tablet Take 1 tablet (4 mg total) by mouth every 8 (eight) hours as needed for nausea or vomiting. 20 tablet 0 08/21/2014 at Unknown time  . promethazine (PHENERGAN) 25 MG suppository Place 1 suppository (25 mg total) rectally every 6 (six) hours as needed for nausea or vomiting. If unable to keep pills down 12 each 0 08/21/2014 at 2100  . promethazine (PHENERGAN) 25 MG tablet Take 0.5-1 tablets (12.5-25 mg total) by mouth every 6 (six) hours as needed. (Patient taking differently: Take 12.5-25 mg by mouth every 6 (six) hours as needed for nausea or vomiting. ) 30 tablet 0 08/21/2014 at Unknown time  . pyridOXINE (VITAMIN B-6) 25 MG tablet Take 25 mg by mouth daily.   08/21/2014 at 2000  . ranitidine (ZANTAC) 150 MG tablet Take 1 tablet (150 mg total) by mouth 2 (two) times daily. 60 tablet 1 08/21/2014 at Unknown time    Review of Systems   Constitutional: Negative for fever.  Gastrointestinal: Positive for nausea and vomiting. Negative for abdominal pain, diarrhea and constipation.  Genitourinary: Negative for dysuria, urgency and frequency.   Physical Exam   Blood pressure 117/66, pulse 72, temperature 98.4 F (36.9 C), temperature source Oral, resp. rate 18, height  (1.6 m), weight 57.879 kg (127 lb 9.6 oz), last menstrual period 06/03/2014, SpO2 100 %.  Physical Exam  Nursing note and vitals reviewed. Constitutional: She is oriented to person, place, and time. She appears well-developed and well-nourished. No distress.  HENT:  Head: Normocephalic.  Cardiovascular: Normal rate.   Respiratory: Effort normal.  GI: Soft. There is no tenderness. There is no rebound.  Neurological: She is alert and oriented to person, place, and time.  Skin: Skin is warm and dry.  Psychiatric: She has a normal mood and affect.    MAU Course  Procedures  MDM 0018: D/W Dr. Emelda Fear. He will come see the patient.  6578: Patient will be admit  to 3rd floor.   Assessment and Plan  Hyperemesis Admit to 3rd floor   Tawnya Crook 08/22/2014, 12:04 AM

## 2014-08-22 NOTE — Progress Notes (Signed)
Initial Nutrition Assessment   INTERVENTION:  Boost Breeze to supplement c/l diet  NUTRITION DIAGNOSIS:  Inadequate oral intake related to nausea, vomiting as evidenced by per patient/family report.   GOAL:  Patient will meet greater than or equal to 90% of their needs, Weight gain   MONITOR:  Weight trends  REASON FOR ASSESSMENT:   (Hyperemesis)    ASSESSMENT:   11 3/7 weeks IUP. 6th admission due to n/v and dehydration since 07/17/14. Weight's recorded in chart indicate stable weight, however there  Is a report of a 10 lb weight loss. This puts pt at high risk for malnutrition.  Resource Breeze po TID, each supplement provides 250 kcal and 9 grams of protein  Height:  Ht Readings from Last 1 Encounters:  08/21/14 5\' 3"  (1.6 m)    Weight:  Wt Readings from Last 1 Encounters:  08/21/14 127 lb 9.6 oz (57.879 kg)    Ideal Body Weight:     Wt Readings from Last 10 Encounters:  08/21/14 127 lb 9.6 oz (57.879 kg)  08/12/14 125 lb (56.7 kg)  08/02/14 127 lb 9.6 oz (57.879 kg)  07/26/14 129 lb (58.514 kg)  07/17/14 126 lb (57.153 kg)  02/16/12 143 lb (64.864 kg)    BMI:  Body mass index is 22.61 kg/(m^2).  Estimated Nutritional Needs:  Kcal:  1500-1700  Protein:  57-67 g  Fluid:  1.8 L  Diet Order:  Diet clear liquid Room service appropriate?: Yes; Fluid consistency:: Thin  EDUCATION NEEDS:  No education needs identified at this time  No intake or output data in the 24 hours ending 08/22/14 0806   Elisabeth Cara M.Odis Luster LDN Neonatal Nutrition Support Specialist/RD III Pager [phone]      Phone [phone]

## 2014-08-23 DIAGNOSIS — O219 Vomiting of pregnancy, unspecified: Secondary | ICD-10-CM

## 2014-08-23 DIAGNOSIS — O211 Hyperemesis gravidarum with metabolic disturbance: Secondary | ICD-10-CM | POA: Diagnosis not present

## 2014-08-23 LAB — GLUCOSE, CAPILLARY: Glucose-Capillary: 92 mg/dL (ref 65–99)

## 2014-08-23 MED ORDER — METHYLPREDNISOLONE 4 MG PO TABS: ORAL_TABLET | ORAL | Status: DC

## 2014-08-23 MED ORDER — METOCLOPRAMIDE HCL 10 MG PO TABS: 10.0000 mg | ORAL_TABLET | Freq: Three times a day (TID) | ORAL | Status: DC

## 2014-08-23 MED ORDER — PRENATAL MULTIVITAMIN CH: 1.0000 | ORAL_TABLET | Freq: Every day | ORAL | Status: DC

## 2014-08-23 NOTE — Discharge Instructions (Signed)
Eating Plan for Hyperemesis Gravidarum °Severe cases of hyperemesis gravidarum can lead to dehydration and malnutrition. The hyperemesis eating plan is one way to lessen the symptoms of nausea and vomiting. It is often used with prescribed medicines to control your symptoms.  °WHAT CAN I DO TO RELIEVE MY SYMPTOMS? °Listen to your body. Everyone is different and has different preferences. Find what works best for you. Some of the following things may help: °· Eat and drink slowly. °· Eat 5-6 small meals daily instead of 3 large meals.   °· Eat crackers before you get out of bed in the morning.   °· Starchy foods are usually well tolerated (such as cereal, toast, bread, potatoes, pasta, rice, and pretzels).   °· Ginger may help with nausea. Add ¼ tsp ground ginger to hot tea or choose ginger tea.   °· Try drinking 100% fruit juice or an electrolyte drink. °· Continue to take your prenatal vitamins as directed by your health care provider. If you are having trouble taking your prenatal vitamins, talk with your health care provider about different options. °· Include at least 1 serving of protein with your meals and snacks (such as meats or poultry, beans, nuts, eggs, or yogurt). Try eating a protein-rich snack before bed (such as cheese and crackers or a half turkey or peanut butter sandwich). °WHAT THINGS SHOULD I AVOID TO REDUCE MY SYMPTOMS? °The following things may help reduce your symptoms: °· Avoid foods with strong smells. Try eating meals in well-ventilated areas that are free of odors. °· Avoid drinking water or other beverages with meals. Try not to drink anything less than 30 minutes before and after meals. °· Avoid drinking more than 1 cup of fluid at a time. °· Avoid fried or high-fat foods, such as butter and cream sauces. °· Avoid spicy foods. °· Avoid skipping meals the best you can. Nausea can be more intense on an empty stomach. If you cannot tolerate food at that time, do not force it. Try sucking on  ice chips or other frozen items and make up the calories later. °· Avoid lying down within 2 hours after eating. °Document Released: 12/20/2006 Document Revised: 02/27/2013 Document Reviewed: 12/27/2012 °ExitCare® Patient Information ©2015 [employer]. This information is not intended to replace advice given to you by your health care provider. Make sure you discuss any questions you have with your health care provider. ° °Hyperemesis Gravidarum °Hyperemesis gravidarum is a severe form of nausea and vomiting that happens during pregnancy. Hyperemesis is worse than morning sickness. It may cause you to have nausea or vomiting all day for many days. It may keep you from eating and drinking enough food and liquids. Hyperemesis usually occurs during the first half (the first 20 weeks) of pregnancy. It often goes away once a woman is in her second half of pregnancy. However, sometimes hyperemesis continues through an entire pregnancy.  °CAUSES  °The cause of this condition is not completely known but is thought to be related to changes in the body's hormones when pregnant. It could be from the high level of the pregnancy hormone or an increase in estrogen in the body.  °SIGNS AND SYMPTOMS  °· Severe nausea and vomiting. °· Nausea that does not go away. °· Vomiting that does not allow you to keep any food down. °· Weight loss and body fluid loss (dehydration). °· Having no desire to eat or not liking food you have previously enjoyed. °DIAGNOSIS  °Your health care provider will do a physical exam   and ask you about your symptoms. He or she may also order blood tests and urine tests to make sure something else is not causing the problem.  °TREATMENT  °You may only need medicine to control the problem. If medicines do not control the nausea and vomiting, you will be treated in the hospital to prevent dehydration, increased acid in the blood (acidosis), weight loss, and changes in the electrolytes in your body that may harm  the unborn baby (fetus). You may need IV fluids.  °HOME CARE INSTRUCTIONS  °· Only take over-the-counter or prescription medicines as directed by your health care provider. °· Try eating a couple of dry crackers or toast in the morning before getting out of bed. °· Avoid foods and smells that upset your stomach. °· Avoid fatty and spicy foods. °· Eat 5-6 small meals a day. °· Do not drink when eating meals. Drink between meals. °· For snacks, eat high-protein foods, such as cheese. °· Eat or suck on things that have ginger in them. Ginger helps nausea. °· Avoid food preparation. The smell of food can spoil your appetite. °· Avoid iron pills and iron in your multivitamins until after 3-4 months of being pregnant. However, consult with your health care provider before stopping any prescribed iron pills. °SEEK MEDICAL CARE IF:  °· Your abdominal pain increases. °· You have a severe headache. °· You have vision problems. °· You are losing weight. °SEEK IMMEDIATE MEDICAL CARE IF:  °· You are unable to keep fluids down. °· You vomit blood. °· You have constant nausea and vomiting. °· You have excessive weakness. °· You have extreme thirst. °· You have dizziness or fainting. °· You have a fever or persistent symptoms for more than 2-3 days. °· You have a fever and your symptoms suddenly get worse. °MAKE SURE YOU:  °· Understand these instructions. °· Will watch your condition. °· Will get help right away if you are not doing well or get worse. °Document Released: 02/22/2005 Document Revised: 12/13/2012 Document Reviewed: 10/04/2012 °ExitCare® Patient Information ©2015 [employer]. This information is not intended to replace advice given to you by your health care provider. Make sure you discuss any questions you have with your health care provider. ° °

## 2014-08-23 NOTE — Discharge Summary (Signed)
Physician Discharge Summary  Patient ID: Brandy Ferguson MRN: [id number] DOB/AGE: Feb 08, 1986 29 y.o.  Admit date: 08/21/2014 Discharge date: 08/23/2014  Admission Diagnoses: nausea and vomiting in pregnancy  Discharge Diagnoses:  Active Problems:   Vomiting complicating pregnancy, antepartum   Nausea and vomiting during pregnancy prior to [redacted] weeks gestation   Discharged Condition: good  Hospital Course: Pt was admitted with nausea and emesis and greater than 10% weight loss.  She was on meds at home with continued N/V.  Pt was given IVF and started on Reglan and a medrol dose pack. She was able to eat last night and today.  She has had no recent nausea or emesis.  She reports that she feel ready for discharge.  She will be discharged to home to f/u with [facility].  She has an appt on Tues for a NOB visit.         Consults: None  Treatments: IV hydration  Discharge Exam: Blood pressure 103/60, pulse 77, temperature 98.9 F (37.2 C), temperature source Oral, resp. rate 16, height  (1.6 m), weight 143 lb 8 oz (65.091 kg), last menstrual period 06/03/2014, SpO2 99 %. General appearance: alert and no distress GI: soft, non-tender; bowel sounds normal; no masses,  no organomegaly Extremities: extremities normal, atraumatic, no cyanosis or edema     Disposition: 01-Home or Self Care  Discharge Instructions    Activity as tolerated - No restrictions    Complete by:  As directed      Call MD for:  persistant nausea and vomiting    Complete by:  As directed      Call MD for:  temperature >100.4    Complete by:  As directed      Diet general    Complete by:  As directed             Medication List    TAKE these medications        anti-nausea solution  Take 10 mLs by mouth every 15 (fifteen) minutes as needed for nausea or vomiting.     clindamycin 150 MG capsule  Commonly known as:  CLEOCIN  Take by mouth 3 (three) times daily.     doxylamine (Sleep) 25 MG tablet   Commonly known as:  UNISOM  Take 25 mg by mouth at bedtime as needed.     methylPREDNISolone 4 MG tablet  Commonly known as:  MEDROL  Per printed instructions.     metoCLOPramide 10 MG tablet  Commonly known as:  REGLAN  Take 1 tablet (10 mg total) by mouth 4 (four) times daily -  before meals and at bedtime.     multivitamin with minerals Tabs tablet  Take 1 tablet by mouth daily.     ondansetron 4 MG disintegrating tablet  Commonly known as:  ZOFRAN ODT  Take 1 tablet (4 mg total) by mouth every 8 (eight) hours as needed for nausea or vomiting.     prenatal multivitamin Tabs tablet  Take 1 tablet by mouth daily.     promethazine 25 MG tablet  Commonly known as:  PHENERGAN  Take 0.5-1 tablets (12.5-25 mg total) by mouth every 6 (six) hours as needed.     promethazine 25 MG suppository  Commonly known as:  PHENERGAN  Place 1 suppository (25 mg total) rectally every 6 (six) hours as needed for nausea or vomiting. If unable to keep pills down     pyridOXINE 25 MG tablet  Commonly known as:  VITAMIN B-6  Take 25 mg by mouth daily.     ranitidine 150 MG tablet  Commonly known as:  ZANTAC  Take 1 tablet (150 mg total) by mouth 2 (two) times daily.           Follow-up Information    Follow up with [facility].   Why:  has an appt on Tues of next week    Contact information:   [address]  [address] [place] [address] [phone]      Signed: Willodean Rosenthal 08/23/2014, 12:03 PM

## 2014-08-23 NOTE — Progress Notes (Signed)
Pt is discharged in the care of boyfriend, with R.N. Escort. Denies any pain or discomfort. Stated nausea was better.No equipment needed for home use.Discharged instructions were given to pt with good understanding. Stable.Marland Kitchen

## 2014-08-28 ENCOUNTER — Other Ambulatory Visit: Payer: Self-pay | Admitting: Obstetrics & Gynecology

## 2014-08-28 LAB — OB RESULTS CONSOLE GC/CHLAMYDIA
CHLAMYDIA, DNA PROBE: NEGATIVE
Gonorrhea: NEGATIVE

## 2014-08-29 LAB — OB RESULTS CONSOLE ABO/RH: RH TYPE: POSITIVE

## 2014-08-29 LAB — OB RESULTS CONSOLE RUBELLA ANTIBODY, IGM: Rubella: NON-IMMUNE/NOT IMMUNE

## 2014-08-29 LAB — OB RESULTS CONSOLE ANTIBODY SCREEN: Antibody Screen: NEGATIVE

## 2014-08-29 LAB — OB RESULTS CONSOLE HEPATITIS B SURFACE ANTIGEN: Hepatitis B Surface Ag: NEGATIVE

## 2014-08-29 LAB — CYTOLOGY - PAP

## 2014-08-29 LAB — OB RESULTS CONSOLE HIV ANTIBODY (ROUTINE TESTING): HIV: NONREACTIVE

## 2014-09-12 ENCOUNTER — Encounter (HOSPITAL_COMMUNITY): Payer: Self-pay | Admitting: *Deleted

## 2014-09-12 ENCOUNTER — Inpatient Hospital Stay (HOSPITAL_COMMUNITY)
Admission: AD | Admit: 2014-09-12 | Discharge: 2014-09-12 | Disposition: A | Discharge status: Home / Self Care | Payer: 59 | Source: Ambulatory Visit | Attending: Obstetrics and Gynecology | Admitting: Obstetrics and Gynecology

## 2014-09-12 DIAGNOSIS — R51 Headache: Secondary | ICD-10-CM | POA: Insufficient documentation

## 2014-09-12 DIAGNOSIS — Z87891 Personal history of nicotine dependence: Secondary | ICD-10-CM | POA: Diagnosis not present

## 2014-09-12 DIAGNOSIS — O218 Other vomiting complicating pregnancy: Secondary | ICD-10-CM

## 2014-09-12 DIAGNOSIS — O210 Mild hyperemesis gravidarum: Secondary | ICD-10-CM | POA: Insufficient documentation

## 2014-09-12 DIAGNOSIS — Z3A14 14 weeks gestation of pregnancy: Secondary | ICD-10-CM | POA: Diagnosis not present

## 2014-09-12 DIAGNOSIS — R109 Unspecified abdominal pain: Secondary | ICD-10-CM

## 2014-09-12 DIAGNOSIS — O9989 Other specified diseases and conditions complicating pregnancy, childbirth and the puerperium: Secondary | ICD-10-CM | POA: Insufficient documentation

## 2014-09-12 HISTORY — DX: Anemia, unspecified: D64.9

## 2014-09-12 LAB — URINALYSIS, ROUTINE W REFLEX MICROSCOPIC
Bilirubin Urine: NEGATIVE
Glucose, UA: NEGATIVE mg/dL
Hgb urine dipstick: NEGATIVE
KETONES UR: NEGATIVE mg/dL
Leukocytes, UA: NEGATIVE
Nitrite: NEGATIVE
PROTEIN: NEGATIVE mg/dL
Specific Gravity, Urine: 1.02 (ref 1.005–1.030)
Urobilinogen, UA: 0.2 mg/dL (ref 0.0–1.0)
pH: 8.5 — ABNORMAL HIGH (ref 5.0–8.0)

## 2014-09-12 MED ORDER — LACTATED RINGERS IV BOLUS (SEPSIS): 1000.0000 mL | Freq: Once | INTRAVENOUS | Status: DC

## 2014-09-12 MED ORDER — DIPHENHYDRAMINE HCL 50 MG/ML IJ SOLN
25.0000 mg | Freq: Once | INTRAMUSCULAR | Status: AC
  Administered 2014-09-12: 25 mg via INTRAVENOUS
  Filled 2014-09-12: qty 1

## 2014-09-12 MED ORDER — DEXAMETHASONE SODIUM PHOSPHATE 10 MG/ML IJ SOLN
10.0000 mg | Freq: Once | INTRAMUSCULAR | Status: AC
  Administered 2014-09-12: 10 mg via INTRAVENOUS
  Filled 2014-09-12: qty 1

## 2014-09-12 MED ORDER — ONDANSETRON 8 MG PO TBDP
8.0000 mg | ORAL_TABLET | Freq: Once | ORAL | Status: AC
  Administered 2014-09-12: 8 mg via ORAL
  Filled 2014-09-12: qty 1

## 2014-09-12 MED ORDER — LACTATED RINGERS IV BOLUS (SEPSIS)
1000.0000 mL | Freq: Once | INTRAVENOUS | Status: AC
  Administered 2014-09-12: 1000 mL via INTRAVENOUS

## 2014-09-12 MED ORDER — ONDANSETRON 4 MG PO TBDP: 4.0000 mg | ORAL_TABLET | Freq: Three times a day (TID) | ORAL | Status: DC | PRN

## 2014-09-12 MED ORDER — PROMETHAZINE HCL 25 MG/ML IJ SOLN: INTRAVENOUS | Status: DC

## 2014-09-12 MED ORDER — PROMETHAZINE HCL 25 MG/ML IJ SOLN
25.0000 mg | Freq: Once | INTRAVENOUS | Status: AC
  Administered 2014-09-12: 25 mg via INTRAVENOUS
  Filled 2014-09-12: qty 1

## 2014-09-12 NOTE — Discharge Instructions (Signed)
Eating Plan for Hyperemesis Gravidarum  Severe cases of hyperemesis gravidarum can lead to dehydration and malnutrition. The hyperemesis eating plan is one way to lessen the symptoms of nausea and vomiting. It is often used with prescribed medicines to control your symptoms.   WHAT CAN I DO TO RELIEVE MY SYMPTOMS?  Listen to your body. Everyone is different and has different preferences. Find what works best for you. Some of the following things may help:  · Eat and drink slowly.  · Eat 5-6 small meals daily instead of 3 large meals.    · Eat crackers before you get out of bed in the morning.    · Starchy foods are usually well tolerated (such as cereal, toast, bread, potatoes, pasta, rice, and pretzels).    · Ginger may help with nausea. Add ¼ tsp ground ginger to hot tea or choose ginger tea.    · Try drinking 100% fruit juice or an electrolyte drink.  · Continue to take your prenatal vitamins as directed by your health care provider. If you are having trouble taking your prenatal vitamins, talk with your health care provider about different options.  · Include at least 1 serving of protein with your meals and snacks (such as meats or poultry, beans, nuts, eggs, or yogurt). Try eating a protein-rich snack before bed (such as cheese and crackers or a half turkey or peanut butter sandwich).  WHAT THINGS SHOULD I AVOID TO REDUCE MY SYMPTOMS?  The following things may help reduce your symptoms:  · Avoid foods with strong smells. Try eating meals in well-ventilated areas that are free of odors.  · Avoid drinking water or other beverages with meals. Try not to drink anything less than 30 minutes before and after meals.  · Avoid drinking more than 1 cup of fluid at a time.  · Avoid fried or high-fat foods, such as butter and cream sauces.  · Avoid spicy foods.  · Avoid skipping meals the best you can. Nausea can be more intense on an empty stomach. If you cannot tolerate food at that time, do not force it. Try sucking on  ice chips or other frozen items and make up the calories later.  · Avoid lying down within 2 hours after eating.  Document Released: 12/20/2006 Document Revised: 02/27/2013 Document Reviewed: 12/27/2012  Exit[employer]® Patient Information ©2015 [employer]. This information is not intended to replace advice given to you by your health care provider. Make sure you discuss any questions you have with your health care provider.

## 2014-09-12 NOTE — MAU Provider Note (Addendum)
History     CSN: [id number]  Arrival date and time: 09/12/14 1455   None     Chief Complaint  Patient presents with  . Emesis During Pregnancy  . Abdominal Pain   HPI Pt is G2P1 at 613w3d pregnant who presents with hyperemesis in pregnancy. Pt has been seen ~5 times with hyperemesis and was on steroid taper pack, which was helping until 2 days ago when prescription ran out and was not renewed by Dr. Mora ApplPinn.  Pt states the Reglan and steroid taper pack helped the most- pt has also been taking phenergan and Diclegis but it is not helping.  Pt was given one dose of zofran when she was here last time but not a prescription.  RN note:  09/12/2014 3:13 PM    Expand All Collapse All   Pt states she started having Nausea and vomiting last night and is unable to keep anything down including fluids. Pt states her headache and abd pain started at the same time. Denies vaginal bleeding or abnormal discharge.       Past Medical History  Diagnosis Date  . Anemia     Past Surgical History  Procedure Laterality Date  . Cesarean section    . Appendectomy    . Wisdom tooth extraction      Family History  Problem Relation Age of Onset  . Alcohol abuse Neg Hx   . Arthritis Neg Hx   . Asthma Neg Hx   . Birth defects Neg Hx   . Cancer Neg Hx   . COPD Neg Hx   . Depression Neg Hx   . Diabetes Neg Hx   . Drug abuse Neg Hx   . Early death Neg Hx   . Hearing loss Neg Hx   . Heart disease Neg Hx   . Hyperlipidemia Neg Hx   . Hypertension Neg Hx   . Kidney disease Neg Hx   . Learning disabilities Neg Hx   . Mental illness Neg Hx   . Mental retardation Neg Hx   . Miscarriages / Stillbirths Neg Hx   . Stroke Neg Hx   . Vision loss Neg Hx   . Varicose Veins Neg Hx     History  Substance Use Topics  . Smoking status: Former Smoker -- 0.50 packs/day  . Smokeless tobacco: Never Used  . Alcohol Use: No    Allergies:  Allergies  Allergen Reactions  . Bactrim  [Sulfamethoxazole-Trimethoprim] Hives  . Flagyl [Metronidazole] Hives  . Lubricants Hives    KY jelly causes itching and hives  . Penicillins Other (See Comments)    From record as child.  Has had Amoxicillin as an adult without a reaction.    Prescriptions prior to admission  Medication Sig Dispense Refill Last Dose  . anti-nausea (EMETROL) solution Take 10 mLs by mouth every 15 (fifteen) minutes as needed for nausea or vomiting.   Past Month at Unknown time  . clindamycin (CLEOCIN) 150 MG capsule Take by mouth 3 (three) times daily.   08/20/2014 at Unknown time  . doxylamine, Sleep, (UNISOM) 25 MG tablet Take 25 mg by mouth at bedtime as needed.   08/21/2014 at 2000  . methylPREDNISolone (MEDROL) 4 MG tablet Per printed instructions. 61 tablet 0   . metoCLOPramide (REGLAN) 10 MG tablet Take 1 tablet (10 mg total) by mouth 4 (four) times daily -  before meals and at bedtime. 30 tablet 1   . Multiple Vitamin (MULTIVITAMIN WITH MINERALS) TABS tablet  Take 1 tablet by mouth daily.   08/21/2014 at Unknown time  . ondansetron (ZOFRAN ODT) 4 MG disintegrating tablet Take 1 tablet (4 mg total) by mouth every 8 (eight) hours as needed for nausea or vomiting. 20 tablet 0   . Prenatal Vit-Fe Fumarate-FA (PRENATAL MULTIVITAMIN) TABS tablet Take 1 tablet by mouth daily. 30 tablet 1   . promethazine (PHENERGAN) 25 MG suppository Place 1 suppository (25 mg total) rectally every 6 (six) hours as needed for nausea or vomiting. If unable to keep pills down 12 each 0 08/21/2014 at 2100  . promethazine (PHENERGAN) 25 MG tablet Take 0.5-1 tablets (12.5-25 mg total) by mouth every 6 (six) hours as needed. (Patient taking differently: Take 12.5-25 mg by mouth every 6 (six) hours as needed for nausea or vomiting. ) 30 tablet 0 08/21/2014 at Unknown time  . pyridOXINE (VITAMIN B-6) 25 MG tablet Take 25 mg by mouth daily.   08/21/2014 at 2000  . ranitidine (ZANTAC) 150 MG tablet Take 1 tablet (150 mg total) by mouth 2 (two)  times daily. 60 tablet 1 08/21/2014 at Unknown time    Review of Systems  Constitutional: Negative for fever and chills.  Gastrointestinal: Positive for nausea, vomiting and abdominal pain. Negative for diarrhea and constipation.  Genitourinary: Negative for dysuria and urgency.   Physical Exam   Blood pressure 97/56, pulse 74, temperature 97.6 F (36.4 C), temperature source Oral, resp. rate 18, last menstrual period 06/03/2014, SpO2 100 %.  Physical Exam  Nursing note and vitals reviewed. Constitutional: She is oriented to person, place, and time. She appears well-developed and well-nourished. No distress.  HENT:  Head: Normocephalic.  Eyes: Pupils are equal, round, and reactive to light.  Neck: Normal range of motion. Neck supple.  Cardiovascular: Normal rate.   Respiratory: Effort normal.  GI: Soft.  Musculoskeletal: Normal range of motion.  Neurological: She is alert and oriented to person, place, and time.  Skin: Skin is warm and dry.  Psychiatric: She has a normal mood and affect.    MAU Course  Procedures Results for orders placed or performed during the hospital encounter of 09/12/14 (from the past 24 hour(s))  Urinalysis, Routine w reflex microscopic (not at [facility])     Status: Abnormal   Collection Time: 09/12/14  3:05 PM  Result Value Ref Range   Color, Urine YELLOW YELLOW   APPearance CLOUDY (A) CLEAR   Specific Gravity, Urine 1.020 1.005 - 1.030   pH 8.5 (H) 5.0 - 8.0   Glucose, UA NEGATIVE NEGATIVE mg/dL   Hgb urine dipstick NEGATIVE NEGATIVE   Bilirubin Urine NEGATIVE NEGATIVE   Ketones, ur NEGATIVE NEGATIVE mg/dL   Protein, ur NEGATIVE NEGATIVE mg/dL   Urobilinogen, UA 0.2 0.0 - 1.0 mg/dL   Nitrite NEGATIVE NEGATIVE   Leukocytes, UA NEGATIVE NEGATIVE   Discussed with Dr. Dareen Piano Will give IVFw/phenergan (per pt request) and Zofran Pt then complained of headache and  Requests medication Benadryl and decadron IV ordered Care handed over to Rodena Piety, CNM  Assessment and Plan    LINEBERRY,SUSAN 09/12/2014, 3:25 PM   Headache gone and nausea under control.  Was able tolerate PO liquids.  Discussed with Dr. Dareen Piano.  DC home with zofran rx.

## 2014-09-12 NOTE — MAU Note (Signed)
Pt states she started having Nausea and vomiting last night and is unable to keep anything down including fluids.  Pt states her headache and abd pain started at the same time.  Denies vaginal bleeding or abnormal discharge.

## 2014-10-09 ENCOUNTER — Inpatient Hospital Stay (HOSPITAL_COMMUNITY): Payer: 59

## 2014-10-09 ENCOUNTER — Inpatient Hospital Stay (HOSPITAL_COMMUNITY)
Admission: AD | Admit: 2014-10-09 | Discharge: 2014-10-09 | Disposition: A | Discharge status: Home / Self Care | Payer: 59 | Source: Ambulatory Visit | Attending: Obstetrics and Gynecology | Admitting: Obstetrics and Gynecology

## 2014-10-09 ENCOUNTER — Encounter (HOSPITAL_COMMUNITY): Payer: Self-pay | Admitting: *Deleted

## 2014-10-09 DIAGNOSIS — R109 Unspecified abdominal pain: Secondary | ICD-10-CM | POA: Insufficient documentation

## 2014-10-09 DIAGNOSIS — O209 Hemorrhage in early pregnancy, unspecified: Secondary | ICD-10-CM | POA: Insufficient documentation

## 2014-10-09 DIAGNOSIS — Z87891 Personal history of nicotine dependence: Secondary | ICD-10-CM | POA: Diagnosis not present

## 2014-10-09 DIAGNOSIS — Z3A18 18 weeks gestation of pregnancy: Secondary | ICD-10-CM | POA: Insufficient documentation

## 2014-10-09 DIAGNOSIS — O4692 Antepartum hemorrhage, unspecified, second trimester: Secondary | ICD-10-CM | POA: Diagnosis not present

## 2014-10-09 LAB — URINALYSIS, ROUTINE W REFLEX MICROSCOPIC
BILIRUBIN URINE: NEGATIVE
GLUCOSE, UA: NEGATIVE mg/dL
Hgb urine dipstick: NEGATIVE
Ketones, ur: NEGATIVE mg/dL
LEUKOCYTES UA: NEGATIVE
Nitrite: NEGATIVE
Protein, ur: NEGATIVE mg/dL
SPECIFIC GRAVITY, URINE: 1.015 (ref 1.005–1.030)
UROBILINOGEN UA: 0.2 mg/dL (ref 0.0–1.0)
pH: 7 (ref 5.0–8.0)

## 2014-10-09 LAB — WET PREP, GENITAL
Clue Cells Wet Prep HPF POC: NONE SEEN
TRICH WET PREP: NONE SEEN
Yeast Wet Prep HPF POC: NONE SEEN

## 2014-10-09 NOTE — MAU Note (Signed)
Earlier today, was cramping really really bad, went to the bathroom and noted a lot of bleeding, passed a few clots.  Is still cramping.  No hx of previa, did have hx of subchorionic hemorrhage.

## 2014-10-09 NOTE — MAU Provider Note (Signed)
Chief Complaint: Abdominal Cramping and Vaginal Bleeding   None     SUBJECTIVE HPI: Brandy Ferguson is a 29 y.o. G3P1001 at [redacted]w[redacted]d by LMP who presents to maternity admissions reporting sharp abdominal cramping followed by bright red vaginal bleeding with clots x 1 episode at home and continued light spotting upon arrival in MAU.  Pt reports she went to the bathroom to urinate and noted bright red bleeding when wiping and saw blood in the toilet.  She reports she put on a pantyliner and has seen bright red spotting on this since the episode in the bathroom.  She was diagnosed with subchorionic hemorrhage in early pregnancy that resolved by 12 weeks on ultrasound.  She denies other complications with this pregnancy. She denies vaginal itching/burning, urinary symptoms, h/a, dizziness, n/v, or fever/chills.     Abdominal Cramping This is a new problem. The current episode started today. The onset quality is sudden. The problem occurs intermittently. The problem has been waxing and waning. The pain is located in the LLQ and RLQ. The pain is severe. The quality of the pain is cramping. The abdominal pain does not radiate. Associated symptoms include dysuria and vomiting. Pertinent negatives include no constipation, diarrhea, fever, frequency, headaches or nausea. She has tried nothing for the symptoms.  Vaginal Bleeding The patient's primary symptoms include vaginal bleeding. This is a new problem. The current episode started today. The problem occurs intermittently. The problem has been gradually improving. The pain is moderate. She is pregnant. Associated symptoms include dysuria and vomiting. Pertinent negatives include no chills, constipation, diarrhea, fever, frequency, headaches, nausea or urgency. The vaginal bleeding is lighter than menses. She has been passing clots. She has not been passing tissue. The symptoms are aggravated by bowel movements. She has tried nothing for the symptoms.    Past Medical  History  Diagnosis Date  . Anemia    Past Surgical History  Procedure Laterality Date  . Cesarean section    . Appendectomy    . Wisdom tooth extraction     History   Social History  . Marital Status: Single    Spouse Name: N/A  . Number of Children: N/A  . Years of Education: N/A   Occupational History  . Not on file.   Social History Main Topics  . Smoking status: Former Smoker -- 0.50 packs/day  . Smokeless tobacco: Never Used  . Alcohol Use: No  . Drug Use: No  . Sexual Activity: Yes    Birth Control/ Protection: None   Other Topics Concern  . Not on file   Social History Narrative   No current facility-administered medications on file prior to encounter.   Current Outpatient Prescriptions on File Prior to Encounter  Medication Sig Dispense Refill  . Ca Carbonate-Mag Hydroxide (ROLAIDS PO) Take 1 tablet by mouth 4 (four) times daily as needed (For heartburn).    . ondansetron (ZOFRAN ODT) 4 MG disintegrating tablet Take 1 tablet (4 mg total) by mouth every 8 (eight) hours as needed for nausea or vomiting. 30 tablet 1  . Prenatal Vit-Fe Fumarate-FA (PRENATAL MULTIVITAMIN) TABS tablet Take 1 tablet by mouth daily. 30 tablet 1   Allergies  Allergen Reactions  . Bactrim [Sulfamethoxazole-Trimethoprim] Hives  . Flagyl [Metronidazole] Hives  . Lubricants Hives    KY jelly causes itching and hives  . Penicillins Other (See Comments)    From record as child.  Has had Amoxicillin as an adult without a reaction.    Review of Systems  Constitutional: Negative for fever, chills and malaise/fatigue.  Eyes: Negative for blurred vision.  Respiratory: Negative for cough and shortness of breath.   Cardiovascular: Negative for chest pain.  Gastrointestinal: Positive for vomiting. Negative for heartburn, nausea, diarrhea and constipation.  Genitourinary: Positive for dysuria and vaginal bleeding. Negative for urgency and frequency.  Musculoskeletal: Negative.    Neurological: Negative for dizziness and headaches.  Psychiatric/Behavioral: Negative for depression.    OBJECTIVE Blood pressure 116/71, pulse 64, temperature 97.3 F (36.3 C), temperature source Oral, resp. rate 16, height 5' 3.5" (1.613 m), weight 67.223 kg (148 lb 3.2 oz), last menstrual period 06/03/2014. GENERAL: Well-developed, well-nourished female in no acute distress.  EYES: normal sclera/conjunctiva; no lid-lag HENT: Atraumatic, normocephalic HEART: normal rate RESP: normal effort ABDOMEN: Soft, non-tender MUSCULOSKELETAL: Normal ROM EXTREMITIES: Nontender, no edema NEURO/PSYCH: Alert and oriented, appropriate affect  PELVIC EXAM: Cervix pink, visually closed, without lesion, scant white creamy discharge, no blood noted, vaginal walls and external genitalia normal  Cervix 0/long/high, posterior, no blood on glove following exam  FHT 155 by doppler  LAB RESULTS Results for orders placed or performed during the hospital encounter of 10/09/14 (from the past 24 hour(s))  Urinalysis, Routine w reflex microscopic (not at [facility])     Status: Abnormal   Collection Time: 10/09/14  3:45 PM  Result Value Ref Range   Color, Urine STRAW (A) YELLOW   APPearance CLEAR CLEAR   Specific Gravity, Urine 1.015 1.005 - 1.030   pH 7.0 5.0 - 8.0   Glucose, UA NEGATIVE NEGATIVE mg/dL   Hgb urine dipstick NEGATIVE NEGATIVE   Bilirubin Urine NEGATIVE NEGATIVE   Ketones, ur NEGATIVE NEGATIVE mg/dL   Protein, ur NEGATIVE NEGATIVE mg/dL   Urobilinogen, UA 0.2 0.0 - 1.0 mg/dL   Nitrite NEGATIVE NEGATIVE   Leukocytes, UA NEGATIVE NEGATIVE  Wet prep, genital     Status: Abnormal   Collection Time: 10/09/14  4:15 PM  Result Value Ref Range   Yeast Wet Prep HPF POC NONE SEEN NONE SEEN   Trich, Wet Prep NONE SEEN NONE SEEN   Clue Cells Wet Prep HPF POC NONE SEEN NONE SEEN   WBC, Wet Prep HPF POC FEW (A) NONE SEEN       IMAGING  Preliminary report with normal FHR, fluid level, placenta,  and cervical length  ASSESSMENT 1. Vaginal bleeding in pregnancy, second trimester     PLAN Consult Dr Henderson Cloud, reviewed assessment, labs, imaging Discharge home with bleeding precautions    Follow-up Information    Follow up with Chiniqua Kilcrease A, MD.   Specialty:  Obstetrics and Gynecology   Why:  As scheduled   Contact information:   [address] [place] [address] [phone]       Follow up with [facility] [place] [facility].   Why:  As needed for emergencies   Contact information:   [address] [id number] mc [place] [address] [phone]      Sharen Counter Certified Nurse-Midwife 10/09/2014  8:30 PM

## 2014-10-09 NOTE — Discharge Instructions (Signed)
Vaginal Bleeding During Pregnancy, Second Trimester °A small amount of bleeding (spotting) from the vagina is relatively common in pregnancy. It usually stops on its own. Various things can cause bleeding or spotting in pregnancy. Some bleeding may be related to the pregnancy, and some may not. Sometimes the bleeding is normal and is not a problem. However, bleeding can also be a sign of something serious. Be sure to tell your health care provider about any vaginal bleeding right away. °Some possible causes of vaginal bleeding during the second trimester include: °· Infection, inflammation, or growths on the cervix.   °· The placenta may be partially or completely covering the opening of the cervix inside the uterus (placenta previa). °· The placenta may have separated from the uterus (abruption of the placenta).   °· You may be having early (preterm) labor.   °· The cervix may not be strong enough to keep a baby inside the uterus (cervical insufficiency).   °· Tiny cysts may have developed in the uterus instead of pregnancy tissue (molar pregnancy).  °HOME CARE INSTRUCTIONS  °Watch your condition for any changes. The following actions may help to lessen any discomfort you are feeling: °· Follow your health care provider's instructions for limiting your activity. If your health care provider orders bed rest, you may need to stay in bed and only get up to use the bathroom. However, your health care provider may allow you to continue light activity. °· If needed, make plans for someone to help with your regular activities and responsibilities while you are on bed rest. °· Keep track of the number of pads you use each day, how often you change pads, and how soaked (saturated) they are. Write this down. °· Do not use tampons. Do not douche. °· Do not have sexual intercourse or orgasms until approved by your health care provider. °· If you pass any tissue from your vagina, save the tissue so you can show it to your  health care provider. °· Only take over-the-counter or prescription medicines as directed by your health care provider. °· Do not take aspirin because it can make you bleed. °· Do not exercise or perform any strenuous activities or heavy lifting without your health care provider's permission. °· Keep all follow-up appointments as directed by your health care provider. °SEEK MEDICAL CARE IF: °· You have any vaginal bleeding during any part of your pregnancy. °· You have cramps or labor pains. °· You have a fever, not controlled by medicine. °SEEK IMMEDIATE MEDICAL CARE IF:  °· You have severe cramps in your back or belly (abdomen). °· You have contractions. °· You have chills. °· You pass large clots or tissue from your vagina. °· Your bleeding increases. °· You feel light-headed or weak, or you have fainting episodes. °· You are leaking fluid or have a gush of fluid from your vagina. °MAKE SURE YOU: °· Understand these instructions. °· Will watch your condition. °· Will get help right away if you are not doing well or get worse. °Document Released: 12/02/2004 Document Revised: 02/27/2013 Document Reviewed: 10/30/2012 °Exit[employer]® Patient Information ©2015 [employer]. This information is not intended to replace advice given to you by your health care provider. Make sure you discuss any questions you have with your health care provider. ° °

## 2014-10-10 LAB — GC/CHLAMYDIA PROBE AMP (~~LOC~~) NOT AT ARMC
CHLAMYDIA, DNA PROBE: NEGATIVE
Neisseria Gonorrhea: NEGATIVE

## 2014-11-22 ENCOUNTER — Encounter (HOSPITAL_COMMUNITY): Payer: Self-pay | Admitting: *Deleted

## 2014-11-22 ENCOUNTER — Inpatient Hospital Stay (HOSPITAL_COMMUNITY)
Admission: AD | Admit: 2014-11-22 | Discharge: 2014-11-22 | Disposition: A | Discharge status: Home / Self Care | Payer: Medicaid Other | Source: Ambulatory Visit | Attending: Obstetrics and Gynecology | Admitting: Obstetrics and Gynecology

## 2014-11-22 DIAGNOSIS — O212 Late vomiting of pregnancy: Secondary | ICD-10-CM | POA: Insufficient documentation

## 2014-11-22 DIAGNOSIS — O219 Vomiting of pregnancy, unspecified: Secondary | ICD-10-CM | POA: Diagnosis not present

## 2014-11-22 DIAGNOSIS — Z3A24 24 weeks gestation of pregnancy: Secondary | ICD-10-CM | POA: Diagnosis not present

## 2014-11-22 DIAGNOSIS — Z87891 Personal history of nicotine dependence: Secondary | ICD-10-CM | POA: Diagnosis not present

## 2014-11-22 LAB — CBC WITH DIFFERENTIAL/PLATELET
Basophils Absolute: 0 10*3/uL (ref 0.0–0.1)
Basophils Relative: 0 %
EOS ABS: 0 10*3/uL (ref 0.0–0.7)
EOS PCT: 1 %
HCT: 34.2 % — ABNORMAL LOW (ref 36.0–46.0)
Hemoglobin: 11.7 g/dL — ABNORMAL LOW (ref 12.0–15.0)
LYMPHS ABS: 1.5 10*3/uL (ref 0.7–4.0)
LYMPHS PCT: 24 %
MCH: 33.4 pg (ref 26.0–34.0)
MCHC: 34.2 g/dL (ref 30.0–36.0)
MCV: 97.7 fL (ref 78.0–100.0)
MONOS PCT: 8 %
Monocytes Absolute: 0.5 10*3/uL (ref 0.1–1.0)
Neutro Abs: 4.1 10*3/uL (ref 1.7–7.7)
Neutrophils Relative %: 67 %
Platelets: 136 10*3/uL — ABNORMAL LOW (ref 150–400)
RBC: 3.5 MIL/uL — AB (ref 3.87–5.11)
RDW: 13.4 % (ref 11.5–15.5)
WBC: 6.1 10*3/uL (ref 4.0–10.5)

## 2014-11-22 LAB — COMPREHENSIVE METABOLIC PANEL
ALT: 13 U/L — AB (ref 14–54)
AST: 15 U/L (ref 15–41)
Albumin: 3.2 g/dL — ABNORMAL LOW (ref 3.5–5.0)
Alkaline Phosphatase: 38 U/L (ref 38–126)
Anion gap: 12 (ref 5–15)
BUN: 7 mg/dL (ref 6–20)
CHLORIDE: 101 mmol/L (ref 101–111)
CO2: 20 mmol/L — AB (ref 22–32)
Calcium: 8.7 mg/dL — ABNORMAL LOW (ref 8.9–10.3)
Creatinine, Ser: 0.47 mg/dL (ref 0.44–1.00)
GFR calc Af Amer: >60 mL/min (ref >60)
GFR calc non Af Amer: >60 mL/min (ref >60)
Glucose, Bld: 68 mg/dL (ref 65–99)
Potassium: 3.4 mmol/L — ABNORMAL LOW (ref 3.5–5.1)
Sodium: 133 mmol/L — ABNORMAL LOW (ref 135–145)
Total Bilirubin: 0.5 mg/dL (ref 0.3–1.2)
Total Protein: 6.7 g/dL (ref 6.5–8.1)

## 2014-11-22 LAB — URINALYSIS, ROUTINE W REFLEX MICROSCOPIC
Bilirubin Urine: NEGATIVE
GLUCOSE, UA: NEGATIVE mg/dL
Hgb urine dipstick: NEGATIVE
Ketones, ur: >80 mg/dL — AB
Leukocytes, UA: NEGATIVE
Nitrite: NEGATIVE
PROTEIN: NEGATIVE mg/dL
Specific Gravity, Urine: 1.025 (ref 1.005–1.030)
UROBILINOGEN UA: 0.2 mg/dL (ref 0.0–1.0)
pH: 6.5 (ref 5.0–8.0)

## 2014-11-22 LAB — TSH: TSH: 0.482 u[IU]/mL (ref 0.350–4.500)

## 2014-11-22 MED ORDER — ONDANSETRON 4 MG PO TBDP: 4.0000 mg | ORAL_TABLET | Freq: Four times a day (QID) | ORAL | Status: DC | PRN

## 2014-11-22 MED ORDER — SODIUM CHLORIDE 0.9 % IV BOLUS (SEPSIS)
1000.0000 mL | Freq: Once | INTRAVENOUS | Status: AC
  Administered 2014-11-22: 1000 mL via INTRAVENOUS

## 2014-11-22 MED ORDER — ONDANSETRON HCL 40 MG/20ML IJ SOLN
8.0000 mg | Freq: Once | INTRAMUSCULAR | Status: AC
  Administered 2014-11-22: 8 mg via INTRAVENOUS
  Filled 2014-11-22: qty 4

## 2014-11-22 MED ORDER — METHYLPREDNISOLONE SODIUM SUCC 40 MG IJ SOLR
16.0000 mg | Freq: Once | INTRAMUSCULAR | Status: AC
  Administered 2014-11-22: 16 mg via INTRAVENOUS
  Filled 2014-11-22: qty 0.4

## 2014-11-22 MED ORDER — PREDNISONE 5 MG PO TABS: ORAL_TABLET | ORAL | Status: DC

## 2014-11-22 NOTE — MAU Provider Note (Signed)
MAU HISTORY AND PHYSICAL  Chief Complaint:  Nausea   Brandy Ferguson is a 29 y.o.  G2P0001 with IUP at [redacted]w[redacted]d presenting for Nausea  Seen at [facility] today for regular appointment and referred here. Has had nausea/vomiting for much of this pregnancy but not the past month. Takes reglan from time to time. Two days now of nausea and vomiting. Not as bad as previously in pregnancy. Today has vomited 5 times. No blood or bile. Unable to tolerate PO. Has urinated once today. No diarrhea. No abdominal pain. No fever or chills. Positive fetal movement, no vaginal bleeding or leakage of fluid. Took reglan and zofran, didn't help. STeroids helped in the past. Saw Dr. Henderson Cloud today.   Past Medical History  Diagnosis Date  . Anemia     Past Surgical History  Procedure Laterality Date  . Cesarean section    . Appendectomy    . Wisdom tooth extraction      Family History  Problem Relation Age of Onset  . Alcohol abuse Neg Hx   . Arthritis Neg Hx   . Asthma Neg Hx   . Birth defects Neg Hx   . Cancer Neg Hx   . COPD Neg Hx   . Depression Neg Hx   . Diabetes Neg Hx   . Drug abuse Neg Hx   . Early death Neg Hx   . Hearing loss Neg Hx   . Heart disease Neg Hx   . Hyperlipidemia Neg Hx   . Hypertension Neg Hx   . Kidney disease Neg Hx   . Learning disabilities Neg Hx   . Mental illness Neg Hx   . Mental retardation Neg Hx   . Miscarriages / Stillbirths Neg Hx   . Stroke Neg Hx   . Vision loss Neg Hx   . Varicose Veins Neg Hx     Social History  Substance Use Topics  . Smoking status: Former Smoker -- 0.50 packs/day  . Smokeless tobacco: Never Used  . Alcohol Use: No    Allergies  Allergen Reactions  . Bactrim [Sulfamethoxazole-Trimethoprim] Hives  . Flagyl [Metronidazole] Hives  . Lubricants Hives    KY jelly causes itching and hives  . Penicillins Other (See Comments)    From record as child.  Has had Amoxicillin as an adult without a reaction.    Prescriptions  prior to admission  Medication Sig Dispense Refill Last Dose  . metoCLOPramide (REGLAN) 10 MG tablet Take 10 mg by mouth 4 (four) times daily.   11/22/2014 at Unknown time  . ondansetron (ZOFRAN ODT) 4 MG disintegrating tablet Take 1 tablet (4 mg total) by mouth every 8 (eight) hours as needed for nausea or vomiting. 30 tablet 1 11/22/2014 at Unknown time  . Prenatal Vit-Fe Fumarate-FA (PRENATAL MULTIVITAMIN) TABS tablet Take 1 tablet by mouth daily. 30 tablet 1 11/22/2014 at Unknown time    Review of Systems - Negative except for what is mentioned in HPI.  Physical Exam  Blood pressure 106/62, pulse 62, temperature 98.7 F (37.1 C), resp. rate 18, last menstrual period 06/03/2014, SpO2 100 %. GENERAL: Well-developed, well-nourished female in no acute distress.  LUNGS: Clear to auscultation bilaterally.  HEART: Regular rate and rhythm. ABDOMEN: Soft, nontender, nondistended, gravid.  BACK: no cva tenderness EXTREMITIES: Nontender, no edema, 2+ distal pulses. FHT:  145/mod/+a/occasional variable Contractions: none   Labs: Results for orders placed or performed during the hospital encounter of 11/22/14 (from the past 24 hour(s))  Comprehensive metabolic  panel   Collection Time: 11/22/14  1:50 PM  Result Value Ref Range   Sodium 133 (L) 135 - 145 mmol/L   Potassium 3.4 (L) 3.5 - 5.1 mmol/L   Chloride 101 101 - 111 mmol/L   CO2 20 (L) 22 - 32 mmol/L   Glucose, Bld 68 65 - 99 mg/dL   BUN 7 6 - 20 mg/dL   Creatinine, Ser 1.61 0.44 - 1.00 mg/dL   Calcium 8.7 (L) 8.9 - 10.3 mg/dL   Total Protein 6.7 6.5 - 8.1 g/dL   Albumin 3.2 (L) 3.5 - 5.0 g/dL   AST 15 15 - 41 U/L   ALT 13 (L) 14 - 54 U/L   Alkaline Phosphatase 38 38 - 126 U/L   Total Bilirubin 0.5 0.3 - 1.2 mg/dL   GFR calc non Af Amer >60 >60 mL/min   GFR calc Af Amer >60 >60 mL/min   Anion gap 12 5 - 15  CBC with Differential/Platelet   Collection Time: 11/22/14  1:50 PM  Result Value Ref Range   WBC 6.1 4.0 - 10.5 K/uL    RBC 3.50 (L) 3.87 - 5.11 MIL/uL   Hemoglobin 11.7 (L) 12.0 - 15.0 g/dL   HCT 09.6 (L) 04.5 - 40.9 %   MCV 97.7 78.0 - 100.0 fL   MCH 33.4 26.0 - 34.0 pg   MCHC 34.2 30.0 - 36.0 g/dL   RDW 81.1 91.4 - 78.2 %   Platelets 136 (L) 150 - 400 K/uL   Neutrophils Relative % 67 %   Neutro Abs 4.1 1.7 - 7.7 K/uL   Lymphocytes Relative 24 %   Lymphs Abs 1.5 0.7 - 4.0 K/uL   Monocytes Relative 8 %   Monocytes Absolute 0.5 0.1 - 1.0 K/uL   Eosinophils Relative 1 %   Eosinophils Absolute 0.0 0.0 - 0.7 K/uL   Basophils Relative 0 %   Basophils Absolute 0.0 0.0 - 0.1 K/uL  TSH   Collection Time: 11/22/14  1:50 PM  Result Value Ref Range   TSH 0.482 0.350 - 4.500 uIU/mL  Urinalysis, Routine w reflex microscopic (not at [facility])   Collection Time: 11/22/14  2:40 PM  Result Value Ref Range   Color, Urine YELLOW YELLOW   APPearance CLEAR CLEAR   Specific Gravity, Urine 1.025 1.005 - 1.030   pH 6.5 5.0 - 8.0   Glucose, UA NEGATIVE NEGATIVE mg/dL   Hgb urine dipstick NEGATIVE NEGATIVE   Bilirubin Urine NEGATIVE NEGATIVE   Ketones, ur >80 (A) NEGATIVE mg/dL   Protein, ur NEGATIVE NEGATIVE mg/dL   Urobilinogen, UA 0.2 0.0 - 1.0 mg/dL   Nitrite NEGATIVE NEGATIVE   Leukocytes, UA NEGATIVE NEGATIVE    Imaging Studies:  No results found.  Assessment: Brandy Ferguson is  29 y.o. G2P0001 at [redacted]w[redacted]d presents with nausea and vomiting in pregnancy. We gave 2 liters fluid, methylpred, and zofran, and patient passed po challenge w/o vomiting. Urine did show ketones; no AKI on CMP or hyperkalemia. CBC significant only for mild thrombocytopenia. NST reactive for gestational age but with a few small variables, likely 2/2 patient's dehydration. Discussed with Dr. Claiborne Billings, who is in agreement with the following plan:  Plan: - d/c home with dehydration return precautions and ob f/u early next week - push fluids - prednisone taper - zofran prn   Silvano Bilis 9/16/20165:03 PM

## 2014-11-22 NOTE — Discharge Instructions (Signed)
Hyperemesis Gravidarum °Hyperemesis gravidarum is a severe form of nausea and vomiting that happens during pregnancy. Hyperemesis is worse than morning sickness. It may cause you to have nausea or vomiting all day for many days. It may keep you from eating and drinking enough food and liquids. Hyperemesis usually occurs during the first half (the first 20 weeks) of pregnancy. It often goes away once a woman is in her second half of pregnancy. However, sometimes hyperemesis continues through an entire pregnancy.  °CAUSES  °The cause of this condition is not completely known but is thought to be related to changes in the body's hormones when pregnant. It could be from the high level of the pregnancy hormone or an increase in estrogen in the body.  °SIGNS AND SYMPTOMS  °· Severe nausea and vomiting. °· Nausea that does not go away. °· Vomiting that does not allow you to keep any food down. °· Weight loss and body fluid loss (dehydration). °· Having no desire to eat or not liking food you have previously enjoyed. °DIAGNOSIS  °Your health care provider will do a physical exam and ask you about your symptoms. He or she may also order blood tests and urine tests to make sure something else is not causing the problem.  °TREATMENT  °You may only need medicine to control the problem. If medicines do not control the nausea and vomiting, you will be treated in the hospital to prevent dehydration, increased acid in the blood (acidosis), weight loss, and changes in the electrolytes in your body that may harm the unborn baby (fetus). You may need IV fluids.  °HOME CARE INSTRUCTIONS  °· Only take over-the-counter or prescription medicines as directed by your health care provider. °· Try eating a couple of dry crackers or toast in the morning before getting out of bed. °· Avoid foods and smells that upset your stomach. °· Avoid fatty and spicy foods. °· Eat 5-6 small meals a day. °· Do not drink when eating meals. Drink between  meals. °· For snacks, eat high-protein foods, such as cheese. °· Eat or suck on things that have ginger in them. Ginger helps nausea. °· Avoid food preparation. The smell of food can spoil your appetite. °· Avoid iron pills and iron in your multivitamins until after 3-4 months of being pregnant. However, consult with your health care provider before stopping any prescribed iron pills. °SEEK MEDICAL CARE IF:  °· Your abdominal pain increases. °· You have a severe headache. °· You have vision problems. °· You are losing weight. °SEEK IMMEDIATE MEDICAL CARE IF:  °· You are unable to keep fluids down. °· You vomit blood. °· You have constant nausea and vomiting. °· You have excessive weakness. °· You have extreme thirst. °· You have dizziness or fainting. °· You have a fever or persistent symptoms for more than 2-3 days. °· You have a fever and your symptoms suddenly get worse. °MAKE SURE YOU:  °· Understand these instructions. °· Will watch your condition. °· Will get help right away if you are not doing well or get worse. °Document Released: 02/22/2005 Document Revised: 12/13/2012 Document Reviewed: 10/04/2012 °ExitCare® Patient Information ©2015 [employer]. This information is not intended to replace advice given to you by your health care provider. Make sure you discuss any questions you have with your health care provider. ° °

## 2014-11-22 NOTE — MAU Note (Signed)
Patient came to MAU from office.  Dr. Henderson Cloud sent patient for labs r/t dehydration.  Patient has been having episodes of n/v for past few days.  Has not been able to keep anything down since Wednesday.  Patient took zofran today.  Reports +fetal movement.  Denies vaginal bleeding and LOF.

## 2014-12-09 ENCOUNTER — Inpatient Hospital Stay (HOSPITAL_COMMUNITY)
Admission: AD | Admit: 2014-12-09 | Discharge: 2014-12-09 | Disposition: A | Discharge status: Home / Self Care | Payer: Medicaid Other | Source: Ambulatory Visit | Attending: Obstetrics and Gynecology | Admitting: Obstetrics and Gynecology

## 2014-12-09 ENCOUNTER — Encounter (HOSPITAL_COMMUNITY): Payer: Self-pay

## 2014-12-09 DIAGNOSIS — O26892 Other specified pregnancy related conditions, second trimester: Secondary | ICD-10-CM | POA: Insufficient documentation

## 2014-12-09 DIAGNOSIS — Z87891 Personal history of nicotine dependence: Secondary | ICD-10-CM | POA: Insufficient documentation

## 2014-12-09 DIAGNOSIS — Z3A27 27 weeks gestation of pregnancy: Secondary | ICD-10-CM | POA: Insufficient documentation

## 2014-12-09 DIAGNOSIS — O368120 Decreased fetal movements, second trimester, not applicable or unspecified: Secondary | ICD-10-CM | POA: Diagnosis not present

## 2014-12-09 LAB — URINALYSIS, ROUTINE W REFLEX MICROSCOPIC
Bilirubin Urine: NEGATIVE
Glucose, UA: NEGATIVE mg/dL
Hgb urine dipstick: NEGATIVE
Ketones, ur: >80 mg/dL — AB
Nitrite: NEGATIVE
Protein, ur: NEGATIVE mg/dL
Specific Gravity, Urine: 1.025 (ref 1.005–1.030)
Urobilinogen, UA: 0.2 mg/dL (ref 0.0–1.0)
pH: 7 (ref 5.0–8.0)

## 2014-12-09 LAB — URINE MICROSCOPIC-ADD ON

## 2014-12-09 NOTE — MAU Note (Signed)
Pt reports she has not felt the baby move since last pm (movement noted when FHT's obtained), also reports abd pain and pressure and nausea and vomiting today.

## 2014-12-09 NOTE — MAU Provider Note (Signed)
History   G2P0010 at 27 wks in with decreased fetal movements since last night. Denies ROM or vag bleeding.  CSN: [id number]  Arrival date and time: 12/09/14 4098   First Provider Initiated Contact with Patient 12/09/14 1921      No chief complaint on file.  HPI  OB History    Gravida Para Term Preterm AB TAB SAB Ectopic Multiple Living   2 1 0       1      Past Medical History  Diagnosis Date  . Anemia     Past Surgical History  Procedure Laterality Date  . Cesarean section    . Appendectomy    . Wisdom tooth extraction      Family History  Problem Relation Age of Onset  . Alcohol abuse Neg Hx   . Arthritis Neg Hx   . Asthma Neg Hx   . Birth defects Neg Hx   . Cancer Neg Hx   . COPD Neg Hx   . Depression Neg Hx   . Diabetes Neg Hx   . Drug abuse Neg Hx   . Early death Neg Hx   . Hearing loss Neg Hx   . Heart disease Neg Hx   . Hyperlipidemia Neg Hx   . Hypertension Neg Hx   . Kidney disease Neg Hx   . Learning disabilities Neg Hx   . Mental illness Neg Hx   . Mental retardation Neg Hx   . Miscarriages / Stillbirths Neg Hx   . Stroke Neg Hx   . Vision loss Neg Hx   . Varicose Veins Neg Hx     Social History  Substance Use Topics  . Smoking status: Former Smoker -- 0.50 packs/day  . Smokeless tobacco: Never Used  . Alcohol Use: No    Allergies:  Allergies  Allergen Reactions  . Bactrim [Sulfamethoxazole-Trimethoprim] Hives  . Flagyl [Metronidazole] Hives  . Lubricants Hives    KY jelly causes itching and hives  . Penicillins Other (See Comments)    From record as child.  Has had Amoxicillin as an adult without a reaction.    Prescriptions prior to admission  Medication Sig Dispense Refill Last Dose  . metoCLOPramide (REGLAN) 10 MG tablet Take 10 mg by mouth 4 (four) times daily.   11/22/2014 at Unknown time  . ondansetron (ZOFRAN ODT) 4 MG disintegrating tablet Take 1 tablet (4 mg total) by mouth every 8 (eight) hours as needed for nausea  or vomiting. 30 tablet 1 11/22/2014 at Unknown time  . ondansetron (ZOFRAN ODT) 4 MG disintegrating tablet Take 1 tablet (4 mg total) by mouth every 6 (six) hours as needed for nausea. 30 tablet 2   . predniSONE (DELTASONE) 5 MG tablet Day 1: 40 mg (8 tabs) by mouth Days 2-4: 20 mg (4 tabs) Days 5-7: 10 mg (2 tabs) Days 8-14: 5 mg (1 tab) 33 tablet 0   . Prenatal Vit-Fe Fumarate-FA (PRENATAL MULTIVITAMIN) TABS tablet Take 1 tablet by mouth daily. 30 tablet 1 11/22/2014 at Unknown time    Review of Systems  Constitutional: Negative.   HENT: Negative.   Eyes: Negative.   Respiratory: Negative.   Cardiovascular: Negative.   Gastrointestinal: Negative.   Genitourinary: Negative.   Musculoskeletal: Negative.   Skin: Negative.   Neurological: Negative.   Endo/Heme/Allergies: Negative.   Psychiatric/Behavioral: Negative.    Physical Exam   Blood pressure 109/74, pulse 73, temperature 98.5 F (36.9 C), temperature source Oral, resp. rate 16, height   (1.6 m), weight 150 lb (68.04 kg), last menstrual period 06/03/2014, SpO2 100 %.  Physical Exam  Constitutional: She is oriented to person, place, and time. She appears well-developed and well-nourished.  HENT:  Head: Normocephalic.  Eyes: Pupils are equal, round, and reactive to light.  Cardiovascular: Normal rate, regular rhythm, normal heart sounds and intact distal pulses.   Respiratory: Effort normal and breath sounds normal.  GI: Soft. Bowel sounds are normal.  Genitourinary:  gravid  Musculoskeletal: Normal range of motion.  Neurological: She is alert and oriented to person, place, and time. She has normal reflexes.  Skin: Skin is warm and dry.  Psychiatric: She has a normal mood and affect. Her behavior is normal. Judgment and thought content normal.    MAU Course  Procedures  MDM Active fetus  FHR reassurring  Assessment and Plan  Fetus very active on admit to unit. FHR pattern reassurring. Will d/c home with  reassurring strip.  Brandy Ferguson Brandy Ferguson 12/09/2014, 7:23 PM

## 2014-12-09 NOTE — Discharge Instructions (Signed)

## 2014-12-12 LAB — OB RESULTS CONSOLE RPR: RPR: NONREACTIVE

## 2015-01-09 ENCOUNTER — Other Ambulatory Visit: Payer: Self-pay | Admitting: Obstetrics and Gynecology

## 2015-01-14 ENCOUNTER — Other Ambulatory Visit: Payer: Self-pay | Admitting: Obstetrics and Gynecology

## 2015-01-18 ENCOUNTER — Inpatient Hospital Stay (HOSPITAL_COMMUNITY)
Admission: AD | Admit: 2015-01-18 | Discharge: 2015-01-18 | Disposition: A | Discharge status: Home / Self Care | Payer: Medicaid Other | Source: Ambulatory Visit | Attending: Obstetrics and Gynecology | Admitting: Obstetrics and Gynecology

## 2015-01-18 ENCOUNTER — Encounter (HOSPITAL_COMMUNITY): Payer: Self-pay

## 2015-01-18 DIAGNOSIS — R0602 Shortness of breath: Secondary | ICD-10-CM | POA: Diagnosis not present

## 2015-01-18 DIAGNOSIS — O9989 Other specified diseases and conditions complicating pregnancy, childbirth and the puerperium: Secondary | ICD-10-CM | POA: Diagnosis not present

## 2015-01-18 DIAGNOSIS — Z3A32 32 weeks gestation of pregnancy: Secondary | ICD-10-CM | POA: Insufficient documentation

## 2015-01-18 DIAGNOSIS — Z880 Allergy status to penicillin: Secondary | ICD-10-CM | POA: Diagnosis not present

## 2015-01-18 DIAGNOSIS — Z87891 Personal history of nicotine dependence: Secondary | ICD-10-CM | POA: Insufficient documentation

## 2015-01-18 DIAGNOSIS — O26893 Other specified pregnancy related conditions, third trimester: Secondary | ICD-10-CM | POA: Diagnosis not present

## 2015-01-18 DIAGNOSIS — R109 Unspecified abdominal pain: Secondary | ICD-10-CM

## 2015-01-18 DIAGNOSIS — O26899 Other specified pregnancy related conditions, unspecified trimester: Secondary | ICD-10-CM

## 2015-01-18 DIAGNOSIS — R339 Retention of urine, unspecified: Secondary | ICD-10-CM | POA: Diagnosis not present

## 2015-01-18 LAB — URINALYSIS, ROUTINE W REFLEX MICROSCOPIC
Bilirubin Urine: NEGATIVE
GLUCOSE, UA: NEGATIVE mg/dL
KETONES UR: NEGATIVE mg/dL
LEUKOCYTES UA: NEGATIVE
NITRITE: NEGATIVE
PH: 6.5 (ref 5.0–8.0)
Protein, ur: NEGATIVE mg/dL
SPECIFIC GRAVITY, URINE: 1.015 (ref 1.005–1.030)
Urobilinogen, UA: 1 mg/dL (ref 0.0–1.0)

## 2015-01-18 LAB — URINE MICROSCOPIC-ADD ON

## 2015-01-18 LAB — RAPID URINE DRUG SCREEN, HOSP PERFORMED
AMPHETAMINES: NOT DETECTED
BARBITURATES: NOT DETECTED
BENZODIAZEPINES: NOT DETECTED
COCAINE: NOT DETECTED
Opiates: NOT DETECTED
Tetrahydrocannabinol: POSITIVE — AB

## 2015-01-18 MED ORDER — GI COCKTAIL ~~LOC~~
30.0000 mL | Freq: Once | ORAL | Status: AC
  Administered 2015-01-18: 30 mL via ORAL
  Filled 2015-01-18: qty 30

## 2015-01-18 NOTE — MAU Note (Signed)
Patient present with abdominal pain starting around 1500 today and SOB that started at 1700 yesterday. She also has c/o urinary retention for the past 3 days. Patient states that she was incarcerated and that's why she is just coming in. Denies vagina bleeding or discharge. Fetus active.

## 2015-01-18 NOTE — MAU Provider Note (Signed)
History     CSN: [id number]  Arrival date and time: 01/18/15 1751   None     Chief Complaint  Patient presents with  . Shortness of Breath  . Abdominal Pain  . Urinary Retention   HPIpt is [redacted]w[redacted]d G2P0001   Hx of preterm labor at 26 weeks with previous pregnancy but delivered at term by C/Section/ Pt presents with lower abd pain that comes and goes.  Pt has had urinary retention unable to void-pt states she has had urge to go but has not been able to void for about 3 days- no pain with urination Pt is on Nexium BID since Nov 3, last appointment with [facility] and on zofran- denies any other medications Pt has been incarcerated in jail  since Monday and got out yesterday at 2:30  Pt also c/o SOB since last night.   Pt had hard bowel movement yesterday Last IC 2 to 3 weeks ago Pt denies spotting or bleeding or LOF or vaginal discharge RN Note: Patient present with abdominal pain starting around 1500 today and SOB that started at 1700 yesterday. She also has c/o urinary retention for the past 3 days. Patient states that she was incarcerated and that's why she is just coming in. Denies vagina bleeding or discharge. Fetus active.            Past Medical History  Diagnosis Date  . Anemia     Past Surgical History  Procedure Laterality Date  . Cesarean section    . Appendectomy    . Wisdom tooth extraction      Family History  Problem Relation Age of Onset  . Alcohol abuse Neg Hx   . Arthritis Neg Hx   . Asthma Neg Hx   . Birth defects Neg Hx   . Cancer Neg Hx   . COPD Neg Hx   . Depression Neg Hx   . Diabetes Neg Hx   . Drug abuse Neg Hx   . Early death Neg Hx   . Hearing loss Neg Hx   . Heart disease Neg Hx   . Hyperlipidemia Neg Hx   . Hypertension Neg Hx   . Kidney disease Neg Hx   . Learning disabilities Neg Hx   . Mental illness Neg Hx   . Mental retardation Neg Hx   . Miscarriages / Stillbirths Neg Hx   . Stroke Neg Hx   . Vision loss Neg Hx   .  Varicose Veins Neg Hx     Social History  Substance Use Topics  . Smoking status: Former Smoker -- 0.50 packs/day  . Smokeless tobacco: Never Used  . Alcohol Use: No    Allergies:  Allergies  Allergen Reactions  . Bactrim [Sulfamethoxazole-Trimethoprim] Hives  . Flagyl [Metronidazole] Hives  . Lubricants Hives    KY jelly causes itching and hives  . Penicillins Other (See Comments)    From record as child.  Has had Amoxicillin as an adult without a reaction.    Prescriptions prior to admission  Medication Sig Dispense Refill Last Dose  . Ca Carbonate-Mag Hydroxide (ROLAIDS PO) Take 1 tablet by mouth 4 (four) times daily as needed (heartburn, indigestion).   Past Week at Unknown time  . metoCLOPramide (REGLAN) 10 MG tablet Take 10 mg by mouth 4 (four) times daily.   Past Month at Unknown time  . ondansetron (ZOFRAN ODT) 4 MG disintegrating tablet Take 1 tablet (4 mg total) by mouth every 8 (eight) hours as  needed for nausea or vomiting. (Patient not taking: Reported on 12/09/2014) 30 tablet 1 Not Taking at Unknown time  . ondansetron (ZOFRAN ODT) 4 MG disintegrating tablet Take 1 tablet (4 mg total) by mouth every 6 (six) hours as needed for nausea. 30 tablet 2 12/09/2014 at Unknown time  . predniSONE (DELTASONE) 5 MG tablet Day 1: 40 mg (8 tabs) by mouth Days 2-4: 20 mg (4 tabs) Days 5-7: 10 mg (2 tabs) Days 8-14: 5 mg (1 tab) 33 tablet 0 Past Week at Unknown time  . Prenatal Vit-Fe Fumarate-FA (PRENATAL MULTIVITAMIN) TABS tablet Take 1 tablet by mouth daily. (Patient not taking: Reported on 12/09/2014) 30 tablet 1 11/22/2014 at Unknown time    Review of Systems  Constitutional: Negative for fever and chills.  Respiratory: Positive for shortness of breath. Negative for cough.   Cardiovascular: Negative for chest pain.  Gastrointestinal: Positive for nausea, vomiting, abdominal pain and constipation. Negative for diarrhea.  Genitourinary: Positive for urgency. Negative for dysuria.   Musculoskeletal: Negative for back pain.  Neurological: Negative for headaches.   Physical Exam   Blood pressure 114/75, pulse 108, temperature 97.7 F (36.5 C), temperature source Oral, resp. rate 17, last menstrual period 06/03/2014, SpO2 100 %.  Physical Exam  Nursing note and vitals reviewed. Constitutional: She is oriented to person, place, and time. She appears well-developed and well-nourished. No distress.  HENT:  Head: Normocephalic.  Eyes: Pupils are equal, round, and reactive to light.  Neck: Normal range of motion. Neck supple.  Cardiovascular: Normal rate, regular rhythm and normal heart sounds.   Respiratory: Effort normal and breath sounds normal. No respiratory distress. She has no wheezes. She has no rales.  GI: Soft. She exhibits no distension. There is no tenderness. There is no rebound and no guarding.  Genitourinary:  Cervix closed  Musculoskeletal: Normal range of motion.  Neurological: She is alert and oriented to person, place, and time.  Skin: Skin is warm and dry.  Psychiatric: She has a normal mood and affect.    MAU Course  Procedures GI cocktail given which relieved SOB bladder scan showed 225cc in bladder; in and out cath 64cc- pt's abdominal pain better after cath FHR reactive baseline 145 with accelerations 15x15- no ctx palpated  Or noted on toco Results for orders placed or performed during the hospital encounter of 01/18/15 (from the past 24 hour(s))  Urinalysis, Routine w reflex microscopic (not at [facility])     Status: Abnormal   Collection Time: 01/18/15  7:05 PM  Result Value Ref Range   Color, Urine YELLOW YELLOW   APPearance CLEAR CLEAR   Specific Gravity, Urine 1.015 1.005 - 1.030   pH 6.5 5.0 - 8.0   Glucose, UA NEGATIVE NEGATIVE mg/dL   Hgb urine dipstick TRACE (A) NEGATIVE   Bilirubin Urine NEGATIVE NEGATIVE   Ketones, ur NEGATIVE NEGATIVE mg/dL   Protein, ur NEGATIVE NEGATIVE mg/dL   Urobilinogen, UA 1.0 0.0 - 1.0 mg/dL    Nitrite NEGATIVE NEGATIVE   Leukocytes, UA NEGATIVE NEGATIVE  Urine rapid drug screen (hosp performed)     Status: Abnormal   Collection Time: 01/18/15  7:05 PM  Result Value Ref Range   Opiates NONE DETECTED NONE DETECTED   Cocaine NONE DETECTED NONE DETECTED   Benzodiazepines NONE DETECTED NONE DETECTED   Amphetamines NONE DETECTED NONE DETECTED   Tetrahydrocannabinol POSITIVE (A) NONE DETECTED   Barbiturates NONE DETECTED NONE DETECTED  Urine microscopic-add on     Status: Abnormal  Collection Time: 01/18/15  7:05 PM  Result Value Ref Range   Squamous Epithelial / LPF FEW (A) RARE   WBC, UA 0-2 <3 WBC/hpf   Bacteria, UA FEW (A) RARE  discussed with Dr. Tenny Craw Assessment and Plan  Abdominal pain in pregnancy- [redacted]w[redacted]d- kick counts given Urinary retention Reactive  NST Shortness of breath- resolved with GI cocktail F/u with OB appointment as scheduled- sooner if sx persist Yenny Kosa 01/18/2015, 6:17 PM

## 2015-01-20 LAB — URINE CULTURE: Culture: NO GROWTH

## 2015-01-21 ENCOUNTER — Encounter: Payer: 59 | Admitting: Certified Nurse Midwife

## 2015-02-07 ENCOUNTER — Other Ambulatory Visit: Payer: Self-pay | Admitting: Obstetrics and Gynecology

## 2015-02-11 ENCOUNTER — Other Ambulatory Visit: Payer: Self-pay | Admitting: Obstetrics and Gynecology

## 2015-02-26 ENCOUNTER — Encounter: Payer: Self-pay | Admitting: Physician Assistant

## 2015-02-26 ENCOUNTER — Ambulatory Visit (INDEPENDENT_AMBULATORY_CARE_PROVIDER_SITE_OTHER): Payer: Medicaid Other | Admitting: Physician Assistant

## 2015-02-26 VITALS — BP 118/68 | HR 72 | Ht 63.0 in | Wt 162.4 lb

## 2015-02-26 DIAGNOSIS — R0602 Shortness of breath: Secondary | ICD-10-CM | POA: Diagnosis not present

## 2015-02-26 NOTE — Progress Notes (Signed)
Cardiology Office Note   Date:  02/26/2015   ID:  Brandy Ferguson, DOB Aug 02, 1985, MRN [id number]  PCP:  No primary care provider on file.  Cardiologist:   New- seen by Dr Ladona Ridgel today  CC: SOB and Palpitations    History of Present Illness: Brandy Ferguson is a 29 y.o. pregnant female with no past medical history presents to clinic for evaluation of SOB and palpitations.   She is currently [redacted] weeks pregnant (C-section planned on 03/06/15). She has struggled with hyperemesis gravidarum and considered high risk due to previous almost pre term labor in previous pregnancy in 2006. She started noticing SOB and palpitations in November. She gets this randomly and it can last from anywhere  from 2-15 minutes. ~ 6 time a day. No CP or SOB. Not related to exertion. Similar episodes occurred during her previous pregnancy. 6 weeks after delivery she was seen by Cardiology at [facility], although I cannot find these notes. She does not remember who she saw. She reports them doing a tilt table test and treadmill exercise stress test. She does not know the results but was asked to take zoloft? But she refused. After the pregnancy she had complete resolution of her symptoms. She was in her usual state of health until this most recent pregnancy starting in November. Initially this was felt to be due to her hyperemesis gravidarum and pain, but now they ask for cardiology referral. She reports LE edema, orthpnea, PND. She did have an episode while in the exam room. I took a manuel pulse and her HR was ~80 bpm.     Past Medical History  Diagnosis Date  . Anemia   . DOE (dyspnea on exertion)   . Rapid heart beat     Past Surgical History  Procedure Laterality Date  . Cesarean section    . Appendectomy    . Wisdom tooth extraction       Current Outpatient Prescriptions  Medication Sig Dispense Refill  . acetaminophen (TYLENOL) 500 MG tablet Take 500 mg by mouth every 6 (six) hours as needed for mild pain.     Marland Kitchen ondansetron (ZOFRAN ODT) 4 MG disintegrating tablet Take 1 tablet (4 mg total) by mouth every 6 (six) hours as needed for nausea. 30 tablet 2  . Prenatal Vit-Fe Fumarate-FA (PRENATAL MULTIVITAMIN) TABS tablet Take 1 tablet by mouth daily. 30 tablet 1  . ranitidine (ZANTAC) 150 MG tablet Take 150 mg by mouth 2 (two) times daily.     No current facility-administered medications for this visit.    Allergies:   Bactrim; Flagyl; Lubricants; and Penicillins    Social History:  The patient  reports that she has quit smoking. She has never used smokeless tobacco. She reports that she does not drink alcohol or use illicit drugs.   Family History:  The patient's family history includes Hypertension in her mother. There is no history of Alcohol abuse, Arthritis, Asthma, Birth defects, Cancer, COPD, Depression, Diabetes, Drug abuse, Early death, Hearing loss, Heart disease, Hyperlipidemia, Kidney disease, Learning disabilities, Mental illness, Mental retardation, Miscarriages / Stillbirths, Stroke, Vision loss, or Varicose Veins.    ROS:  Please see the history of present illness.   Otherwise, review of systems are positive for none.   All other systems are reviewed and negative.    PHYSICAL EXAM: VS:  BP 118/68 mmHg  Pulse 72  Ht  (1.6 m)  Wt 162 lb 6.4 oz (73.664 kg)  BMI 28.78 kg/m2  LMP 06/03/2014 (Approximate) , BMI Body mass index is 28.78 kg/(m^2). GEN: Well nourished, well developed, in no acute distress HEENT: normal Neck: no JVD, carotid bruits, or masses Cardiac: RRR; no murmurs, rubs, or gallops,no edema  Respiratory:  clear to auscultation bilaterally, normal work of breathing GI: soft, nontender, nondistended, + BS MS: no deformity or atrophy Skin: warm and dry, no rash Neuro:  Strength and sensation are intact Psych: euthymic mood, full affect   EKG:  EKG is ordered today. The ekg ordered today demonstrates NSR HR 72   Recent Labs: 11/22/2014: ALT 13*; BUN 7;  Creatinine, Ser 0.47; Hemoglobin 11.7*; Platelets 136*; Potassium 3.4*; Sodium 133*; TSH 0.482    Lipid Panel No results found for: CHOL, TRIG, HDL, CHOLHDL, VLDL, LDLCALC, LDLDIRECT    Wt Readings from Last 3 Encounters:  02/26/15 162 lb 6.4 oz (73.664 kg)  12/09/14 150 lb (68.04 kg)  10/09/14 148 lb 3.2 oz (67.223 kg)      Other studies Reviewed: Additional studies/ records that were reviewed today include: none  Review of the above records demonstrates:    ASSESSMENT AND PLAN:  Brandy Ferguson is a 29 y.o. pregnant female with no past medical history presents to clinic for evaluation of SOB and palpitations.   Palpitations/SOB: most likely due to compression of the IVC and compensatory tachycardia and SOB. Reassuring that she had similar sx during previous pregnancy with complete resolution after delivery. We will obtain a 2D ECHO to evaluate structural function of the heart.    Current medicines are reviewed at length with the patient today.  The patient does not have concerns regarding medicines.  The following changes have been made:  no change  Labs/ tests ordered today include:   Orders Placed This Encounter  Procedures  . EKG 12-Lead  . Echocardiogram     Disposition:   FU with depending on ECHO results. If normal she can see us as needed in the future.  Charlestine MassedSigned, THOMPSON, KATHRYN R, PA-C  02/26/2015 2:47 PM    [facility] [address], [place], [place]  [address] Phone: [phone]; Fax: [phone]

## 2015-02-26 NOTE — Patient Instructions (Addendum)
Medication Instructions:  Your physician recommends that you continue on your current medications as directed. Please refer to the Current Medication list given to you today.   Labwork: None ordered  Testing/Procedures: Your physician has requested that you have an echocardiogram. Echocardiography is a painless test that uses sound waves to create images of your heart. It provides your doctor with information about the size and shape of your heart and how well your heart's chambers and valves are working. This procedure takes approximately one hour. There are no restrictions for this procedure.    Follow-Up: Your physician recommends that you schedule a follow up based upon your results from your test.     Any Other Special Instructions Will Be Listed Below (If Applicable).  Echocardiogram An echocardiogram, or echocardiography, uses sound waves (ultrasound) to produce an image of your heart. The echocardiogram is simple, painless, obtained within a short period of time, and offers valuable information to your health care provider. The images from an echocardiogram can provide information such as:  Evidence of coronary artery disease (CAD).  Heart size.  Heart muscle function.  Heart valve function.  Aneurysm detection.  Evidence of a past heart attack.  Fluid buildup around the heart.  Heart muscle thickening.  Assess heart valve function. LET [facility]ARE PROVIDER KNOW ABOUT:  Any allergies you have.  All medicines you are taking, including vitamins, herbs, eye drops, creams, and over-the-counter medicines.  Previous problems you or members of your family have had with the use of anesthetics.  Any blood disorders you have.  Previous surgeries you have had.  Medical conditions you have.  Possibility of pregnancy, if this applies. BEFORE THE PROCEDURE  No special preparation is needed. Eat and drink normally.  PROCEDURE   In order to produce an image of  your heart, gel will be applied to your chest and a wand-like tool (transducer) will be moved over your chest. The gel will help transmit the sound waves from the transducer. The sound waves will harmlessly bounce off your heart to allow the heart images to be captured in real-time motion. These images will then be recorded.  You may need an IV to receive a medicine that improves the quality of the pictures. AFTER THE PROCEDURE You may return to your normal schedule including diet, activities, and medicines, unless your health care provider tells you otherwise.   This information is not intended to replace advice given to you by your health care provider. Make sure you discuss any questions you have with your health care provider.   Document Released: 02/20/2000 Document Revised: 03/15/2014 Document Reviewed: 10/30/2012 Elsevier Interactive Patient Education [employer].     If you need a refill on your cardiac medications before your next appointment, please call your pharmacy.

## 2015-02-27 ENCOUNTER — Ambulatory Visit (HOSPITAL_COMMUNITY)
Admission: RE | Admit: 2015-02-27 | Discharge: 2015-02-27 | Disposition: A | Discharge status: Home / Self Care | Payer: Medicaid Other | Source: Ambulatory Visit | Attending: Physician Assistant | Admitting: Physician Assistant

## 2015-02-27 DIAGNOSIS — R0602 Shortness of breath: Secondary | ICD-10-CM | POA: Diagnosis not present

## 2015-02-27 DIAGNOSIS — R0600 Dyspnea, unspecified: Secondary | ICD-10-CM | POA: Diagnosis not present

## 2015-02-27 NOTE — Progress Notes (Signed)
  Echocardiogram 2D Echocardiogram has been performed.  Brandy Ferguson, Brandy Ferguson R 02/27/2015, 11:50 AM

## 2015-02-28 ENCOUNTER — Encounter (HOSPITAL_COMMUNITY): Payer: Self-pay

## 2015-02-28 ENCOUNTER — Telehealth: Payer: Self-pay

## 2015-02-28 NOTE — Telephone Encounter (Signed)
Informed patient of echo results as shown below.  Notes Recorded by Janetta HoraKathryn R Thompson, PA-C on 02/27/2015 at 4:47 PM Echo normal. No need to follow up with cardiology unless she continues to have problems after delivery.

## 2015-02-28 NOTE — Patient Instructions (Signed)
Your procedure is scheduled on:  Thursday, Dec. 29, 2016  [facility] at:  10:30 A.M.  Pick up the phone at the desk and dial [phone].  Call this number if you have problems the morning of surgery: [phone].  Remember: Do NOT eat food:  After midnight Wednesday Do NOT drink clear liquids after:  8:00 A.M. Day of surgery Take these medicines the morning of surgery with a SIP OF WATER:  Zantac  Do NOT wear jewelry (body piercing), metal hair clips/bobby pins, or nail polish. Do NOT wear lotions, powders, or perfumes.  You may wear deoderant. Do NOT shave for 48 hours prior to surgery. Do NOT bring valuables to the hospital.  Leave suitcase in car.  After surgery it may be brought to your room.  For patients admitted to the hospital, checkout time is 11:00 AM the day of discharge.

## 2015-03-04 ENCOUNTER — Encounter (HOSPITAL_COMMUNITY)
Admission: RE | Admit: 2015-03-04 | Discharge: 2015-03-04 | Disposition: A | Discharge status: Home / Self Care | Payer: Medicaid Other | Source: Ambulatory Visit | Attending: Obstetrics and Gynecology | Admitting: Obstetrics and Gynecology

## 2015-03-04 ENCOUNTER — Encounter (HOSPITAL_COMMUNITY): Payer: Self-pay

## 2015-03-04 DIAGNOSIS — Z01818 Encounter for other preprocedural examination: Secondary | ICD-10-CM | POA: Diagnosis present

## 2015-03-04 HISTORY — DX: Gastro-esophageal reflux disease without esophagitis: K21.9

## 2015-03-04 LAB — TYPE AND SCREEN
ABO/RH(D): O POS
Antibody Screen: NEGATIVE

## 2015-03-04 LAB — CBC
HCT: 32.6 % — ABNORMAL LOW (ref 36.0–46.0)
Hemoglobin: 10.9 g/dL — ABNORMAL LOW (ref 12.0–15.0)
MCH: 32.1 pg (ref 26.0–34.0)
MCHC: 33.4 g/dL (ref 30.0–36.0)
MCV: 95.9 fL (ref 78.0–100.0)
Platelets: 100 K/uL — ABNORMAL LOW (ref 150–400)
RBC: 3.4 MIL/uL — ABNORMAL LOW (ref 3.87–5.11)
RDW: 14.2 % (ref 11.5–15.5)
WBC: 5.5 K/uL (ref 4.0–10.5)

## 2015-03-04 LAB — ABO/RH: ABO/RH(D): O POS

## 2015-03-04 NOTE — Pre-Procedure Instructions (Signed)
Dr. Delrae AlfredM. Judd made aware of Platelets 100 at PAT visit today we will redraw CBC morning of surgery.

## 2015-03-05 LAB — RPR: RPR Ser Ql: NONREACTIVE

## 2015-03-06 ENCOUNTER — Inpatient Hospital Stay (HOSPITAL_COMMUNITY): Payer: Medicaid Other | Admitting: Anesthesiology

## 2015-03-06 ENCOUNTER — Inpatient Hospital Stay (HOSPITAL_COMMUNITY)
Admission: RE | Admit: 2015-03-06 | Discharge: 2015-03-08 | DRG: 766 | Disposition: A | Discharge status: Home / Self Care | Payer: Medicaid Other | Source: Ambulatory Visit | Attending: Obstetrics and Gynecology | Admitting: Obstetrics and Gynecology

## 2015-03-06 ENCOUNTER — Encounter (HOSPITAL_COMMUNITY): Payer: Self-pay | Admitting: Anesthesiology

## 2015-03-06 ENCOUNTER — Encounter (HOSPITAL_COMMUNITY)
Admission: RE | Disposition: A | Discharge status: Home / Self Care | Payer: Self-pay | Source: Ambulatory Visit | Attending: Obstetrics and Gynecology

## 2015-03-06 DIAGNOSIS — Z3A39 39 weeks gestation of pregnancy: Secondary | ICD-10-CM | POA: Diagnosis not present

## 2015-03-06 DIAGNOSIS — K219 Gastro-esophageal reflux disease without esophagitis: Secondary | ICD-10-CM | POA: Diagnosis present

## 2015-03-06 DIAGNOSIS — O9962 Diseases of the digestive system complicating childbirth: Secondary | ICD-10-CM | POA: Diagnosis present

## 2015-03-06 DIAGNOSIS — Z87891 Personal history of nicotine dependence: Secondary | ICD-10-CM | POA: Diagnosis not present

## 2015-03-06 DIAGNOSIS — O34211 Maternal care for low transverse scar from previous cesarean delivery: Principal | ICD-10-CM | POA: Diagnosis present

## 2015-03-06 DIAGNOSIS — Z9889 Other specified postprocedural states: Secondary | ICD-10-CM

## 2015-03-06 LAB — CBC
HEMATOCRIT: 34.6 % — AB (ref 36.0–46.0)
HEMOGLOBIN: 11.6 g/dL — AB (ref 12.0–15.0)
MCH: 32.1 pg (ref 26.0–34.0)
MCHC: 33.5 g/dL (ref 30.0–36.0)
MCV: 95.8 fL (ref 78.0–100.0)
Platelets: 121 10*3/uL — ABNORMAL LOW (ref 150–400)
RBC: 3.61 MIL/uL — ABNORMAL LOW (ref 3.87–5.11)
RDW: 14.1 % (ref 11.5–15.5)
WBC: 6 10*3/uL (ref 4.0–10.5)

## 2015-03-06 SURGERY — Surgical Case
Anesthesia: Spinal

## 2015-03-06 MED ORDER — OXYTOCIN 10 UNIT/ML IJ SOLN
INTRAMUSCULAR | Status: AC
  Filled 2015-03-06: qty 4

## 2015-03-06 MED ORDER — LACTATED RINGERS IV SOLN
40.0000 [IU] | INTRAVENOUS | Status: DC | PRN
  Administered 2015-03-06: 40 [IU] via INTRAVENOUS

## 2015-03-06 MED ORDER — METHYLERGONOVINE MALEATE 0.2 MG PO TABS: 0.2000 mg | ORAL_TABLET | ORAL | Status: DC | PRN

## 2015-03-06 MED ORDER — FERROUS SULFATE 325 (65 FE) MG PO TABS
325.0000 mg | ORAL_TABLET | Freq: Two times a day (BID) | ORAL | Status: DC
  Administered 2015-03-06 – 2015-03-08 (×4): 325 mg via ORAL
  Filled 2015-03-06 (×4): qty 1

## 2015-03-06 MED ORDER — DIBUCAINE 1 % RE OINT: 1.0000 "application " | TOPICAL_OINTMENT | RECTAL | Status: DC | PRN

## 2015-03-06 MED ORDER — DIPHENHYDRAMINE HCL 25 MG PO CAPS
25.0000 mg | ORAL_CAPSULE | Freq: Four times a day (QID) | ORAL | Status: DC | PRN
  Administered 2015-03-07 (×3): 25 mg via ORAL
  Filled 2015-03-06 (×4): qty 1

## 2015-03-06 MED ORDER — PROPOFOL 10 MG/ML IV BOLUS
INTRAVENOUS | Status: AC
  Filled 2015-03-06: qty 20

## 2015-03-06 MED ORDER — PROMETHAZINE HCL 25 MG/ML IJ SOLN: 6.2500 mg | INTRAMUSCULAR | Status: DC | PRN

## 2015-03-06 MED ORDER — ZOLPIDEM TARTRATE 5 MG PO TABS: 5.0000 mg | ORAL_TABLET | Freq: Every evening | ORAL | Status: DC | PRN

## 2015-03-06 MED ORDER — ACETAMINOPHEN 500 MG PO TABS
1000.0000 mg | ORAL_TABLET | Freq: Four times a day (QID) | ORAL | Status: AC
  Administered 2015-03-06: 500 mg via ORAL
  Filled 2015-03-06 (×3): qty 2

## 2015-03-06 MED ORDER — MORPHINE SULFATE (PF) 0.5 MG/ML IJ SOLN
INTRAMUSCULAR | Status: DC | PRN
Start: 2015-03-06 — End: 2015-03-06
  Administered 2015-03-06: .2 mg via INTRATHECAL

## 2015-03-06 MED ORDER — BISACODYL 10 MG RE SUPP
10.0000 mg | Freq: Every day | RECTAL | Status: DC | PRN
Start: 2015-03-06 — End: 2015-03-08
  Filled 2015-03-06: qty 1

## 2015-03-06 MED ORDER — PHENYLEPHRINE 8 MG IN D5W 100 ML (0.08MG/ML) PREMIX OPTIME
INJECTION | INTRAVENOUS | Status: DC | PRN
  Administered 2015-03-06: 60 ug/min via INTRAVENOUS

## 2015-03-06 MED ORDER — SODIUM CHLORIDE 0.9 % IR SOLN
Status: DC | PRN
  Administered 2015-03-06: 1000 mL

## 2015-03-06 MED ORDER — CEFAZOLIN SODIUM-DEXTROSE 2-3 GM-% IV SOLR
INTRAVENOUS | Status: AC
  Filled 2015-03-06: qty 50

## 2015-03-06 MED ORDER — CEFAZOLIN SODIUM-DEXTROSE 2-3 GM-% IV SOLR
2.0000 g | INTRAVENOUS | Status: AC
  Administered 2015-03-06: 2 g via INTRAVENOUS

## 2015-03-06 MED ORDER — PHENYLEPHRINE 8 MG IN D5W 100 ML (0.08MG/ML) PREMIX OPTIME
INJECTION | INTRAVENOUS | Status: AC
  Filled 2015-03-06: qty 100

## 2015-03-06 MED ORDER — LACTATED RINGERS IV SOLN
INTRAVENOUS | Status: DC
  Administered 2015-03-06 (×3): via INTRAVENOUS

## 2015-03-06 MED ORDER — MENTHOL 3 MG MT LOZG
1.0000 | LOZENGE | OROMUCOSAL | Status: DC | PRN
  Filled 2015-03-06: qty 9

## 2015-03-06 MED ORDER — HYDROMORPHONE HCL 1 MG/ML IJ SOLN: 0.2500 mg | INTRAMUSCULAR | Status: DC | PRN

## 2015-03-06 MED ORDER — SCOPOLAMINE 1 MG/3DAYS TD PT72
MEDICATED_PATCH | TRANSDERMAL | Status: AC
  Administered 2015-03-06: 1.5 mg via TRANSDERMAL
  Filled 2015-03-06: qty 1

## 2015-03-06 MED ORDER — SIMETHICONE 80 MG PO CHEW
80.0000 mg | CHEWABLE_TABLET | ORAL | Status: DC | PRN
  Filled 2015-03-06: qty 1

## 2015-03-06 MED ORDER — TETANUS-DIPHTH-ACELL PERTUSSIS 5-2.5-18.5 LF-MCG/0.5 IM SUSP
0.5000 mL | Freq: Once | INTRAMUSCULAR | Status: AC
  Administered 2015-03-08: 0.5 mL via INTRAMUSCULAR

## 2015-03-06 MED ORDER — WITCH HAZEL-GLYCERIN EX PADS: 1.0000 "application " | MEDICATED_PAD | CUTANEOUS | Status: DC | PRN

## 2015-03-06 MED ORDER — ACETAMINOPHEN 325 MG PO TABS: 650.0000 mg | ORAL_TABLET | ORAL | Status: DC | PRN

## 2015-03-06 MED ORDER — KETOROLAC TROMETHAMINE 30 MG/ML IJ SOLN: 30.0000 mg | Freq: Once | INTRAMUSCULAR | Status: DC | PRN

## 2015-03-06 MED ORDER — IBUPROFEN 600 MG PO TABS
600.0000 mg | ORAL_TABLET | Freq: Four times a day (QID) | ORAL | Status: DC | PRN
  Administered 2015-03-07 (×2): 600 mg via ORAL

## 2015-03-06 MED ORDER — INFLUENZA VAC SPLIT QUAD 0.5 ML IM SUSY
0.5000 mL | PREFILLED_SYRINGE | INTRAMUSCULAR | Status: AC
Start: 2015-03-07 — End: 2015-03-08
  Administered 2015-03-08: 0.5 mL via INTRAMUSCULAR

## 2015-03-06 MED ORDER — SIMETHICONE 80 MG PO CHEW
80.0000 mg | CHEWABLE_TABLET | Freq: Three times a day (TID) | ORAL | Status: DC
  Administered 2015-03-07 (×2): 80 mg via ORAL
  Filled 2015-03-06 (×8): qty 1

## 2015-03-06 MED ORDER — SIMETHICONE 80 MG PO CHEW
80.0000 mg | CHEWABLE_TABLET | ORAL | Status: DC
  Filled 2015-03-06 (×3): qty 1

## 2015-03-06 MED ORDER — OXYCODONE-ACETAMINOPHEN 5-325 MG PO TABS
2.0000 | ORAL_TABLET | ORAL | Status: DC | PRN
  Administered 2015-03-07 – 2015-03-08 (×5): 2 via ORAL
  Filled 2015-03-06 (×4): qty 2

## 2015-03-06 MED ORDER — SENNOSIDES-DOCUSATE SODIUM 8.6-50 MG PO TABS
2.0000 | ORAL_TABLET | ORAL | Status: DC
  Filled 2015-03-06 (×3): qty 2

## 2015-03-06 MED ORDER — MEPERIDINE HCL 25 MG/ML IJ SOLN: 6.2500 mg | INTRAMUSCULAR | Status: DC | PRN

## 2015-03-06 MED ORDER — FLEET ENEMA 7-19 GM/118ML RE ENEM: 1.0000 | ENEMA | Freq: Every day | RECTAL | Status: DC | PRN

## 2015-03-06 MED ORDER — IBUPROFEN 600 MG PO TABS
600.0000 mg | ORAL_TABLET | Freq: Four times a day (QID) | ORAL | Status: DC
  Administered 2015-03-06 – 2015-03-08 (×4): 600 mg via ORAL
  Filled 2015-03-06 (×7): qty 1

## 2015-03-06 MED ORDER — SCOPOLAMINE 1 MG/3DAYS TD PT72: 1.0000 | MEDICATED_PATCH | Freq: Once | TRANSDERMAL | Status: DC

## 2015-03-06 MED ORDER — PRENATAL MULTIVITAMIN CH
1.0000 | ORAL_TABLET | Freq: Every day | ORAL | Status: DC
  Filled 2015-03-06: qty 1

## 2015-03-06 MED ORDER — ONDANSETRON HCL 4 MG/2ML IJ SOLN
INTRAMUSCULAR | Status: DC | PRN
  Administered 2015-03-06: 4 mg via INTRAVENOUS

## 2015-03-06 MED ORDER — OXYTOCIN 40 UNITS IN LACTATED RINGERS INFUSION - SIMPLE MED: 62.5000 mL/h | INTRAVENOUS | Status: AC

## 2015-03-06 MED ORDER — FENTANYL CITRATE (PF) 100 MCG/2ML IJ SOLN
INTRAMUSCULAR | Status: DC | PRN
  Administered 2015-03-06: 10 ug via INTRATHECAL

## 2015-03-06 MED ORDER — LACTATED RINGERS IV SOLN
INTRAVENOUS | Status: DC | PRN
  Administered 2015-03-06: 12:00:00 via INTRAVENOUS

## 2015-03-06 MED ORDER — METHYLERGONOVINE MALEATE 0.2 MG/ML IJ SOLN: 0.2000 mg | INTRAMUSCULAR | Status: DC | PRN

## 2015-03-06 MED ORDER — KETOROLAC TROMETHAMINE 30 MG/ML IJ SOLN: 30.0000 mg | Freq: Four times a day (QID) | INTRAMUSCULAR | Status: DC | PRN

## 2015-03-06 MED ORDER — ONDANSETRON HCL 4 MG/2ML IJ SOLN: 4.0000 mg | Freq: Three times a day (TID) | INTRAMUSCULAR | Status: DC | PRN

## 2015-03-06 MED ORDER — FENTANYL CITRATE (PF) 100 MCG/2ML IJ SOLN
INTRAMUSCULAR | Status: AC
  Filled 2015-03-06: qty 2

## 2015-03-06 MED ORDER — MEASLES, MUMPS & RUBELLA VAC ~~LOC~~ INJ
0.5000 mL | INJECTION | Freq: Once | SUBCUTANEOUS | Status: DC
  Filled 2015-03-06: qty 0.5

## 2015-03-06 MED ORDER — LANOLIN HYDROUS EX OINT: 1.0000 "application " | TOPICAL_OINTMENT | CUTANEOUS | Status: DC | PRN

## 2015-03-06 MED ORDER — BUPIVACAINE IN DEXTROSE 0.75-8.25 % IT SOLN
INTRATHECAL | Status: DC | PRN
  Administered 2015-03-06: 1.4 mL via INTRATHECAL

## 2015-03-06 MED ORDER — SCOPOLAMINE 1 MG/3DAYS TD PT72
1.0000 | MEDICATED_PATCH | Freq: Once | TRANSDERMAL | Status: DC
  Administered 2015-03-06: 1.5 mg via TRANSDERMAL

## 2015-03-06 MED ORDER — NALOXONE HCL 2 MG/2ML IJ SOSY
1.0000 ug/kg/h | PREFILLED_SYRINGE | INTRAMUSCULAR | Status: DC | PRN
Start: 2015-03-06 — End: 2015-03-08
  Filled 2015-03-06: qty 2

## 2015-03-06 MED ORDER — FAMOTIDINE 20 MG PO TABS
20.0000 mg | ORAL_TABLET | Freq: Two times a day (BID) | ORAL | Status: DC
  Administered 2015-03-06 – 2015-03-07 (×2): 20 mg via ORAL
  Filled 2015-03-06 (×9): qty 1

## 2015-03-06 MED ORDER — MORPHINE SULFATE (PF) 0.5 MG/ML IJ SOLN
INTRAMUSCULAR | Status: AC
  Filled 2015-03-06: qty 10

## 2015-03-06 MED ORDER — OXYCODONE-ACETAMINOPHEN 5-325 MG PO TABS
1.0000 | ORAL_TABLET | ORAL | Status: DC | PRN
  Administered 2015-03-07 (×2): 1 via ORAL
  Filled 2015-03-06 (×2): qty 1

## 2015-03-06 MED ORDER — LACTATED RINGERS IV SOLN
Freq: Once | INTRAVENOUS | Status: AC
  Administered 2015-03-06: 10:00:00 via INTRAVENOUS

## 2015-03-06 MED ORDER — LACTATED RINGERS IV SOLN
INTRAVENOUS | Status: DC
  Administered 2015-03-06: 21:00:00 via INTRAVENOUS

## 2015-03-06 SURGICAL SUPPLY — 34 items
BENZOIN TINCTURE PRP APPL 2/3 (GAUZE/BANDAGES/DRESSINGS) ×3 IMPLANT
CLAMP CORD UMBIL (MISCELLANEOUS) IMPLANT
CLOSURE STERI STRIP 1/2 X4 (GAUZE/BANDAGES/DRESSINGS) ×2 IMPLANT
CLOSURE WOUND 1/2 X4 (GAUZE/BANDAGES/DRESSINGS) ×1
CLOTH BEACON ORANGE TIMEOUT ST (SAFETY) ×3 IMPLANT
DRAPE SHEET LG 3/4 BI-LAMINATE (DRAPES) IMPLANT
DRSG OPSITE POSTOP 4X10 (GAUZE/BANDAGES/DRESSINGS) ×3 IMPLANT
DURAPREP 26ML APPLICATOR (WOUND CARE) ×3 IMPLANT
ELECT REM PT RETURN 9FT ADLT (ELECTROSURGICAL) ×3
ELECTRODE REM PT RTRN 9FT ADLT (ELECTROSURGICAL) ×1 IMPLANT
EXTRACTOR VACUUM BELL STYLE (SUCTIONS) IMPLANT
GLOVE BIO SURGEON STRL SZ7 (GLOVE) ×3 IMPLANT
GLOVE BIOGEL PI IND STRL 7.0 (GLOVE) ×1 IMPLANT
GLOVE BIOGEL PI INDICATOR 7.0 (GLOVE) ×2
GOWN STRL REUS W/TWL LRG LVL3 (GOWN DISPOSABLE) ×6 IMPLANT
KIT ABG SYR 3ML LUER SLIP (SYRINGE) IMPLANT
NEEDLE HYPO 25X5/8 SAFETYGLIDE (NEEDLE) IMPLANT
NS IRRIG 1000ML POUR BTL (IV SOLUTION) ×3 IMPLANT
PACK C SECTION WH (CUSTOM PROCEDURE TRAY) ×3 IMPLANT
PAD OB MATERNITY 4.3X12.25 (PERSONAL CARE ITEMS) ×3 IMPLANT
PENCIL SMOKE EVAC W/HOLSTER (ELECTROSURGICAL) ×3 IMPLANT
RTRCTR C-SECT PINK 25CM LRG (MISCELLANEOUS) ×3 IMPLANT
STRIP CLOSURE SKIN 1/2X4 (GAUZE/BANDAGES/DRESSINGS) ×2 IMPLANT
SUT MNCRL 0 VIOLET CTX 36 (SUTURE) ×2 IMPLANT
SUT MONOCRYL 0 CTX 36 (SUTURE) ×4
SUT PDS AB 0 CTX 60 (SUTURE) IMPLANT
SUT PLAIN 2 0 XLH (SUTURE) ×3 IMPLANT
SUT VIC AB 0 CT1 27 (SUTURE) ×4
SUT VIC AB 0 CT1 27XBRD ANBCTR (SUTURE) ×2 IMPLANT
SUT VIC AB 2-0 CT1 27 (SUTURE) ×2
SUT VIC AB 2-0 CT1 TAPERPNT 27 (SUTURE) ×1 IMPLANT
SUT VIC AB 4-0 KS 27 (SUTURE) ×3 IMPLANT
TOWEL OR 17X24 6PK STRL BLUE (TOWEL DISPOSABLE) ×3 IMPLANT
TRAY FOLEY CATH SILVER 14FR (SET/KITS/TRAYS/PACK) ×3 IMPLANT

## 2015-03-06 NOTE — Op Note (Signed)
03/06/2015  12:24 PM  PATIENT:  Brandy Ferguson  29 y.o. female  PRE-OPERATIVE DIAGNOSIS:  repeat cesarean section  Non reactive HIV  POST-OPERATIVE DIAGNOSIS:  repeat cesarean section  Non reactive HIV  PROCEDURE:  Procedure(s) with comments: CESAREAN SECTION (N/A) - EDD: 03/10/15  SURGEON:  Surgeon(s) and Role:    * Carrington ClampMichelle Mechille Varghese, MD - Primary   ANESTHESIA:   spinal  EBL:  Total I/O In: 3600 [I.V.:3600] Out: 1700 [Urine:500; Blood:1200]  SPECIMEN:  No Specimen  DISPOSITION OF SPECIMEN:  N/A  COUNTS:  YES  TOURNIQUET:  * No tourniquets in log *  DICTATION: .Note written in EPIC  PLAN OF CARE: Admit to inpatient   PATIENT DISPOSITION:  PACU - hemodynamically stable.   Delay start of Pharmacological VTE agent (>24hrs) due to surgical blood loss or risk of bleeding: not applicable  Complications:  none Medications:  Ancef, Pitocin Findings:  Baby female, Apgars 9,9, weight P.   Normal tubes, ovaries and uterus seen.  Baby was skin to skin with mother after birth in the OR.  Technique:  After adequate spinal anesthesia was achieved, the patient was prepped and draped in usual sterile fashion.  A foley catheter was used to drain the bladder.  A pfannanstiel incision was made with the scalpel and carried down to the fascia with the bovie cautery. The fascia was incised in the midline with the scalpel and carried in a transverse curvilinear manner bilaterally.  The fascia was reflected superiorly and inferiorly off the rectus muscles and the muscles split in the midline.  A bowel free portion of the peritoneum was entered bluntly and then extended in a superior and inferior manner with good visualization of the bowel and bladder.  The Alexis instrument was then placed and the vesico-uterine fascia tented up and incised in a transverse curvilinear manner.  A 2 cm transverse incision was made in the upper portion of the lower uterine segment until the amnion was exposed.   The  incision was extended transversely in a blunt manner.  Clear fluid was noted and the baby delivered in the vertex presentation without complication.  The baby was bulb suctioned and the cord was clamped and cut.  The baby was then handed to awaiting Neonatology.  The placenta was then delivered manually and the uterus cleared of all debris.  The uterine incision was then closed with a running lock stitch of 0 monocryl.  An imbricating layer of 0 monocryl was closed as well. Excellent hemostasis of the uterine incision was achieved and the abdomen was cleared with irrigation.  The peritoneum was closed with a running stitch of 2-0 vicryl.  This incorporated the rectus muscles as a separate layer.  The fascia was then closed with a running stitch of 0 vicryl.  The subcutaneous layer was closed with interrupted  stitches of 2-0 plain gut.  The skin was closed with 4-0 vicryl on a Keith needle and steri-strips.  The patient tolerated the procedure well and was returned to the recovery room in stable condition.  All counts were correct times three.  Brandy Ferguson A

## 2015-03-06 NOTE — Anesthesia Procedure Notes (Signed)
Spinal Patient location during procedure: OR Start time: 03/06/2015 11:36 AM End time: 03/06/2015 11:39 AM Staffing Anesthesiologist: Leilani AbleHATCHETT, Terree Gaultney Performed by: anesthesiologist  Preanesthetic Checklist Completed: patient identified, surgical consent, pre-op evaluation, timeout performed, IV checked, risks and benefits discussed and monitors and equipment checked Spinal Block Patient position: sitting Prep: DuraPrep Patient monitoring: heart rate, cardiac monitor, continuous pulse ox and blood pressure Approach: midline Location: L3-4 Injection technique: single-shot Needle Needle type: Pencan  Needle gauge: 24 G Needle length: 9 cm Needle insertion depth: 5 cm Assessment Sensory level: T4

## 2015-03-06 NOTE — Brief Op Note (Signed)
03/06/2015  12:24 PM  PATIENT:  Brandy Ferguson  29 y.o. female  PRE-OPERATIVE DIAGNOSIS:  repeat cesarean section  Non reactive HIV  POST-OPERATIVE DIAGNOSIS:  repeat cesarean section  Non reactive HIV  PROCEDURE:  Procedure(s) with comments: CESAREAN SECTION (N/A) - EDD: 03/10/15  SURGEON:  Surgeon(s) and Role:    * Munir Victorian, MD - Primary   ANESTHESIA:   spinal  EBL:  Total I/O In: 3600 [I.V.:3600] Out: 1700 [Urine:500; Blood:1200]  SPECIMEN:  No Specimen  DISPOSITION OF SPECIMEN:  N/A  COUNTS:  YES  TOURNIQUET:  * No tourniquets in log *  DICTATION: .Note written in EPIC  PLAN OF CARE: Admit to inpatient   PATIENT DISPOSITION:  PACU - hemodynamically stable.   Delay start of Pharmacological VTE agent (>24hrs) due to surgical blood loss or risk of bleeding: not applicable  Complications:  none Medications:  Ancef, Pitocin Findings:  Baby female, Apgars 9,9, weight P.   Normal tubes, ovaries and uterus seen.  Baby was skin to skin with mother after birth in the OR.  Technique:  After adequate spinal anesthesia was achieved, the patient was prepped and draped in usual sterile fashion.  A foley catheter was used to drain the bladder.  A pfannanstiel incision was made with the scalpel and carried down to the fascia with the bovie cautery. The fascia was incised in the midline with the scalpel and carried in a transverse curvilinear manner bilaterally.  The fascia was reflected superiorly and inferiorly off the rectus muscles and the muscles split in the midline.  A bowel free portion of the peritoneum was entered bluntly and then extended in a superior and inferior manner with good visualization of the bowel and bladder.  The Alexis instrument was then placed and the vesico-uterine fascia tented up and incised in a transverse curvilinear manner.  A 2 cm transverse incision was made in the upper portion of the lower uterine segment until the amnion was exposed.   The  incision was extended transversely in a blunt manner.  Clear fluid was noted and the baby delivered in the vertex presentation without complication.  The baby was bulb suctioned and the cord was clamped and cut.  The baby was then handed to awaiting Neonatology.  The placenta was then delivered manually and the uterus cleared of all debris.  The uterine incision was then closed with a running lock stitch of 0 monocryl.  An imbricating layer of 0 monocryl was closed as well. Excellent hemostasis of the uterine incision was achieved and the abdomen was cleared with irrigation.  The peritoneum was closed with a running stitch of 2-0 vicryl.  This incorporated the rectus muscles as a separate layer.  The fascia was then closed with a running stitch of 0 vicryl.  The subcutaneous layer was closed with interrupted  stitches of 2-0 plain gut.  The skin was closed with 4-0 vicryl on a Keith needle and steri-strips.  The patient tolerated the procedure well and was returned to the recovery room in stable condition.  All counts were correct times three.  Saba Gomm A    

## 2015-03-06 NOTE — Anesthesia Preprocedure Evaluation (Signed)
Anesthesia Evaluation  Patient identified by MRN, date of birth, ID band Patient awake    Reviewed: Allergy & Precautions, H&P , NPO status , Patient's Chart, lab work & pertinent test results  Airway Mallampati: I  TM Distance: >3 FB Neck ROM: full    Dental no notable dental hx.    Pulmonary former smoker,    Pulmonary exam normal        Cardiovascular negative cardio ROS Normal cardiovascular exam     Neuro/Psych negative neurological ROS  negative psych ROS   GI/Hepatic Neg liver ROS,   Endo/Other  negative endocrine ROS  Renal/GU negative Renal ROS     Musculoskeletal   Abdominal Normal abdominal exam  (+)   Peds  Hematology   Anesthesia Other Findings   Reproductive/Obstetrics (+) Pregnancy                             Anesthesia Physical Anesthesia Plan  ASA: II  Anesthesia Plan: Spinal   Post-op Pain Management:    Induction:   Airway Management Planned:   Additional Equipment:   Intra-op Plan:   Post-operative Plan:   Informed Consent: I have reviewed the patients History and Physical, chart, labs and discussed the procedure including the risks, benefits and alternatives for the proposed anesthesia with the patient or authorized representative who has indicated his/her understanding and acceptance.     Plan Discussed with: CRNA and Surgeon  Anesthesia Plan Comments:         Anesthesia Quick Evaluation

## 2015-03-06 NOTE — Transfer of Care (Signed)
Immediate Anesthesia Transfer of Care Note  Patient: Brandy Ferguson  Procedure(s) Performed: Procedure(s) with comments: CESAREAN SECTION (N/A) - EDD: 03/10/15  Patient Location: PACU  Anesthesia Type:Spinal  Level of Consciousness: awake, alert  and oriented  Airway & Oxygen Therapy: Patient Spontanous Breathing  Post-op Assessment: Report given to RN and Post -op Vital signs reviewed and stable  Post vital signs: Reviewed and stable  Last Vitals:  Filed Vitals:   03/06/15 1003  BP: 124/89  Pulse: 74  Temp: 36.3 C  Resp: 20    Complications: No apparent anesthesia complications

## 2015-03-06 NOTE — Anesthesia Postprocedure Evaluation (Signed)
Anesthesia Post Note  Patient: Brandy Ferguson  Procedure(s) Performed: Procedure(s) (LRB): CESAREAN SECTION (N/A)  Patient location during evaluation: PACU Anesthesia Type: Spinal Level of consciousness: awake Pain management: pain level controlled Vital Signs Assessment: post-procedure vital signs reviewed and stable Respiratory status: spontaneous breathing Cardiovascular status: stable Postop Assessment: no headache, no backache, spinal receding, patient able to bend at knees and no signs of nausea or vomiting Anesthetic complications: no    Last Vitals:  Filed Vitals:   03/06/15 1300 03/06/15 1315  BP: 100/81 108/85  Pulse: 67 65  Temp: 36.5 C   Resp: 18 17    Last Pain: There were no vitals filed for this visit.               Deairra Halleck JR,JOHN Susann GivensFRANKLIN

## 2015-03-06 NOTE — Progress Notes (Signed)
Unable to obtain oral temp Bair Hugger applied

## 2015-03-06 NOTE — H&P (Signed)
29 y.o.  7977w3d    G2P0001 comes in for a repeat cesarean section at term.  Patient has good fetal movement and no bleeding.    Past Medical History  Diagnosis Date  . Anemia   . DOE (dyspnea on exertion)   . Rapid heart beat   . GERD (gastroesophageal reflux disease)     with pregnancy    Past Surgical History  Procedure Laterality Date  . Cesarean section    . Appendectomy    . Wisdom tooth extraction      OB History  Gravida Para Term Preterm AB SAB TAB Ectopic Multiple Living  2 1 0       1    # Outcome Date GA Lbr Len/2nd Weight Sex Delivery Anes PTL Lv  2 Current           1 Para 05/11/04 5876w1d  3.544 kg (7 lb 13 oz) F CS-LTranv  Y Y      Social History   Social History  . Marital Status: Single    Spouse Name: N/A  . Number of Children: N/A  . Years of Education: N/A   Occupational History  . Not on file.   Social History Main Topics  . Smoking status: Former Smoker -- 0.50 packs/day for 7 years    Types: Cigarettes    Quit date: 03/08/2014  . Smokeless tobacco: Never Used  . Alcohol Use: Yes     Comment: occ  . Drug Use: No  . Sexual Activity: Yes    Birth Control/ Protection: None   Other Topics Concern  . Not on file   Social History Narrative   Bactrim; Flagyl; Lubricants; and Penicillins   Prenatal Course: uncomplicated.   Prenatal Transfer Tool  Maternal Diabetes: No Genetic Screening: Normal Maternal Ultrasounds/Referrals: Normal Fetal Ultrasounds or other Referrals:  None Maternal Substance Abuse:  No Significant Maternal Medications:  None Significant Maternal Lab Results: None   There were no vitals filed for this visit.   Lungs/Cor:  NAD Abdomen:  soft, gravid Ex:  no cords, erythema SVE:  NA FHTs:  present  A/P   For repeat cesarean sectionat term.  All risks, benefits and alternatives discussed with patient and she desires to proceed.  Lissete Maestas A

## 2015-03-07 ENCOUNTER — Encounter (HOSPITAL_COMMUNITY): Payer: Self-pay | Admitting: Obstetrics and Gynecology

## 2015-03-07 LAB — CBC
HCT: 29.1 % — ABNORMAL LOW (ref 36.0–46.0)
Hemoglobin: 10 g/dL — ABNORMAL LOW (ref 12.0–15.0)
MCH: 33.2 pg (ref 26.0–34.0)
MCHC: 34.4 g/dL (ref 30.0–36.0)
MCV: 96.7 fL (ref 78.0–100.0)
PLATELETS: 112 10*3/uL — AB (ref 150–400)
RBC: 3.01 MIL/uL — ABNORMAL LOW (ref 3.87–5.11)
RDW: 14.3 % (ref 11.5–15.5)
WBC: 5.4 10*3/uL (ref 4.0–10.5)

## 2015-03-07 MED ORDER — MEASLES, MUMPS & RUBELLA VAC ~~LOC~~ INJ: 0.5000 mL | INJECTION | Freq: Once | SUBCUTANEOUS | Status: DC

## 2015-03-07 MED ORDER — BACITRACIN-NEOMYCIN-POLYMYXIN OINTMENT TUBE
TOPICAL_OINTMENT | CUTANEOUS | Status: DC | PRN
  Filled 2015-03-07: qty 15

## 2015-03-07 NOTE — Progress Notes (Signed)
CPS report not accepted. 

## 2015-03-07 NOTE — Discharge Summary (Signed)
Obstetric Discharge Summary Reason for Admission: cesarean section Prenatal Procedures: none Intrapartum Procedures: cesarean: low cervical, transverse Postpartum Procedures: Rubella Ig Complications-Operative and Postpartum: none HEMOGLOBIN  Date Value Ref Range Status  03/07/2015 10.0* 12.0 - 15.0 g/dL Final   HCT  Date Value Ref Range Status  03/07/2015 29.1* 36.0 - 46.0 % Final    Physical Exam:  General: alert, cooperative and appears stated age 12Lochia: appropriate Uterine Fundus: firm Incision: healing well, no significant drainage DVT Evaluation: No evidence of DVT seen on physical exam.  Discharge Diagnoses: Term Pregnancy-delivered  Discharge Information: Date: 03/07/2015 Activity: pelvic rest Diet: routine Medications: PNV, Ibuprofen, Iron and Percocet Condition: stable Instructions: refer to practice specific booklet Discharge to: home Follow-up Information    Follow up with HORVATH,MICHELLE A, MD In 4 weeks.   Specialty:  Obstetrics and Gynecology   Contact information:   [address] [place] [id number] [phone]       Newborn Data: Live born female  Birth Weight: 5 lb 14 oz (2665 g) APGAR: 9, 9  Home with mother.  [facility] LLCDYANNA GEFFEL Clennon Nasca 03/07/2015, 7:56 PM

## 2015-03-07 NOTE — Clinical Social Work Maternal (Signed)
CLINICAL SOCIAL WORK MATERNAL/CHILD NOTE  Patient Details  Name: Brandy Ferguson MRN: [id number] Date of Birth: 08/27/1985  Date:  03/07/2015  Clinical Social Worker Initiating Note:  Reah Justo E. Brigitte Pulse, [place] Date/ Time Initiated:  03/07/15/1130     Child's Name:  Brandy Ferguson   Legal Guardian:  Mother   Need for Interpreter:  None   Date of Referral:  03/07/15     Reason for Referral:  Other (Comment) (Legal issues)   Referral Source:  [facility]   Address:  [address] [place], [place] [address]  Phone number:  [phone]   Household Members:  Minor Children (MOB lives with her 31 year old daughter Brandy Ferguson)   Natural Supports (not living in the home):  Immediate Family, Extended Family, Friends (MOB's mother and neice were here visiting with her today.)   Professional Supports: None   Employment:     Type of Work:     Education:      Pensions consultant:  Medicaid   Other Resources:      Cultural/Religious Considerations Which May Impact Care: None stated.  MOB's facesheet states religion as Psychologist, forensic.  Strengths:  Compliance with medical plan , Home prepared for child , Pediatrician chosen  (Pediatric follow up will be at [facility])   Risk Factors/Current Problems:  Legal Issues    Cognitive State:  Able to Concentrate , Alert , Linear Thinking , Goal Oriented    Mood/Affect:  Calm , Relaxed , Other (Comment) (Uninterested)   CSW Assessment: CSW met with MOB in her first floor room/124 to complete assessment based on report that [employer] came last night to put an ankle bracelet on her for house arrest.  MOB states that her mother and niece are here visiting with her and that CSW can speak openly with them present.  MOB appeared guarded in her responses and somewhat annoyed by CSW's visit.  She reports she and baby are well.  She was holding baby and bonding is evident.  She states she and FOB are in a relationship, but that he is currently incarcerate,  with an expected release in February.  CSW asked MOB how she feels about this and him not being able to be here.  She replied, "he'll be here soon."  CW inquired about MOB's ankle bracelet and she states she does not want to talk about it.  She reports no concern that she too may go to jail.  She reports having a great support system of family and friends.  Her mother was very quiet throughout entire assessment and niece went to the bathroom while CSW was in the room.  CSW asked about MOB's 16 year old daughter.  She reports that her daughter is currently with a cousin, but that MGM will be caring for her starting this afternoon.  MOB reports that her daughter typically lives with MOB while she is not in the hospital.  She reports that her older daughter's father is not involved and that they are both "used to it."  MOB reports she has all necessary supplies for infant at home. CSW provided education regarding perinatal mood disorders.  MOB was attentive, but seemed uninterested in the information given.  She reports no history of mental illness and no current emotional concerns.   CSW will make referral to [facility] in [facility].  While an ankle bracelet is concerning, CSW does not feel any further intervention is needed at this time.  MOB appears to be bonding to  the baby and has a good support system.  She reports no possibility of going to jail.    CSW Plan/Description:  Patient/Family Education , No Further Intervention Required/No Barriers to Discharge    Brandy Ferguson, [place] 03/07/2015, 1:09 PM

## 2015-03-07 NOTE — Discharge Instructions (Signed)

## 2015-03-07 NOTE — Progress Notes (Signed)
CSW initially had not planned to make CPS report, but remains concerned with how guarded MOB was during assessment and refusal to talk about her legal situation.  Since CSW does not have access to criminal records, and does not know what the situation is, CSW made report to Child Protective Services to ensure infant's safety.   

## 2015-03-07 NOTE — Addendum Note (Signed)
Addendum  created 03/07/15 0816 by Angela Adamana G Anber Mckiver, CRNA   Modules edited: Charges VN, Clinical Notes   Clinical Notes:  File: [id number]

## 2015-03-07 NOTE — Anesthesia Postprocedure Evaluation (Signed)
Anesthesia Post Note  Patient: Brandy Ferguson  Procedure(s) Performed: Procedure(s) (LRB): CESAREAN SECTION (N/A)  Patient location during evaluation: Mother Baby Anesthesia Type: Epidural Level of consciousness: awake and alert, oriented and patient cooperative Pain management: pain level controlled Vital Signs Assessment: post-procedure vital signs reviewed and stable Respiratory status: spontaneous breathing Cardiovascular status: stable Postop Assessment: no headache, epidural receding, patient able to bend at knees and no signs of nausea or vomiting Anesthetic complications: no    Last Vitals:  Filed Vitals:   03/07/15 0130 03/07/15 0530  BP: 113/85 104/70  Pulse: 67 66  Temp: 36.8 C 36.8 C  Resp: 18 18    Last Pain:  Filed Vitals:   03/07/15 0747  PainSc: 10-Worst pain ever                 Kaiser Fnd Hosp - Santa ClaraWRINKLE,Prathik Aman

## 2015-03-07 NOTE — Lactation Note (Signed)
This note was copied from the chart of Brandy Ferguson. Lactation Consultation Note; Mom has been giving mostly formula by bottle. Reports she has latched baby 3 times since delivery. LS by RN 9  Reports some pain with initial latch but eases off after the first few minutes. Reports she has pumped 3 times- only obtained drops. States she plans to bottle feed formula when she goes home. Just wants baby to get some Colostrum. Discussed engorgement prevention and treatment. No further questions at present. BF brochure given with resources for support after DC. To call prn  Patient Name: Brandy Ferguson ZOXWR'UToday's Date: 03/07/2015 Reason for consult: Initial assessment   Maternal Data Formula Feeding for Exclusion: Yes Reason for exclusion: Mother's choice to formula and breast feed on admission  Feeding Feeding Type: Formula Nipple Type: Slow - flow  LATCH Score/Interventions                      Lactation Tools Discussed/Used     Consult Status Consult Status: PRN    Pamelia HoitWeeks, Cataleyah Colborn D 03/07/2015, 11:34 AM

## 2015-03-07 NOTE — Progress Notes (Signed)
Patient is doing well.  She is tolerating PO, ambulating, voiding.  Pain is controlled.  Lochia is appropriate  Filed Vitals:   03/06/15 1720 03/06/15 2130 03/07/15 0130 03/07/15 0530  BP: 122/82  113/85 104/70  Pulse: 66  67 66  Temp: 97.5 F (36.4 C) 98 F (36.7 C) 98.3 F (36.8 C) 98.3 F (36.8 C)  TempSrc: Oral Oral Oral Oral  Resp: '18  18 18  ' SpO2: 93% 94% 96% 99%    NAD Abdomen:  soft, appropriate tenderness, incisions intact and without erythema or drainage ext:    Symmetric, no edema bilaterally  Lab Results  Component Value Date   WBC 5.4 03/07/2015   HGB 10.0* 03/07/2015   HCT 29.1* 03/07/2015   MCV 96.7 03/07/2015   PLT 112* 03/07/2015    --/--/O POS, O POS (12/27 1150)/RNI  A/P    29 y.o. G2P1002 POD 1 s/p RCS Routine post op and postpartum care.  MMR prior to d/c

## 2015-03-08 MED ORDER — IBUPROFEN 600 MG PO TABS: 600.0000 mg | ORAL_TABLET | Freq: Four times a day (QID) | ORAL | Status: DC | PRN

## 2015-03-08 MED ORDER — OXYCODONE-ACETAMINOPHEN 5-325 MG PO TABS: 1.0000 | ORAL_TABLET | Freq: Four times a day (QID) | ORAL | Status: DC | PRN

## 2015-03-08 MED ORDER — FERROUS SULFATE 325 (65 FE) MG PO TABS: 325.0000 mg | ORAL_TABLET | Freq: Every day | ORAL | Status: DC

## 2015-03-08 NOTE — Progress Notes (Signed)
Patient drove self to hospital and was planning to drive herself, infant, and older daughter home. Patient states that hospital did not think through her driving herself to hospital and is upset that we did not "think more long-term because now 'she' has no way to get her car home" and states "there is no reason to contact social work because they have no reason to be in "her" business".

## 2015-03-08 NOTE — Progress Notes (Signed)
Patient is doing well.  She is tolerating PO, ambulating, voiding.  Pain is controlled.  Lochia is appropriate  Filed Vitals:   03/07/15 0130 03/07/15 0530 03/07/15 1745 03/08/15 0623  BP: 113/85 104/70 108/78 121/70  Pulse: 67 66 68 66  Temp: 98.3 F (36.8 C) 98.3 F (36.8 C) 98.2 F (36.8 C) 97.9 F (36.6 C)  TempSrc: Oral Oral Oral Oral  Resp: '18 18 18 18  ' SpO2: 96% 99%      NAD Abdomen:  soft, appropriate tenderness, incisions intact and without erythema or drainage ext:    Symmetric, no edema bilaterally  Lab Results  Component Value Date   WBC 5.4 03/07/2015   HGB 10.0* 03/07/2015   HCT 29.1* 03/07/2015   MCV 96.7 03/07/2015   PLT 112* 03/07/2015    --/--/O POS, O POS (12/27 1150)/RNI  A/P    29 y.o. G2P1002 POD 2 s/p RCS Routine post op and postpartum care.  MMR prior to d/c Meeting all goals, d/c today

## 2015-03-21 NOTE — Progress Notes (Signed)
Post discharge chart review completed.  

## 2015-04-02 ENCOUNTER — Other Ambulatory Visit: Payer: Self-pay | Admitting: Obstetrics and Gynecology

## 2015-04-03 LAB — CYTOLOGY - PAP

## 2015-05-07 ENCOUNTER — Other Ambulatory Visit: Payer: Self-pay | Admitting: Obstetrics and Gynecology

## 2015-06-26 ENCOUNTER — Other Ambulatory Visit: Payer: Self-pay | Admitting: Obstetrics and Gynecology

## 2016-02-19 ENCOUNTER — Other Ambulatory Visit: Payer: Self-pay | Admitting: Obstetrics and Gynecology

## 2016-02-23 LAB — CYTOLOGY - PAP

## 2016-09-16 IMAGING — US US OB COMP LESS 14 WK
1 series · 13 of 28 positions shown · non-contrast
Comparison: Pelvic ultrasound performed 10/06/2004

CLINICAL DATA: Acute onset of generalized abdominal pain and
vomiting. Initial encounter.

EXAM:
OBSTETRIC <14 WK US AND TRANSVAGINAL OB US
TECHNIQUE: Both transabdominal and transvaginal ultrasound examinations were
performed for complete evaluation of the gestation as well as the
maternal uterus, adnexal regions, and pelvic cul-de-sac.
Transvaginal technique was performed to assess early pregnancy.

[Series 1: us ob comp less 14 wk · 13 of 83 slices shown]
[im 4/83]
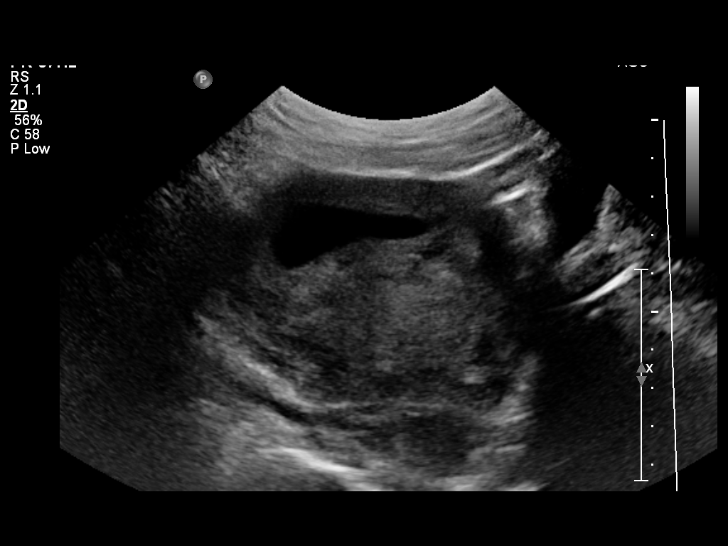
[im 10/83]
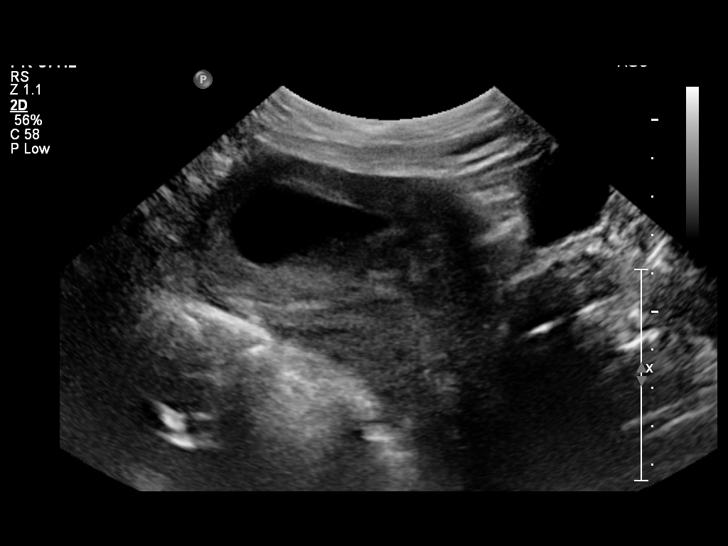
[im 16/83]
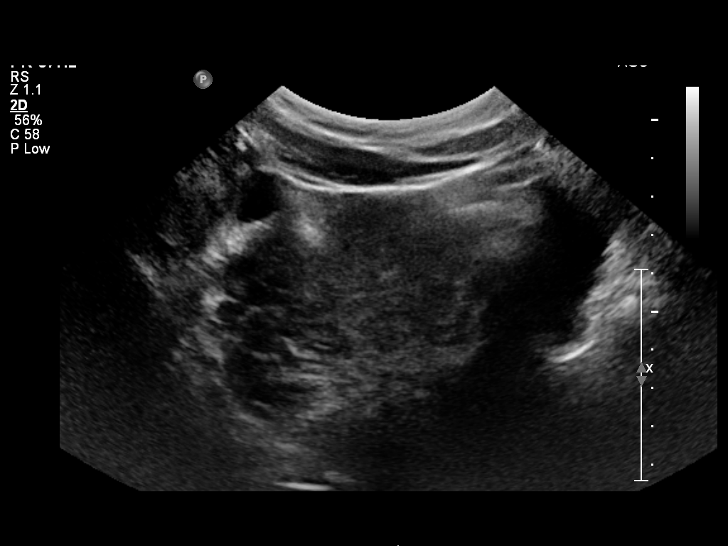
[im 22/83]
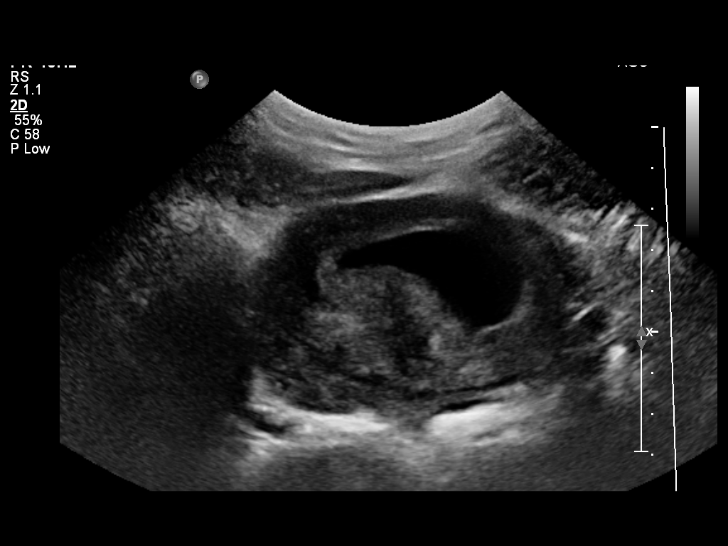
[im 28/83]
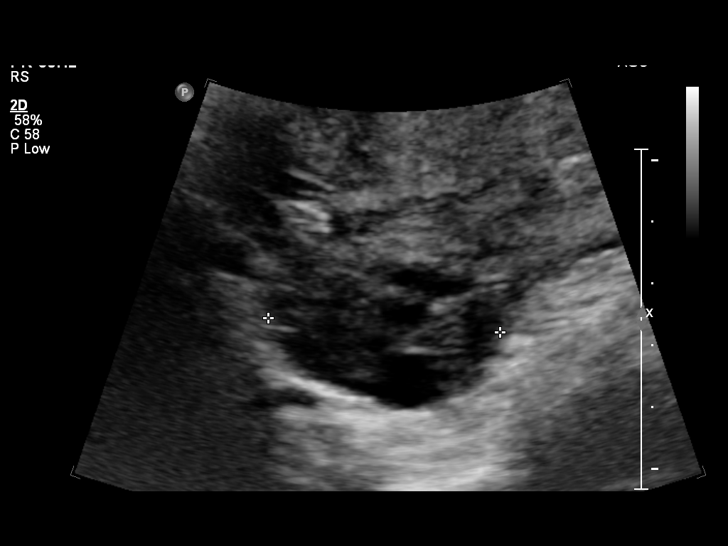
[im 34/83]
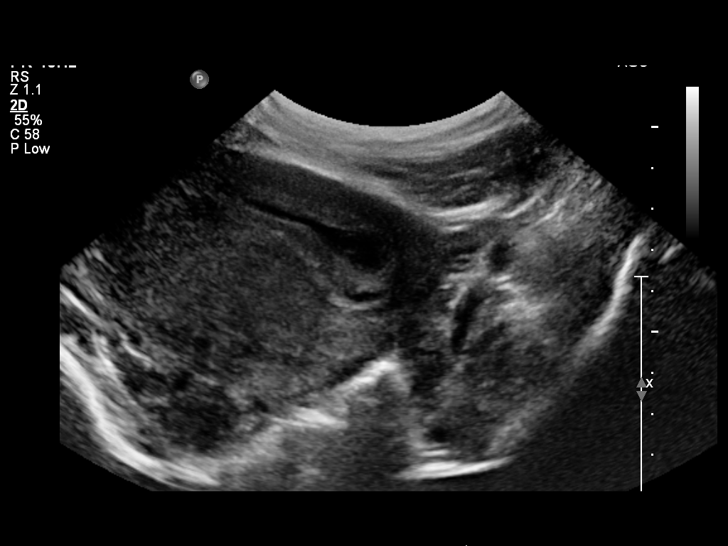
[im 43/83]
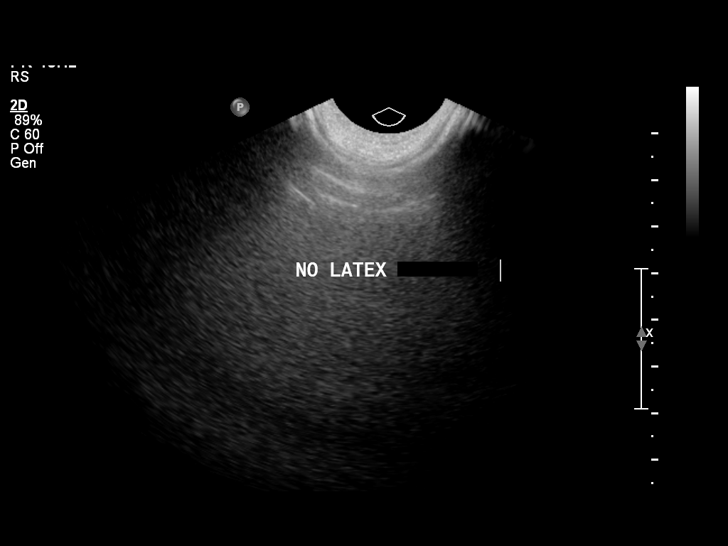
[im 49/83]
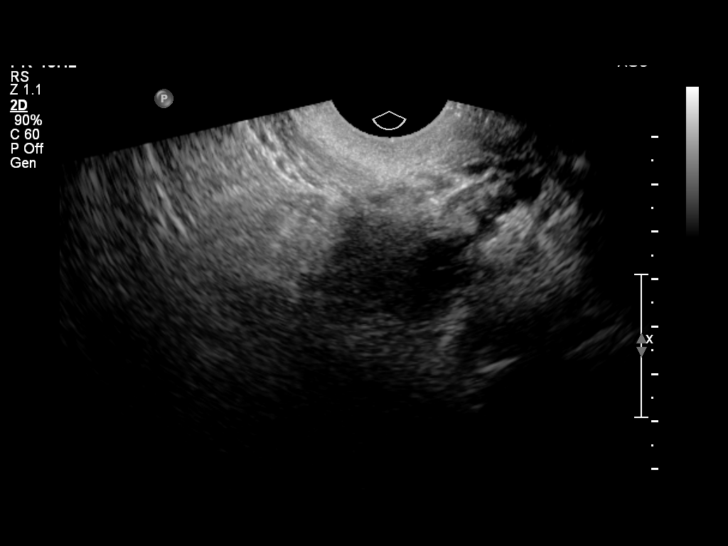
[im 55/83]
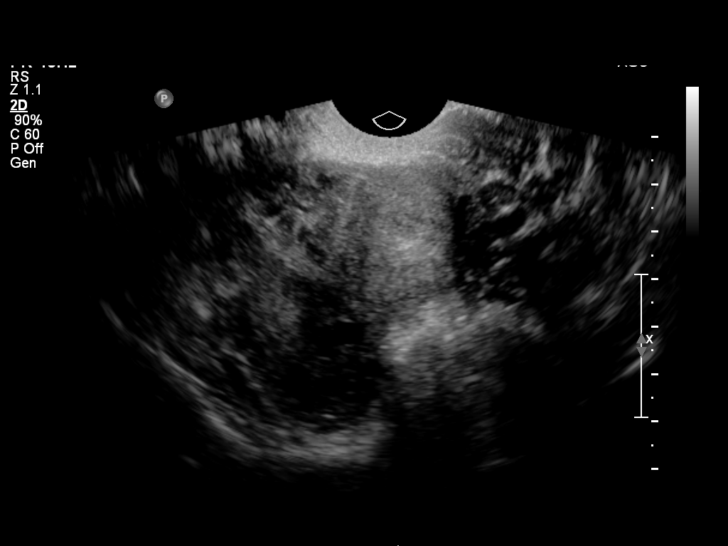
[im 61/83]
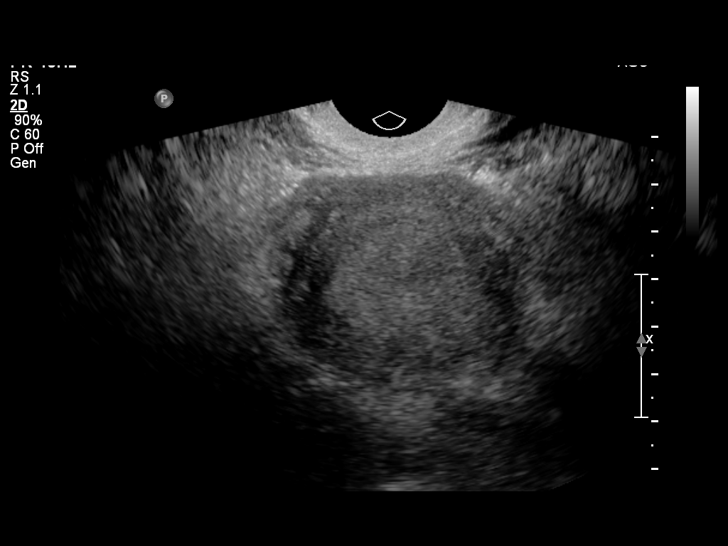
[im 67/83]
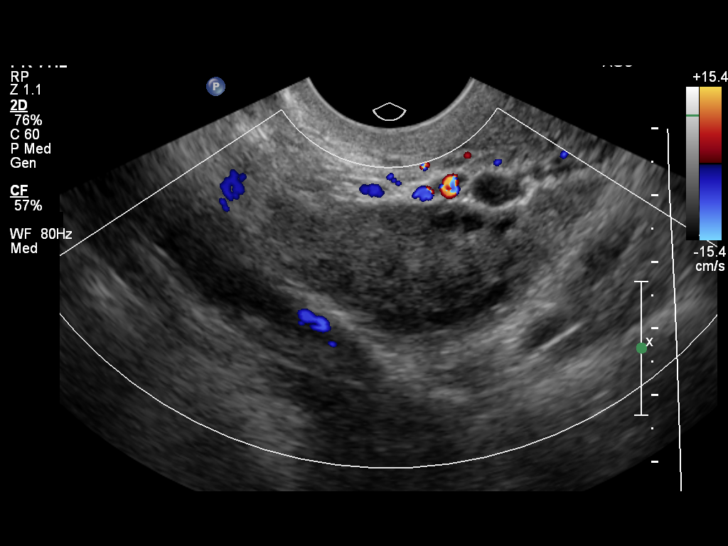
[im 73/83]
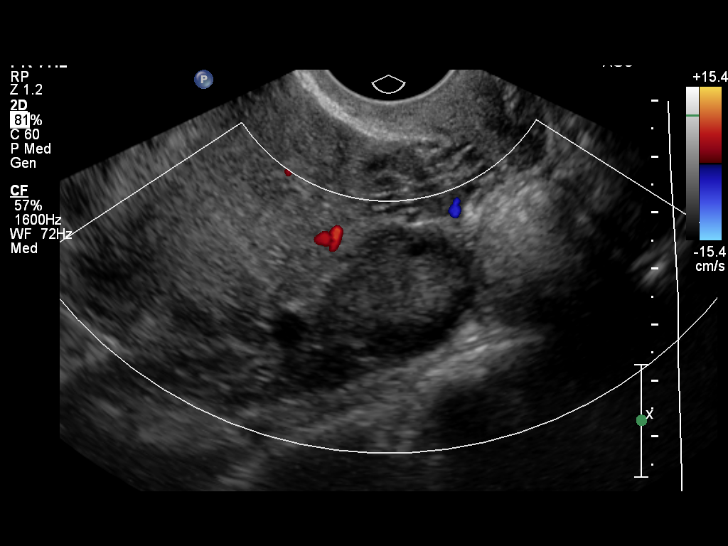
[im 79/83]
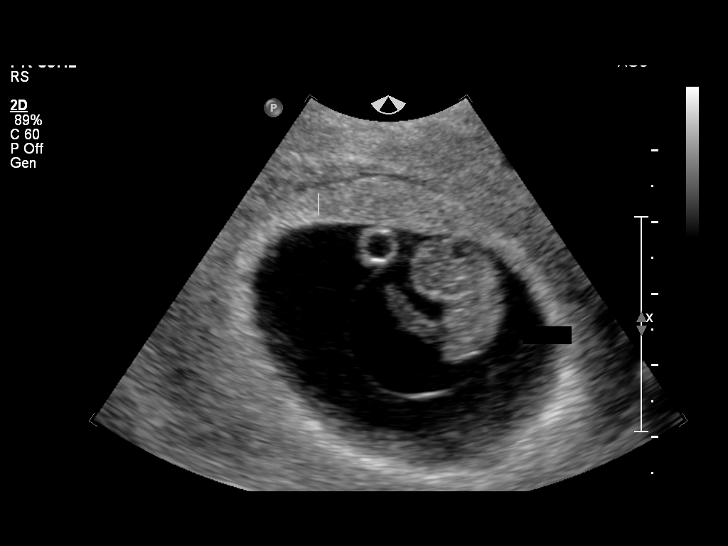

[13 of 28 positions shown; findings below may reference images not displayed]

FINDINGS: Intrauterine gestational sac: Visualized/normal in shape.

Yolk sac:  Yes

Embryo:  Yes

Cardiac Activity: Yes

Heart Rate: 152  bpm

CRL:  1.73  cm   8 w   2 d                  US EDC: 03/12/2015

Maternal uterus/adnexae: A small amount of subchorionic hemorrhage
is noted. The uterus is otherwise unremarkable.

The ovaries are within normal limits. The right ovary measures 4.7 x
2.3 x 2.6 cm, while the left ovary measures 3.8 x 2.0 x 2.3 cm. No
suspicious adnexal masses are seen; there is no evidence for ovarian
torsion.

No free fluid is seen within the pelvic cul-de-sac
IMPRESSION: 1. Single live intrauterine pregnancy noted, with a crown-rump
length of 1.7 cm, corresponding to a gestational age of 8 weeks 2
days. This matches the gestational age of 8 weeks 4 days by LMP,
reflecting an estimated date of delivery March 10, 2015.
2. Small amount of subchorionic hemorrhage noted.

## 2018-05-29 ENCOUNTER — Telehealth: Payer: Self-pay | Admitting: Obstetrics & Gynecology

## 2018-05-29 ENCOUNTER — Encounter: Payer: Self-pay | Admitting: Obstetrics & Gynecology

## 2018-05-29 NOTE — Telephone Encounter (Signed)
Called the patients via both lines available in Epic to inform the patient of the cancellation of the appointment. Also read the scripting via [place] on the patient voicemails. Informed the patient of the option to call our clinic if she has any questions.

## 2018-11-05 ENCOUNTER — Encounter (HOSPITAL_COMMUNITY): Payer: Self-pay | Admitting: Emergency Medicine

## 2018-11-05 ENCOUNTER — Other Ambulatory Visit: Payer: Self-pay

## 2018-11-05 ENCOUNTER — Ambulatory Visit (HOSPITAL_COMMUNITY)
Admission: EM | Admit: 2018-11-05 | Discharge: 2018-11-05 | Disposition: A | Discharge status: Home / Self Care | Payer: Medicaid Other | Attending: Emergency Medicine | Admitting: Emergency Medicine

## 2018-11-05 DIAGNOSIS — B029 Zoster without complications: Secondary | ICD-10-CM | POA: Diagnosis not present

## 2018-11-05 MED ORDER — HYDROCODONE-ACETAMINOPHEN 5-325 MG PO TABS: 2.0000 | ORAL_TABLET | ORAL | 0 refills | Status: DC | PRN

## 2018-11-05 MED ORDER — VALACYCLOVIR HCL 1 G PO TABS: 1000.0000 mg | ORAL_TABLET | Freq: Three times a day (TID) | ORAL | 0 refills | Status: DC

## 2018-11-05 NOTE — ED Provider Notes (Addendum)
[facility]    CSN: [id number] Arrival date & time: 11/05/18  1730      History   Chief Complaint Chief Complaint  Patient presents with  . Rash    HPI Brandy Ferguson is a 33 y.o. female.   Patient presents with a painful blister-like rash on her left flank area x 3-4 days.  She denies fever, chills, drainage, abdominal pain, back pain, or other symptoms.  No pertinent past medical history.  No treatments attempted at home.  No aggravating or alleviating factors.  LMP: implanted birth control.   The history is provided by the patient.    Past Medical History:  Diagnosis Date  . Anemia   . DOE (dyspnea on exertion)   . GERD (gastroesophageal reflux disease)    with pregnancy  . Rapid heart beat     Patient Active Problem List   Diagnosis Date Noted  . Postoperative state 03/06/2015  . Vomiting complicating pregnancy, antepartum 08/22/2014  . Nausea and vomiting during pregnancy prior to [redacted] weeks gestation 08/22/2014    Past Surgical History:  Procedure Laterality Date  . APPENDECTOMY    . CESAREAN SECTION    . CESAREAN SECTION N/A 03/06/2015   Procedure: CESAREAN SECTION;  Surgeon: Carrington Clamp, MD;  Location: [facility];  Service: Obstetrics;  Laterality: N/A;  EDD: 03/10/15  . WISDOM TOOTH EXTRACTION      OB History    Gravida  2   Para  2   Term  1   Preterm      AB      Living  2     SAB      TAB      Ectopic      Multiple  0   Live Births  2            Home Medications    Prior to Admission medications   Medication Sig Start Date End Date Taking? Authorizing Provider  ferrous sulfate 325 (65 FE) MG tablet Take 1 tablet (325 mg total) by mouth daily with breakfast. 03/08/15   Marlow Baars, MD  HYDROcodone-acetaminophen (NORCO/VICODIN) 5-325 MG tablet Take 2 tablets by mouth every 4 (four) hours as needed. 11/05/18   Mickie Bail, NP  ibuprofen (ADVIL,MOTRIN) 600 MG tablet Take 1 tablet (600 mg total) by mouth every 6 (six)  hours as needed for mild pain. 03/08/15   Marlow Baars, MD  oxyCODONE-acetaminophen (PERCOCET/ROXICET) 5-325 MG tablet Take 1-2 tablets by mouth every 6 (six) hours as needed for severe pain. Patient not taking: Reported on 11/05/2018 03/08/15   Marlow Baars, MD  Prenatal Vit-Fe Fumarate-FA (PRENATAL MULTIVITAMIN) TABS tablet Take 1 tablet by mouth daily. 08/23/14   Willodean Rosenthal, MD  valACYclovir (VALTREX) 1000 MG tablet Take 1 tablet (1,000 mg total) by mouth 3 (three) times daily. 11/05/18   Mickie Bail, NP    Family History Family History  Problem Relation Age of Onset  . Alcohol abuse Neg Hx   . Arthritis Neg Hx   . Asthma Neg Hx   . Birth defects Neg Hx   . Cancer Neg Hx   . COPD Neg Hx   . Depression Neg Hx   . Diabetes Neg Hx   . Drug abuse Neg Hx   . Early death Neg Hx   . Hearing loss Neg Hx   . Heart disease Neg Hx   . Hyperlipidemia Neg Hx   . Kidney disease Neg Hx   .  Learning disabilities Neg Hx   . Mental illness Neg Hx   . Mental retardation Neg Hx   . Miscarriages / Stillbirths Neg Hx   . Stroke Neg Hx   . Vision loss Neg Hx   . Varicose Veins Neg Hx   . Hypertension Mother     Social History Social History   Tobacco Use  . Smoking status: Former Smoker    Packs/day: 0.50    Years: 7.00    Pack years: 3.50    Types: Cigarettes    Quit date: 03/08/2014    Years since quitting: 4.6  . Smokeless tobacco: Never Used  Substance Use Topics  . Alcohol use: Yes    Comment: occ  . Drug use: No     Allergies   Bactrim [sulfamethoxazole-trimethoprim], Flagyl [metronidazole], Lubricants, and Penicillins   Review of Systems Review of Systems  Constitutional: Negative for chills and fever.  HENT: Negative for ear pain and sore throat.   Eyes: Negative for pain and visual disturbance.  Respiratory: Negative for cough and shortness of breath.   Cardiovascular: Negative for chest pain and palpitations.  Gastrointestinal: Negative for abdominal  pain and vomiting.  Genitourinary: Negative for dysuria and hematuria.  Musculoskeletal: Negative for arthralgias and back pain.  Skin: Positive for rash. Negative for color change.  Neurological: Negative for seizures, syncope, weakness and numbness.  All other systems reviewed and are negative.    Physical Exam Triage Vital Signs ED Triage Vitals [11/05/18 1745]  Enc Vitals Group     BP 119/89     Pulse Rate 69     Resp 18     Temp 98 F (36.7 C)     Temp Source Temporal     SpO2 99 %     Weight      Height      Head Circumference      Peak Flow      Pain Score 7     Pain Loc      Pain Edu?      Excl. in GC?    No data found.  Updated Vital Signs BP 119/89 (BP Location: Left Arm)   Pulse 69   Temp 98 F (36.7 C) (Temporal)   Resp 18   SpO2 99%   Visual Acuity Right Eye Distance:   Left Eye Distance:   Bilateral Distance:    Right Eye Near:   Left Eye Near:    Bilateral Near:     Physical Exam Vitals signs and nursing note reviewed.  Constitutional:      General: She is not in acute distress.    Appearance: She is well-developed.  HENT:     Head: Normocephalic and atraumatic.     Right Ear: Tympanic membrane normal.     Left Ear: Tympanic membrane normal.     Nose: Nose normal.     Mouth/Throat:     Mouth: Mucous membranes are moist.     Pharynx: Oropharynx is clear.  Eyes:     Extraocular Movements: Extraocular movements intact.     Conjunctiva/sclera: Conjunctivae normal.     Pupils: Pupils are equal, round, and reactive to light.  Neck:     Musculoskeletal: Neck supple.  Cardiovascular:     Rate and Rhythm: Normal rate and regular rhythm.     Heart sounds: No murmur.  Pulmonary:     Effort: Pulmonary effort is normal. No respiratory distress.     Breath sounds: Normal breath sounds.  Abdominal:     Palpations: Abdomen is soft.     Tenderness: There is no abdominal tenderness. There is no right CVA tenderness, left CVA tenderness, guarding  or rebound.  Skin:    General: Skin is warm and dry.     Findings: Rash present.     Comments: Tender, vesicular rash on left flank; follows dermatome.  Neurological:     General: No focal deficit present.     Mental Status: She is alert and oriented to person, place, and time.     Sensory: No sensory deficit.     Motor: No weakness.      UC Treatments / Results  Labs (all labs ordered are listed, but only abnormal results are displayed) Labs Reviewed - No data to display  EKG   Radiology No results found.  Procedures Procedures (including critical care time)  Medications Ordered in UC Medications - No data to display  Initial Impression / Assessment and Plan / UC Course  I have reviewed the triage vital signs and the nursing notes.  Pertinent labs & imaging results that were available during my care of the patient were reviewed by me and considered in my medical decision making (see chart for details).    Herpes Zoster.  Treating with valacyclovir and Norco for pain.  Instructed patient not to drive, operate machinery, or drink alcohol with the Norco.  Discussed signs of infection with patient and instructed her to return here if she has redness, purulent drainage, increased pain, fever, or other concerns.  Patient agrees with plan of care.     Final Clinical Impressions(s) / UC Diagnoses   Final diagnoses:  Herpes zoster without complication     Discharge Instructions     Take the antiviral medication 3 times a day for 7 days.    Take the prescribed pain medication as needed for your pain; do not drive, operate machinery, or drink alcohol with this medication.    Return here or go to the emergency department if you have signs of infection such as redness, pus-like drainage, increased pain, fever, or chills.        ED Prescriptions    Medication Sig Dispense Auth. Provider   valACYclovir (VALTREX) 1000 MG tablet Take 1 tablet (1,000 mg total) by mouth 3  (three) times daily. 21 tablet Sharion Balloon, NP   HYDROcodone-acetaminophen (NORCO/VICODIN) 5-325 MG tablet Take 2 tablets by mouth every 4 (four) hours as needed. 10 tablet Sharion Balloon, NP     Controlled Substance Prescriptions [place] Controlled Substance Registry consulted? Yes, I have consulted the [place] Controlled Substances Registry for this patient, and feel the risk/benefit ratio today is favorable for proceeding with this prescription for a controlled substance.   Sharion Balloon, NP 11/05/18 1827    Sharion Balloon, NP 11/05/18 [phone]

## 2018-11-05 NOTE — ED Triage Notes (Signed)
Pt with painful rash to left flank area that does not cross midline and has noted blisters

## 2018-11-05 NOTE — Discharge Instructions (Addendum)
Take the antiviral medication 3 times a day for 7 days.    Take the prescribed pain medication as needed for your pain; do not drive, operate machinery, or drink alcohol with this medication.    Return here or go to the emergency department if you have signs of infection such as redness, pus-like drainage, increased pain, fever, or chills.

## 2018-12-06 ENCOUNTER — Telehealth: Payer: Self-pay | Admitting: Obstetrics and Gynecology

## 2018-12-06 NOTE — Telephone Encounter (Signed)
Spoke to patient about her appointment on 10/1 @ 8:55. Patient instructed to wear a face mask for the entire appointment and no visitors are allowed with her during the visit. Patient screened for covid symptoms and denied having any °

## 2018-12-07 ENCOUNTER — Other Ambulatory Visit: Payer: Self-pay

## 2018-12-07 ENCOUNTER — Other Ambulatory Visit (HOSPITAL_COMMUNITY)
Admission: RE | Admit: 2018-12-07 | Discharge: 2018-12-07 | Disposition: A | Discharge status: Home / Self Care | Payer: Medicaid Other | Source: Ambulatory Visit | Attending: Obstetrics & Gynecology | Admitting: Obstetrics & Gynecology

## 2018-12-07 ENCOUNTER — Encounter: Payer: Self-pay | Admitting: Obstetrics and Gynecology

## 2018-12-07 ENCOUNTER — Ambulatory Visit (INDEPENDENT_AMBULATORY_CARE_PROVIDER_SITE_OTHER): Payer: Medicaid Other | Admitting: Obstetrics and Gynecology

## 2018-12-07 VITALS — BP 114/81 | HR 63 | Wt 134.0 lb

## 2018-12-07 DIAGNOSIS — Z01419 Encounter for gynecological examination (general) (routine) without abnormal findings: Secondary | ICD-10-CM

## 2018-12-07 DIAGNOSIS — Z23 Encounter for immunization: Secondary | ICD-10-CM

## 2018-12-07 DIAGNOSIS — Z3046 Encounter for surveillance of implantable subdermal contraceptive: Secondary | ICD-10-CM

## 2018-12-07 LAB — POCT PREGNANCY, URINE: Preg Test, Ur: NEGATIVE

## 2018-12-07 NOTE — Progress Notes (Signed)
No period since nexplanon.4e 6 days of bleeding last week, ended Friday  Pressure.

## 2018-12-07 NOTE — Progress Notes (Signed)
GYNECOLOGY ANNUAL PREVENTATIVE CARE ENCOUNTER NOTE  History:     Brandy Ferguson is a 33 y.o. G53P1002 female here for a routine annual gynecologic exam.  Current complaints: None.   Denies abnormal vaginal bleeding, discharge, pelvic pain, problems with intercourse or other gynecologic concerns.    Gynecologic History No LMP recorded. Contraception: Nexplanon Last Pap: 2017. Results were: normal with negative HPV Last mammogram: Na, no history of breast CA in family.   Obstetric History OB History  Gravida Para Term Preterm AB Living  2 2 1     2   SAB TAB Ectopic Multiple Live Births        0 2    # Outcome Date GA Lbr Len/2nd Weight Sex Delivery Anes PTL Lv  2 Term 03/06/15 [redacted]w[redacted]d  5 lb 14 oz (2.665 kg) F CS-LTranv Spinal  LIV  1 Para 05/11/04 [redacted]w[redacted]d  7 lb 13 oz (3.544 kg) F CS-LTranv  Y LIV    Past Medical History:  Diagnosis Date  . Anemia   . DOE (dyspnea on exertion)   . GERD (gastroesophageal reflux disease)    with pregnancy  . Rapid heart beat     Past Surgical History:  Procedure Laterality Date  . APPENDECTOMY    . CESAREAN SECTION    . CESAREAN SECTION N/A 03/06/2015   Procedure: CESAREAN SECTION;  Surgeon: Bobbye Charleston, MD;  Location: [facility];  Service: Obstetrics;  Laterality: N/A;  EDD: 03/10/15  . WISDOM TOOTH EXTRACTION      Current Outpatient Medications on File Prior to Visit  Medication Sig Dispense Refill  . ferrous sulfate 325 (65 FE) MG tablet Take 1 tablet (325 mg total) by mouth daily with breakfast. (Patient not taking: Reported on 12/07/2018) 30 tablet 3  . HYDROcodone-acetaminophen (NORCO/VICODIN) 5-325 MG tablet Take 2 tablets by mouth every 4 (four) hours as needed. (Patient not taking: Reported on 12/07/2018) 10 tablet 0  . ibuprofen (ADVIL,MOTRIN) 600 MG tablet Take 1 tablet (600 mg total) by mouth every 6 (six) hours as needed for mild pain. (Patient not taking: Reported on 12/07/2018) 40 tablet 0  . oxyCODONE-acetaminophen  (PERCOCET/ROXICET) 5-325 MG tablet Take 1-2 tablets by mouth every 6 (six) hours as needed for severe pain. (Patient not taking: Reported on 11/05/2018) 40 tablet 0  . Prenatal Vit-Fe Fumarate-FA (PRENATAL MULTIVITAMIN) TABS tablet Take 1 tablet by mouth daily. (Patient not taking: Reported on 12/07/2018) 30 tablet 1  . valACYclovir (VALTREX) 1000 MG tablet Take 1 tablet (1,000 mg total) by mouth 3 (three) times daily. (Patient not taking: Reported on 12/07/2018) 21 tablet 0   No current facility-administered medications on file prior to visit.     Allergies  Allergen Reactions  . Bactrim [Sulfamethoxazole-Trimethoprim] Hives  . Flagyl [Metronidazole] Hives  . Lubricants Hives    [place] jelly causes itching and hives  . Penicillins Other (See Comments)    Has patient had a PCN reaction causing immediate rash, facial/tongue/throat swelling, SOB or lightheadedness with hypotension: Yes Has patient had a PCN reaction causing severe rash involving mucus membranes or skin necrosis: No Has patient had a PCN reaction that required hospitalization Yes Has patient had a PCN reaction occurring within the last 10 years: No If all of the above answers are "NO", then may proceed with Cephalosporin use. Pt reports taking Amoxicillin without any adverse effects    Social History:  reports that she quit smoking about 4 years ago. Her smoking use included cigarettes. She has a 3.50  pack-year smoking history. She has never used smokeless tobacco. She reports current alcohol use. She reports that she does not use drugs.  Family History  Problem Relation Age of Onset  . Alcohol abuse Neg Hx   . Arthritis Neg Hx   . Asthma Neg Hx   . Birth defects Neg Hx   . Cancer Neg Hx   . COPD Neg Hx   . Depression Neg Hx   . Diabetes Neg Hx   . Drug abuse Neg Hx   . Early death Neg Hx   . Hearing loss Neg Hx   . Heart disease Neg Hx   . Hyperlipidemia Neg Hx   . Kidney disease Neg Hx   . Learning disabilities Neg Hx    . Mental illness Neg Hx   . Mental retardation Neg Hx   . Miscarriages / Stillbirths Neg Hx   . Stroke Neg Hx   . Vision loss Neg Hx   . Varicose Veins Neg Hx   . Hypertension Mother     The following portions of the patient's history were reviewed and updated as appropriate: allergies, current medications, past family history, past medical history, past social history, past surgical history and problem list.  Review of Systems Pertinent items noted in HPI and remainder of comprehensive ROS otherwise negative.  Physical Exam:  BP 114/81   Pulse 63   Wt 134 lb (60.8 kg)   BMI 23.74 kg/m  CONSTITUTIONAL: Well-developed, well-nourished female in no acute distress.  HENT:  Normocephalic, atraumatic, External right and left ear normal. Oropharynx is clear and moist EYES: Conjunctivae and EOM are normal. Pupils are equal, round, and reactive to light. No scleral icterus.  NECK: Normal range of motion, supple, no masses.  Normal thyroid.  SKIN: Skin is warm and dry. No rash noted. Not diaphoretic. No erythema. No pallor. MUSCULOSKELETAL: Normal range of motion. No tenderness.  No cyanosis, clubbing, or edema.  2+ distal pulses. NEUROLOGIC: Alert and oriented to person, place, and time. Normal reflexes, muscle tone coordination. No cranial nerve deficit noted. PSYCHIATRIC: Normal mood and affect. Normal behavior. Normal judgment and thought content. CARDIOVASCULAR: Normal heart rate noted, regular rhythm RESPIRATORY: Clear to auscultation bilaterally. Effort and breath sounds normal, no problems with respiration noted. BREASTS: Symmetric in size. No masses, skin changes, nipple drainage, or lymphadenopathy. ABDOMEN: Soft, normal bowel sounds, no distention noted.  No tenderness, rebound or guarding.  PELVIC: Normal appearing external genitalia; normal appearing vaginal mucosa and cervix.  No abnormal discharge noted.  Pap smear obtained.  Normal uterine size, no other palpable masses, no  uterine or adnexal tenderness.   Assessment and Plan:    1. Encounter for annual routine gynecological examination  - Cytology - PAP( )  2. Nexplanon removal  -Plan pregnancy.    Will follow up results of pap smear and manage accordingly. Routine preventative health maintenance measures emphasized. Please refer to After Visit Summary for other counseling recommendations.    Forrest Jaroszewski, Harolyn RutherfordJennifer I, NP [facility], [facility]       GYNECOLOGY OFFICE PROCEDURE NOTE  Brandy Ferguson is a 33 y.o. G2P1002 here for Nexplanon removal.   Last pap smear was 2017 and was normal.  No other gynecologic concerns.  Nexplanon Removal Patient identified, informed consent performed, consent signed.   Appropriate time out taken. Nexplanon site identified.  Area prepped in usual sterile fashon. One ml of 1% lidocaine was used to anesthetize the area at  the distal end of the implant. A small stab incision was made right beside the implant on the distal portion.  The Nexplanon rod was grasped using hemostats and removed without difficulty.  There was minimal blood loss. There were no complications.  3 ml of 1% lidocaine was injected around the incision for post-procedure analgesia.  Steri-strips were applied over the small incision.  A pressure bandage was applied to reduce any bruising.  The patient tolerated the procedure well and was given post procedure instructions.  Patient is planning pregnancy.     Brandy Ferguson, Harolyn Rutherford, NP [facility] [employer], [facility]

## 2018-12-07 NOTE — Addendum Note (Signed)
Addended by: Alric Seton on: 12/07/2018 11:20 AM   Modules accepted: Orders

## 2018-12-22 LAB — CYTOLOGY - PAP
Adequacy: ABSENT
Chlamydia: NEGATIVE
Comment: NEGATIVE
Comment: NEGATIVE
Comment: NORMAL
Diagnosis: NEGATIVE
High risk HPV: NEGATIVE
Neisseria Gonorrhea: NEGATIVE

## 2019-04-04 ENCOUNTER — Other Ambulatory Visit: Payer: Medicaid Other

## 2019-04-24 ENCOUNTER — Other Ambulatory Visit: Payer: Self-pay

## 2019-04-24 ENCOUNTER — Encounter (HOSPITAL_COMMUNITY): Payer: Self-pay | Admitting: Family Medicine

## 2019-04-24 ENCOUNTER — Inpatient Hospital Stay (HOSPITAL_COMMUNITY)
Admission: AD | Admit: 2019-04-24 | Discharge: 2019-04-25 | Disposition: A | Discharge status: Home / Self Care | Payer: Medicaid Other | Source: Ambulatory Visit | Attending: Family Medicine | Admitting: Family Medicine

## 2019-04-24 DIAGNOSIS — Z881 Allergy status to other antibiotic agents status: Secondary | ICD-10-CM | POA: Insufficient documentation

## 2019-04-24 DIAGNOSIS — Z87891 Personal history of nicotine dependence: Secondary | ICD-10-CM | POA: Insufficient documentation

## 2019-04-24 DIAGNOSIS — O99611 Diseases of the digestive system complicating pregnancy, first trimester: Secondary | ICD-10-CM | POA: Diagnosis not present

## 2019-04-24 DIAGNOSIS — Z3A01 Less than 8 weeks gestation of pregnancy: Secondary | ICD-10-CM | POA: Diagnosis not present

## 2019-04-24 DIAGNOSIS — Z880 Allergy status to penicillin: Secondary | ICD-10-CM | POA: Diagnosis not present

## 2019-04-24 DIAGNOSIS — K219 Gastro-esophageal reflux disease without esophagitis: Secondary | ICD-10-CM | POA: Insufficient documentation

## 2019-04-24 DIAGNOSIS — K117 Disturbances of salivary secretion: Secondary | ICD-10-CM | POA: Diagnosis not present

## 2019-04-24 DIAGNOSIS — O219 Vomiting of pregnancy, unspecified: Secondary | ICD-10-CM | POA: Insufficient documentation

## 2019-04-24 LAB — URINALYSIS, DIPSTICK ONLY
Bilirubin Urine: NEGATIVE
Glucose, UA: NEGATIVE mg/dL
Hgb urine dipstick: NEGATIVE
Ketones, ur: >80 mg/dL — AB
Leukocytes,Ua: NEGATIVE
Nitrite: NEGATIVE
Protein, ur: NEGATIVE mg/dL
Specific Gravity, Urine: 1.02 (ref 1.005–1.030)
pH: 7 (ref 5.0–8.0)

## 2019-04-24 LAB — POCT PREGNANCY, URINE: Preg Test, Ur: POSITIVE — AB

## 2019-04-24 MED ORDER — PROMETHAZINE HCL 25 MG/ML IJ SOLN
12.5000 mg | Freq: Once | INTRAMUSCULAR | Status: AC
  Administered 2019-04-25: 12.5 mg via INTRAVENOUS
  Filled 2019-04-24: qty 1

## 2019-04-24 MED ORDER — LACTATED RINGERS IV SOLN: INTRAVENOUS | Status: DC

## 2019-04-24 NOTE — MAU Note (Signed)
PT SAYS SHE DID HPT  LAST  Sunday- POSITIVE.  NO BC. HAD  NEXPLANON TAKEN OUT 12-07-18.  STARTED VOMITING ON Sunday .

## 2019-04-24 NOTE — MAU Provider Note (Signed)
Chief Complaint: Emesis   First Provider Initiated Contact with Patient 04/24/19 2331        SUBJECTIVE HPI: Brandy Ferguson is a 34 y.o. G3P1002 at [redacted]w[redacted]d by LMP who presents to maternity admissions reporting nausea and vomiting earlier this week. Had hyperemesis entire pregnancy before.  Has not tried anything at home yet.  Having a lot of saliva also.  Did not have that problem with other pregnancies. . She denies vaginal bleeding, vaginal itching/burning, urinary symptoms, h/a, dizziness, or fever/chills.    Emesis  This is a new problem. The current episode started in the past 7 days. The problem occurs 2 to 4 times per day. The problem has been unchanged. There has been no fever. Pertinent negatives include no abdominal pain, diarrhea, dizziness, fever or myalgias. She has tried nothing for the symptoms.   RN note: PT SAYS SHE DID HPT  LAST  Sunday- POSITIVE.  NO BC. HAD  NEXPLANON TAKEN OUT 12-07-18.  STARTED VOMITING ON Sunday .   Past Medical History:  Diagnosis Date  . Anemia   . DOE (dyspnea on exertion)   . GERD (gastroesophageal reflux disease)    with pregnancy  . Rapid heart beat    Past Surgical History:  Procedure Laterality Date  . APPENDECTOMY    . CESAREAN SECTION    . CESAREAN SECTION N/A 03/06/2015   Procedure: CESAREAN SECTION;  Surgeon: Carrington Clamp, MD;  Location: [facility];  Service: Obstetrics;  Laterality: N/A;  EDD: 03/10/15  . WISDOM TOOTH EXTRACTION     Social History   Socioeconomic History  . Marital status: Single    Spouse name: Not on file  . Number of children: Not on file  . Years of education: Not on file  . Highest education level: Not on file  Occupational History  . Not on file  Tobacco Use  . Smoking status: Former Smoker    Packs/day: 0.50    Years: 7.00    Pack years: 3.50    Types: Cigarettes    Quit date: 03/08/2014    Years since quitting: 5.1  . Smokeless tobacco: Never Used  Substance and Sexual Activity  . Alcohol use: Yes     Comment: occ  . Drug use: No  . Sexual activity: Yes    Birth control/protection: None  Other Topics Concern  . Not on file  Social History Narrative  . Not on file   Social Determinants of Health   Financial Resource Strain:   . Difficulty of Paying Living Expenses: Not on file  Food Insecurity:   . Worried About Programme researcher, broadcasting/film/video in the Last Year: Not on file  . Ran Out of Food in the Last Year: Not on file  Transportation Needs:   . Lack of Transportation (Medical): Not on file  . Lack of Transportation (Non-Medical): Not on file  Physical Activity:   . Days of Exercise per Week: Not on file  . Minutes of Exercise per Session: Not on file  Stress:   . Feeling of Stress : Not on file  Social Connections:   . Frequency of Communication with Friends and Family: Not on file  . Frequency of Social Gatherings with Friends and Family: Not on file  . Attends Religious Services: Not on file  . Active Member of Clubs or Organizations: Not on file  . Attends Banker Meetings: Not on file  . Marital Status: Not on file  Intimate Partner Violence:   .  Fear of Current or Ex-Partner: Not on file  . Emotionally Abused: Not on file  . Physically Abused: Not on file  . Sexually Abused: Not on file   No current facility-administered medications on file prior to encounter.   Current Outpatient Medications on File Prior to Encounter  Medication Sig Dispense Refill  . ferrous sulfate 325 (65 FE) MG tablet Take 1 tablet (325 mg total) by mouth daily with breakfast. (Patient not taking: Reported on 12/07/2018) 30 tablet 3  . HYDROcodone-acetaminophen (NORCO/VICODIN) 5-325 MG tablet Take 2 tablets by mouth every 4 (four) hours as needed. (Patient not taking: Reported on 12/07/2018) 10 tablet 0  . ibuprofen (ADVIL,MOTRIN) 600 MG tablet Take 1 tablet (600 mg total) by mouth every 6 (six) hours as needed for mild pain. (Patient not taking: Reported on 12/07/2018) 40 tablet 0  .  oxyCODONE-acetaminophen (PERCOCET/ROXICET) 5-325 MG tablet Take 1-2 tablets by mouth every 6 (six) hours as needed for severe pain. (Patient not taking: Reported on 11/05/2018) 40 tablet 0  . Prenatal Vit-Fe Fumarate-FA (PRENATAL MULTIVITAMIN) TABS tablet Take 1 tablet by mouth daily. (Patient not taking: Reported on 12/07/2018) 30 tablet 1  . valACYclovir (VALTREX) 1000 MG tablet Take 1 tablet (1,000 mg total) by mouth 3 (three) times daily. (Patient not taking: Reported on 12/07/2018) 21 tablet 0   Allergies  Allergen Reactions  . Bactrim [Sulfamethoxazole-Trimethoprim] Hives  . Flagyl [Metronidazole] Hives  . Lubricants Hives    KY jelly causes itching and hives  . Penicillins Other (See Comments)    Has patient had a PCN reaction causing immediate rash, facial/tongue/throat swelling, SOB or lightheadedness with hypotension: Yes Has patient had a PCN reaction causing severe rash involving mucus membranes or skin necrosis: No Has patient had a PCN reaction that required hospitalization Yes Has patient had a PCN reaction occurring within the last 10 years: No If all of the above answers are "NO", then may proceed with Cephalosporin use. Pt reports taking Amoxicillin without any adverse effects    I have reviewed patient's Past Medical Hx, Surgical Hx, Family Hx, Social Hx, medications and allergies.   ROS:  Review of Systems  Constitutional: Negative for fever.  Gastrointestinal: Positive for vomiting. Negative for abdominal pain and diarrhea.  Musculoskeletal: Negative for myalgias.  Neurological: Negative for dizziness.   Review of Systems  Other systems negative   Physical Exam  Physical Exam Patient Vitals for the past 24 hrs:  BP Temp Pulse Resp SpO2 Height Weight  04/24/19 2256 117/80 98.7 F (37.1 C) 69 20 100 % 5\' 3"  (1.6 m) 61.4 kg   Constitutional: Well-developed female in no acute distress.  Cardiovascular: normal rate Respiratory: normal effort GI: Abd soft,  non-tender. Pos BS x 4 MS: Extremities nontender, no edema, normal ROM Neurologic: Alert and oriented x 4.  GU: Neg CVAT.  PELVIC EXAM: deferred  LAB RESULTS Results for orders placed or performed during the hospital encounter of 04/24/19 (from the past 24 hour(s))  Pregnancy, urine POC     Status: Abnormal   Collection Time: 04/24/19 11:08 PM  Result Value Ref Range   Preg Test, Ur POSITIVE (A) NEGATIVE  Urinalysis, dipstick only     Status: Abnormal   Collection Time: 04/24/19 11:18 PM  Result Value Ref Range   Color, Urine YELLOW YELLOW   APPearance CLOUDY (A) CLEAR   Specific Gravity, Urine 1.020 1.005 - 1.030   pH 7.0 5.0 - 8.0   Glucose, UA NEGATIVE NEGATIVE mg/dL   Hgb  urine dipstick NEGATIVE NEGATIVE   Bilirubin Urine NEGATIVE NEGATIVE   Ketones, ur >80 (A) NEGATIVE mg/dL   Protein, ur NEGATIVE NEGATIVE mg/dL   Nitrite NEGATIVE NEGATIVE   Leukocytes,Ua NEGATIVE NEGATIVE  Basic metabolic panel     Status: Abnormal   Collection Time: 04/25/19 12:01 AM  Result Value Ref Range   Sodium 135 135 - 145 mmol/L   Potassium 3.4 (L) 3.5 - 5.1 mmol/L   Chloride 103 98 - 111 mmol/L   CO2 21 (L) 22 - 32 mmol/L   Glucose, Bld 92 70 - 99 mg/dL   BUN 5 (L) 6 - 20 mg/dL   Creatinine, Ser 0.62 0.44 - 1.00 mg/dL   Calcium 9.2 8.9 - 10.3 mg/dL   GFR calc non Af Amer >60 >60 mL/min   GFR calc Af Amer >60 >60 mL/min   Anion gap 11 5 - 15      IMAGING No results found.  MAU Management/MDM: Ordered IV hydration and Promethazine for nausea Lab results reviewed.  There are ketones in urine and potassium is low normal  Got good results from Promethazine Pepcid given for acid reflux per request Robinul given for ptyalism per request Discussed home care after discharge Will use Phenergan for nightime and requests zofran for during the day so she can sleep Plans care with [facility] again  ASSESSMENT Pregnancy at [redacted]w[redacted]d Nausea and vomiting of pregnancy Ptyalism  PLAN Discharge  home Rx Phenergan and zofran for nausea Rx protonix for acid reflux Rx Robinul for ptyalism Followup at [facility] for prenatal care Pt stable at time of discharge. Encouraged to return here or to other Urgent Care/ED if she develops worsening of symptoms, increase in pain, fever, or other concerning symptoms.    Hansel Feinstein CNM, MSN Certified Nurse-Midwife 04/24/2019  11:31 PM

## 2019-04-25 ENCOUNTER — Encounter (HOSPITAL_COMMUNITY): Payer: Self-pay | Admitting: Family Medicine

## 2019-04-25 DIAGNOSIS — Z3A01 Less than 8 weeks gestation of pregnancy: Secondary | ICD-10-CM

## 2019-04-25 DIAGNOSIS — O219 Vomiting of pregnancy, unspecified: Secondary | ICD-10-CM

## 2019-04-25 LAB — BASIC METABOLIC PANEL
Anion gap: 11 (ref 5–15)
BUN: 5 mg/dL — ABNORMAL LOW (ref 6–20)
CO2: 21 mmol/L — ABNORMAL LOW (ref 22–32)
Calcium: 9.2 mg/dL (ref 8.9–10.3)
Chloride: 103 mmol/L (ref 98–111)
Creatinine, Ser: 0.62 mg/dL (ref 0.44–1.00)
GFR calc Af Amer: >60 mL/min (ref >60)
GFR calc non Af Amer: >60 mL/min (ref >60)
Glucose, Bld: 92 mg/dL (ref 70–99)
Potassium: 3.4 mmol/L — ABNORMAL LOW (ref 3.5–5.1)
Sodium: 135 mmol/L (ref 135–145)

## 2019-04-25 MED ORDER — DEXAMETHASONE SODIUM PHOSPHATE 10 MG/ML IJ SOLN
10.0000 mg | Freq: Once | INTRAMUSCULAR | Status: AC
  Administered 2019-04-25: 02:00:00 10 mg via INTRAVENOUS
  Filled 2019-04-25: qty 1

## 2019-04-25 MED ORDER — GLYCOPYRROLATE 1 MG PO TABS: 1.0000 mg | ORAL_TABLET | Freq: Three times a day (TID) | ORAL | 0 refills | Status: DC | PRN

## 2019-04-25 MED ORDER — ONDANSETRON 4 MG PO TBDP: 4.0000 mg | ORAL_TABLET | Freq: Four times a day (QID) | ORAL | 0 refills | Status: DC | PRN

## 2019-04-25 MED ORDER — PANTOPRAZOLE SODIUM 20 MG PO TBEC: 20.0000 mg | DELAYED_RELEASE_TABLET | Freq: Every day | ORAL | 0 refills | Status: DC

## 2019-04-25 MED ORDER — ONDANSETRON HCL 4 MG/2ML IJ SOLN
4.0000 mg | Freq: Once | INTRAMUSCULAR | Status: AC
  Administered 2019-04-25: 02:00:00 4 mg via INTRAVENOUS
  Filled 2019-04-25: qty 2

## 2019-04-25 MED ORDER — GLYCOPYRROLATE 1 MG PO TABS
1.0000 mg | ORAL_TABLET | Freq: Once | ORAL | Status: AC
  Administered 2019-04-25: 03:00:00 1 mg via ORAL
  Filled 2019-04-25: qty 1

## 2019-04-25 MED ORDER — PROMETHAZINE HCL 25 MG PO TABS: 25.0000 mg | ORAL_TABLET | Freq: Four times a day (QID) | ORAL | 2 refills | Status: DC | PRN

## 2019-04-25 NOTE — Discharge Instructions (Signed)

## 2019-04-27 ENCOUNTER — Other Ambulatory Visit: Payer: Self-pay

## 2019-04-27 ENCOUNTER — Inpatient Hospital Stay (HOSPITAL_COMMUNITY)
Admission: AD | Admit: 2019-04-27 | Discharge: 2019-04-28 | Disposition: A | Discharge status: Home / Self Care | Payer: Medicaid Other | Attending: Obstetrics & Gynecology | Admitting: Obstetrics & Gynecology

## 2019-04-27 ENCOUNTER — Encounter (HOSPITAL_COMMUNITY): Payer: Self-pay | Admitting: Obstetrics & Gynecology

## 2019-04-27 DIAGNOSIS — O99611 Diseases of the digestive system complicating pregnancy, first trimester: Secondary | ICD-10-CM | POA: Insufficient documentation

## 2019-04-27 DIAGNOSIS — Z3A01 Less than 8 weeks gestation of pregnancy: Secondary | ICD-10-CM | POA: Insufficient documentation

## 2019-04-27 DIAGNOSIS — Z882 Allergy status to sulfonamides status: Secondary | ICD-10-CM | POA: Insufficient documentation

## 2019-04-27 DIAGNOSIS — K219 Gastro-esophageal reflux disease without esophagitis: Secondary | ICD-10-CM | POA: Insufficient documentation

## 2019-04-27 DIAGNOSIS — O09521 Supervision of elderly multigravida, first trimester: Secondary | ICD-10-CM | POA: Insufficient documentation

## 2019-04-27 DIAGNOSIS — Z8249 Family history of ischemic heart disease and other diseases of the circulatory system: Secondary | ICD-10-CM | POA: Insufficient documentation

## 2019-04-27 DIAGNOSIS — O34219 Maternal care for unspecified type scar from previous cesarean delivery: Secondary | ICD-10-CM | POA: Insufficient documentation

## 2019-04-27 DIAGNOSIS — Z880 Allergy status to penicillin: Secondary | ICD-10-CM | POA: Insufficient documentation

## 2019-04-27 DIAGNOSIS — Z87891 Personal history of nicotine dependence: Secondary | ICD-10-CM | POA: Insufficient documentation

## 2019-04-27 DIAGNOSIS — R519 Headache, unspecified: Secondary | ICD-10-CM | POA: Insufficient documentation

## 2019-04-27 DIAGNOSIS — O26891 Other specified pregnancy related conditions, first trimester: Secondary | ICD-10-CM | POA: Insufficient documentation

## 2019-04-27 DIAGNOSIS — Z881 Allergy status to other antibiotic agents status: Secondary | ICD-10-CM | POA: Insufficient documentation

## 2019-04-27 DIAGNOSIS — O219 Vomiting of pregnancy, unspecified: Secondary | ICD-10-CM | POA: Insufficient documentation

## 2019-04-27 LAB — URINALYSIS, ROUTINE W REFLEX MICROSCOPIC
Bilirubin Urine: NEGATIVE
Glucose, UA: NEGATIVE mg/dL
Hgb urine dipstick: NEGATIVE
Ketones, ur: 5 mg/dL — AB
Leukocytes,Ua: NEGATIVE
Nitrite: NEGATIVE
Protein, ur: NEGATIVE mg/dL
Specific Gravity, Urine: 1.01 (ref 1.005–1.030)
pH: 9 — ABNORMAL HIGH (ref 5.0–8.0)

## 2019-04-27 NOTE — MAU Note (Signed)
Pt here with nausea/vomiting and chest feeling like its on fire because of the vomiting. States she has not been able to keep anything down. Was seen a few days ago and given phenergan and zofran but unable to keep anything down since last night. Also reports headache.

## 2019-04-27 NOTE — MAU Provider Note (Signed)
History     CSN: [id number]  Arrival date and time: 04/27/19 2245   First Provider Initiated Contact with Patient 04/27/19 2359      Chief Complaint  Patient presents with  . Emesis  . Headache   Brandy Ferguson is a 34 y.o. G3P2002 at [redacted]w[redacted]d by Definite LMP of Mar 09, 2018.  She presents today for Emesis and Headache.  She states she has nausea, vomiting, and chest pain d/t throwing up. She also reports a headache that she also contributes to throwing up.  Patient states she has been unable to take her medication which includes phenergan and zofran.  She reports she has been trying to take the medication, but throws it right back up.  She attempted to take a dose at "8ish." She states she has attempted to eat fruit (fruit cup and oranges) for breakfast and baked chicken and rice for dinner, but subsequently threw up.  She denies vaginal discharge, itching, bleeding, burning, or odor.  She states she able to keep ice chips down "for a little bit."  She states she has tried to drink ginger ale and water. She rates her headache a 7/10 and reports "my chest hurts the worse right now..it's on fire."  Patient reports medication received the other night helped.  Patient also states that she takes all her medication orally and has not attempted any vaginal or rectal administration.   Of note, patient has had no vomiting, but has had some spitting while provider at bedside.       OB History    Gravida  3   Para  2   Term  2   Preterm      AB      Living  2     SAB      TAB      Ectopic      Multiple  0   Live Births  2           Past Medical History:  Diagnosis Date  . Anemia   . DOE (dyspnea on exertion)   . GERD (gastroesophageal reflux disease)    with pregnancy  . Rapid heart beat     Past Surgical History:  Procedure Laterality Date  . APPENDECTOMY    . CESAREAN SECTION    . CESAREAN SECTION N/A 03/06/2015   Procedure: CESAREAN SECTION;  Surgeon: Bobbye Charleston, MD;  Location: [facility];  Service: Obstetrics;  Laterality: N/A;  EDD: 03/10/15  . WISDOM TOOTH EXTRACTION      Family History  Problem Relation Age of Onset  . Hypertension Mother   . Alcohol abuse Neg Hx   . Arthritis Neg Hx   . Asthma Neg Hx   . Birth defects Neg Hx   . Cancer Neg Hx   . COPD Neg Hx   . Depression Neg Hx   . Diabetes Neg Hx   . Drug abuse Neg Hx   . Early death Neg Hx   . Hearing loss Neg Hx   . Heart disease Neg Hx   . Hyperlipidemia Neg Hx   . Kidney disease Neg Hx   . Learning disabilities Neg Hx   . Mental illness Neg Hx   . Mental retardation Neg Hx   . Miscarriages / Stillbirths Neg Hx   . Stroke Neg Hx   . Vision loss Neg Hx   . Varicose Veins Neg Hx     Social History   Tobacco Use  .  Smoking status: Former Smoker    Packs/day: 0.50    Years: 7.00    Pack years: 3.50    Types: Cigarettes    Quit date: 03/08/2014    Years since quitting: 5.1  . Smokeless tobacco: Never Used  Substance Use Topics  . Alcohol use: Yes    Comment: occ  . Drug use: No    Allergies:  Allergies  Allergen Reactions  . Bactrim [Sulfamethoxazole-Trimethoprim] Hives  . Flagyl [Metronidazole] Hives  . Lubricants Hives    KY jelly causes itching and hives  . Penicillins Other (See Comments)    Has patient had a PCN reaction causing immediate rash, facial/tongue/throat swelling, SOB or lightheadedness with hypotension: Yes Has patient had a PCN reaction causing severe rash involving mucus membranes or skin necrosis: No Has patient had a PCN reaction that required hospitalization Yes Has patient had a PCN reaction occurring within the last 10 years: No If all of the above answers are "NO", then may proceed with Cephalosporin use. Pt reports taking Amoxicillin without any adverse effects    Medications Prior to Admission  Medication Sig Dispense Refill Last Dose  . ondansetron (ZOFRAN ODT) 4 MG disintegrating tablet Take 1 tablet (4 mg total) by mouth  every 6 (six) hours as needed for nausea. 20 tablet 0 04/27/2019 at Unknown time  . pantoprazole (PROTONIX) 20 MG tablet Take 1 tablet (20 mg total) by mouth daily. 30 tablet 0 04/27/2019 at Unknown time  . promethazine (PHENERGAN) 25 MG tablet Take 1 tablet (25 mg total) by mouth every 6 (six) hours as needed for nausea or vomiting. 30 tablet 2 04/27/2019 at Unknown time  . ferrous sulfate 325 (65 FE) MG tablet Take 1 tablet (325 mg total) by mouth daily with breakfast. (Patient not taking: Reported on 12/07/2018) 30 tablet 3   . glycopyrrolate (ROBINUL) 1 MG tablet Take 1 tablet (1 mg total) by mouth every 8 (eight) hours as needed (excessive saliva). 30 tablet 0   . Prenatal Vit-Fe Fumarate-FA (PRENATAL MULTIVITAMIN) TABS tablet Take 1 tablet by mouth daily. (Patient not taking: Reported on 12/07/2018) 30 tablet 1   . valACYclovir (VALTREX) 1000 MG tablet Take 1 tablet (1,000 mg total) by mouth 3 (three) times daily. (Patient not taking: Reported on 12/07/2018) 21 tablet 0     Review of Systems  Constitutional: Negative for chills and fever.  Eyes: Negative for visual disturbance.  Respiratory: Negative for cough and shortness of breath.   Cardiovascular: Positive for chest pain.  Gastrointestinal: Positive for nausea and vomiting. Negative for abdominal pain, constipation and diarrhea.  Genitourinary: Negative for difficulty urinating, dysuria, vaginal bleeding and vaginal discharge.  Musculoskeletal: Negative for back pain.  Neurological: Positive for dizziness, light-headedness and headaches.   Physical Exam   Blood pressure 115/77, pulse 78, temperature 98.5 F (36.9 C), temperature source Oral, resp. rate 18, height 5\' 3"  (1.6 m), weight 62.1 kg, last menstrual period 03/10/2019, SpO2 100 %, unknown if currently breastfeeding.  Physical Exam  Vitals reviewed. Constitutional: She is oriented to person, place, and time. She appears well-developed and well-nourished.  HENT:  Head:  Normocephalic and atraumatic.  Eyes: Conjunctivae are normal.  Cardiovascular: Normal rate, regular rhythm and normal heart sounds.  Respiratory: Effort normal.  Musculoskeletal:        General: Normal range of motion.     Cervical back: Normal range of motion.  Neurological: She is alert and oriented to person, place, and time.  Skin: Skin is warm and  dry.  Psychiatric: She has a normal mood and affect. Her behavior is normal.    MAU Course  Procedures Results for orders placed or performed during the hospital encounter of 04/27/19 (from the past 24 hour(s))  Urinalysis, Routine w reflex microscopic     Status: Abnormal   Collection Time: 04/27/19 10:50 PM  Result Value Ref Range   Color, Urine YELLOW YELLOW   APPearance HAZY (A) CLEAR   Specific Gravity, Urine 1.010 1.005 - 1.030   pH 9.0 (H) 5.0 - 8.0   Glucose, UA NEGATIVE NEGATIVE mg/dL   Hgb urine dipstick NEGATIVE NEGATIVE   Bilirubin Urine NEGATIVE NEGATIVE   Ketones, ur 5 (A) NEGATIVE mg/dL   Protein, ur NEGATIVE NEGATIVE mg/dL   Nitrite NEGATIVE NEGATIVE   Leukocytes,Ua NEGATIVE NEGATIVE    MDM Start IV LR Bolus f/b Banana Bag Antiemetic PPI Assessment and Plan  34 year old, G3P2002  SIUP at 7 weeks Nausea and Vomiting  -Discussed administration of phenergan vaginally or rectally and patient states she does not desire to use those routes. -Reviewed POC with patient. -Will start LR infusion and give IV phenergan and pepcid. -Will await until fluids almost complete and assess patient headache.  Cherre Robins 04/27/2019, 11:59 PM   Reassessment (1:19 AM) -Nurse reports initial bag of fluid almost complete and patient with improvement in nausea, but HA remains. -Will give decadron now and reassess.  Reassessment (2:00 AM) -Patient reports headache has improved and is now 3/10. -Patient also reports chest pain has improved.  -However, patient states she had vomiting soon after decadron dosing. -Patient  states nausea remains the same. -Will give additional bag of fluid and zofran.   Reassessment (3:22 AM) -Patient reports improvement in nausea. -No questions or concerns. -Instructed to start prenatal care.  Patient states she will not be returning to [facility] and will instead go to [place] office. -Patient informed of [facility] which is closer to her home.  Number given in paperwork. -Encouraged to call or return to MAU if symptoms worsen or with the onset of new symptoms. -Discharged to home in improved condition.  Cherre Robins MSN, CNM Advanced Practice Provider, [facility] [employer] [facility]

## 2019-04-28 DIAGNOSIS — R519 Headache, unspecified: Secondary | ICD-10-CM | POA: Diagnosis not present

## 2019-04-28 DIAGNOSIS — Z880 Allergy status to penicillin: Secondary | ICD-10-CM | POA: Diagnosis not present

## 2019-04-28 DIAGNOSIS — O99611 Diseases of the digestive system complicating pregnancy, first trimester: Secondary | ICD-10-CM | POA: Diagnosis not present

## 2019-04-28 DIAGNOSIS — Z8249 Family history of ischemic heart disease and other diseases of the circulatory system: Secondary | ICD-10-CM | POA: Diagnosis not present

## 2019-04-28 DIAGNOSIS — Z881 Allergy status to other antibiotic agents status: Secondary | ICD-10-CM | POA: Diagnosis not present

## 2019-04-28 DIAGNOSIS — Z3A01 Less than 8 weeks gestation of pregnancy: Secondary | ICD-10-CM | POA: Diagnosis not present

## 2019-04-28 DIAGNOSIS — K219 Gastro-esophageal reflux disease without esophagitis: Secondary | ICD-10-CM | POA: Diagnosis not present

## 2019-04-28 DIAGNOSIS — O219 Vomiting of pregnancy, unspecified: Secondary | ICD-10-CM | POA: Diagnosis not present

## 2019-04-28 DIAGNOSIS — Z882 Allergy status to sulfonamides status: Secondary | ICD-10-CM | POA: Diagnosis not present

## 2019-04-28 DIAGNOSIS — O26891 Other specified pregnancy related conditions, first trimester: Secondary | ICD-10-CM | POA: Diagnosis not present

## 2019-04-28 DIAGNOSIS — O34219 Maternal care for unspecified type scar from previous cesarean delivery: Secondary | ICD-10-CM | POA: Diagnosis not present

## 2019-04-28 DIAGNOSIS — Z87891 Personal history of nicotine dependence: Secondary | ICD-10-CM | POA: Diagnosis not present

## 2019-04-28 DIAGNOSIS — O09521 Supervision of elderly multigravida, first trimester: Secondary | ICD-10-CM | POA: Diagnosis not present

## 2019-04-28 DIAGNOSIS — R1110 Vomiting, unspecified: Secondary | ICD-10-CM | POA: Diagnosis present

## 2019-04-28 MED ORDER — PROMETHAZINE HCL 25 MG/ML IJ SOLN
12.5000 mg | Freq: Once | INTRAMUSCULAR | Status: AC
  Administered 2019-04-28: 12.5 mg via INTRAVENOUS
  Filled 2019-04-28: qty 1

## 2019-04-28 MED ORDER — ONDANSETRON HCL 4 MG/2ML IJ SOLN
4.0000 mg | Freq: Once | INTRAMUSCULAR | Status: AC
  Administered 2019-04-28: 02:00:00 4 mg via INTRAVENOUS
  Filled 2019-04-28: qty 2

## 2019-04-28 MED ORDER — FAMOTIDINE IN NACL 20-0.9 MG/50ML-% IV SOLN
20.0000 mg | Freq: Once | INTRAVENOUS | Status: AC
  Administered 2019-04-28: 01:00:00 20 mg via INTRAVENOUS
  Filled 2019-04-28: qty 50

## 2019-04-28 MED ORDER — LACTATED RINGERS IV BOLUS
1000.0000 mL | Freq: Once | INTRAVENOUS | Status: AC
  Administered 2019-04-28: 02:00:00 1000 mL via INTRAVENOUS

## 2019-04-28 MED ORDER — DEXAMETHASONE SODIUM PHOSPHATE 10 MG/ML IJ SOLN
10.0000 mg | Freq: Once | INTRAMUSCULAR | Status: AC
  Administered 2019-04-28: 01:00:00 10 mg via INTRAVENOUS
  Filled 2019-04-28: qty 1

## 2019-04-28 MED ORDER — LACTATED RINGERS IV BOLUS
1000.0000 mL | Freq: Once | INTRAVENOUS | Status: AC
  Administered 2019-04-28: 1000 mL via INTRAVENOUS

## 2019-04-28 NOTE — Discharge Instructions (Signed)

## 2019-05-01 ENCOUNTER — Other Ambulatory Visit: Payer: Self-pay

## 2019-05-01 ENCOUNTER — Inpatient Hospital Stay (HOSPITAL_COMMUNITY)
Admission: AD | Admit: 2019-05-01 | Discharge: 2019-05-01 | Disposition: A | Discharge status: Home / Self Care | Payer: Medicaid Other | Attending: Family Medicine | Admitting: Family Medicine

## 2019-05-01 ENCOUNTER — Encounter (HOSPITAL_COMMUNITY): Payer: Self-pay | Admitting: Family Medicine

## 2019-05-01 DIAGNOSIS — Z881 Allergy status to other antibiotic agents status: Secondary | ICD-10-CM | POA: Insufficient documentation

## 2019-05-01 DIAGNOSIS — Z8249 Family history of ischemic heart disease and other diseases of the circulatory system: Secondary | ICD-10-CM | POA: Diagnosis not present

## 2019-05-01 DIAGNOSIS — Z880 Allergy status to penicillin: Secondary | ICD-10-CM | POA: Diagnosis not present

## 2019-05-01 DIAGNOSIS — O99611 Diseases of the digestive system complicating pregnancy, first trimester: Secondary | ICD-10-CM | POA: Insufficient documentation

## 2019-05-01 DIAGNOSIS — K219 Gastro-esophageal reflux disease without esophagitis: Secondary | ICD-10-CM | POA: Insufficient documentation

## 2019-05-01 DIAGNOSIS — Z87891 Personal history of nicotine dependence: Secondary | ICD-10-CM | POA: Diagnosis not present

## 2019-05-01 DIAGNOSIS — R1110 Vomiting, unspecified: Secondary | ICD-10-CM

## 2019-05-01 DIAGNOSIS — Z882 Allergy status to sulfonamides status: Secondary | ICD-10-CM | POA: Diagnosis not present

## 2019-05-01 DIAGNOSIS — O09521 Supervision of elderly multigravida, first trimester: Secondary | ICD-10-CM | POA: Diagnosis not present

## 2019-05-01 DIAGNOSIS — O34219 Maternal care for unspecified type scar from previous cesarean delivery: Secondary | ICD-10-CM | POA: Insufficient documentation

## 2019-05-01 DIAGNOSIS — O219 Vomiting of pregnancy, unspecified: Secondary | ICD-10-CM | POA: Diagnosis not present

## 2019-05-01 DIAGNOSIS — Z79899 Other long term (current) drug therapy: Secondary | ICD-10-CM | POA: Diagnosis not present

## 2019-05-01 DIAGNOSIS — Z3A01 Less than 8 weeks gestation of pregnancy: Secondary | ICD-10-CM | POA: Insufficient documentation

## 2019-05-01 LAB — URINALYSIS, ROUTINE W REFLEX MICROSCOPIC
Bacteria, UA: NONE SEEN
Bilirubin Urine: NEGATIVE
Glucose, UA: NEGATIVE mg/dL
Hgb urine dipstick: NEGATIVE
Ketones, ur: 80 mg/dL — AB
Leukocytes,Ua: NEGATIVE
Nitrite: NEGATIVE
Protein, ur: 30 mg/dL — AB
Specific Gravity, Urine: 1.03 (ref 1.005–1.030)
pH: 6 (ref 5.0–8.0)

## 2019-05-01 LAB — COMPREHENSIVE METABOLIC PANEL
ALT: 22 U/L (ref 0–44)
AST: 13 U/L — ABNORMAL LOW (ref 15–41)
Albumin: 4.2 g/dL (ref 3.5–5.0)
Alkaline Phosphatase: 26 U/L — ABNORMAL LOW (ref 38–126)
Anion gap: 10 (ref 5–15)
BUN: 7 mg/dL (ref 6–20)
CO2: 24 mmol/L (ref 22–32)
Calcium: 9.5 mg/dL (ref 8.9–10.3)
Chloride: 100 mmol/L (ref 98–111)
Creatinine, Ser: 0.72 mg/dL (ref 0.44–1.00)
GFR calc Af Amer: >60 mL/min (ref >60)
GFR calc non Af Amer: >60 mL/min (ref >60)
Glucose, Bld: 81 mg/dL (ref 70–99)
Potassium: 3.4 mmol/L — ABNORMAL LOW (ref 3.5–5.1)
Sodium: 134 mmol/L — ABNORMAL LOW (ref 135–145)
Total Bilirubin: 1.1 mg/dL (ref 0.3–1.2)
Total Protein: 7.6 g/dL (ref 6.5–8.1)

## 2019-05-01 LAB — CBC WITH DIFFERENTIAL/PLATELET
Abs Immature Granulocytes: 0.02 10*3/uL (ref 0.00–0.07)
Basophils Absolute: 0 10*3/uL (ref 0.0–0.1)
Basophils Relative: 0 %
Eosinophils Absolute: 0 10*3/uL (ref 0.0–0.5)
Eosinophils Relative: 1 %
HCT: 41.3 % (ref 36.0–46.0)
Hemoglobin: 14.5 g/dL (ref 12.0–15.0)
Immature Granulocytes: 0 %
Lymphocytes Relative: 30 %
Lymphs Abs: 1.7 10*3/uL (ref 0.7–4.0)
MCH: 33.6 pg (ref 26.0–34.0)
MCHC: 35.1 g/dL (ref 30.0–36.0)
MCV: 95.8 fL (ref 80.0–100.0)
Monocytes Absolute: 0.6 10*3/uL (ref 0.1–1.0)
Monocytes Relative: 11 %
Neutro Abs: 3.2 10*3/uL (ref 1.7–7.7)
Neutrophils Relative %: 58 %
Platelets: 248 10*3/uL (ref 150–400)
RBC: 4.31 MIL/uL (ref 3.87–5.11)
RDW: 11.8 % (ref 11.5–15.5)
WBC: 5.5 10*3/uL (ref 4.0–10.5)
nRBC: 0 % (ref 0.0–0.2)

## 2019-05-01 MED ORDER — THIAMINE HCL 100 MG/ML IJ SOLN
Freq: Once | INTRAVENOUS | Status: AC
  Filled 2019-05-01: qty 1000

## 2019-05-01 MED ORDER — PROMETHAZINE HCL 25 MG RE SUPP: 25.0000 mg | Freq: Four times a day (QID) | RECTAL | 3 refills | Status: DC | PRN

## 2019-05-01 MED ORDER — METOCLOPRAMIDE HCL 5 MG/ML IJ SOLN
10.0000 mg | Freq: Once | INTRAMUSCULAR | Status: AC
  Administered 2019-05-01: 21:00:00 10 mg via INTRAVENOUS
  Filled 2019-05-01: qty 2

## 2019-05-01 MED ORDER — METHYLPREDNISOLONE 4 MG PO TABS: ORAL_TABLET | ORAL | 0 refills | Status: DC

## 2019-05-01 MED ORDER — METOCLOPRAMIDE HCL 10 MG PO TABS: 10.0000 mg | ORAL_TABLET | Freq: Four times a day (QID) | ORAL | 3 refills | Status: DC

## 2019-05-01 MED ORDER — PANTOPRAZOLE SODIUM 40 MG IV SOLR
40.0000 mg | Freq: Once | INTRAVENOUS | Status: AC
  Administered 2019-05-01: 40 mg via INTRAVENOUS
  Filled 2019-05-01: qty 40

## 2019-05-01 MED ORDER — METHYLPREDNISOLONE SODIUM SUCC 125 MG IJ SOLR
48.0000 mg | Freq: Once | INTRAMUSCULAR | Status: AC
  Administered 2019-05-01: 21:00:00 48 mg via INTRAVENOUS
  Filled 2019-05-01: qty 2

## 2019-05-01 NOTE — MAU Provider Note (Signed)
History     CSN: [id number]  Arrival date and time: 05/01/19 6606   First Provider Initiated Contact with Patient 05/01/19 2001      Chief Complaint  Patient presents with  . Emesis   Brandy Ferguson is a 34 y.o. G3P2002 at [redacted]w[redacted]d who presents today with nausea and vomiting. This has been an ongoing issue, and this is her 3rd visit here for vomiting. She has taken phenergan around 11:00am , zofran around 3:30pm , robinol and something for acid reflux. She has also had popping in her ear since arriving here. She denies any vaginal bleeding or vaginal discharge.   Emesis  This is a new problem. The current episode started 1 to 4 weeks ago. The problem occurs more than 10 times per day. The problem has been unchanged. The emesis has an appearance of stomach contents. There has been no fever. Pertinent negatives include no chills, diarrhea or fever. Risk factors: pregnancy     OB History    Gravida  3   Para  2   Term  2   Preterm      AB      Living  2     SAB      TAB      Ectopic      Multiple  0   Live Births  2           Past Medical History:  Diagnosis Date  . Anemia   . DOE (dyspnea on exertion)   . GERD (gastroesophageal reflux disease)    with pregnancy  . Rapid heart beat     Past Surgical History:  Procedure Laterality Date  . APPENDECTOMY    . CESAREAN SECTION    . CESAREAN SECTION N/A 03/06/2015   Procedure: CESAREAN SECTION;  Surgeon: Carrington Clamp, MD;  Location: [facility];  Service: Obstetrics;  Laterality: N/A;  EDD: 03/10/15  . WISDOM TOOTH EXTRACTION      Family History  Problem Relation Age of Onset  . Hypertension Mother   . Alcohol abuse Neg Hx   . Arthritis Neg Hx   . Asthma Neg Hx   . Birth defects Neg Hx   . Cancer Neg Hx   . COPD Neg Hx   . Depression Neg Hx   . Diabetes Neg Hx   . Drug abuse Neg Hx   . Early death Neg Hx   . Hearing loss Neg Hx   . Heart disease Neg Hx   . Hyperlipidemia Neg Hx   . Kidney disease Neg  Hx   . Learning disabilities Neg Hx   . Mental illness Neg Hx   . Mental retardation Neg Hx   . Miscarriages / Stillbirths Neg Hx   . Stroke Neg Hx   . Vision loss Neg Hx   . Varicose Veins Neg Hx     Social History   Tobacco Use  . Smoking status: Former Smoker    Packs/day: 0.50    Years: 7.00    Pack years: 3.50    Types: Cigarettes    Quit date: 03/08/2014    Years since quitting: 5.1  . Smokeless tobacco: Never Used  Substance Use Topics  . Alcohol use: Yes    Comment: occ  . Drug use: No    Allergies:  Allergies  Allergen Reactions  . Bactrim [Sulfamethoxazole-Trimethoprim] Hives  . Flagyl [Metronidazole] Hives  . Lubricants Hives    KY jelly causes itching and hives  . Penicillins  Other (See Comments)    Has patient had a PCN reaction causing immediate rash, facial/tongue/throat swelling, SOB or lightheadedness with hypotension: Yes Has patient had a PCN reaction causing severe rash involving mucus membranes or skin necrosis: No Has patient had a PCN reaction that required hospitalization Yes Has patient had a PCN reaction occurring within the last 10 years: No If all of the above answers are "NO", then may proceed with Cephalosporin use. Pt reports taking Amoxicillin without any adverse effects    Medications Prior to Admission  Medication Sig Dispense Refill Last Dose  . glycopyrrolate (ROBINUL) 1 MG tablet Take 1 tablet (1 mg total) by mouth every 8 (eight) hours as needed (excessive saliva). 30 tablet 0 05/01/2019 at Unknown time  . ondansetron (ZOFRAN ODT) 4 MG disintegrating tablet Take 1 tablet (4 mg total) by mouth every 6 (six) hours as needed for nausea. 20 tablet 0 05/01/2019 at Unknown time  . pantoprazole (PROTONIX) 20 MG tablet Take 1 tablet (20 mg total) by mouth daily. 30 tablet 0 05/01/2019 at Unknown time  . promethazine (PHENERGAN) 25 MG tablet Take 1 tablet (25 mg total) by mouth every 6 (six) hours as needed for nausea or vomiting. 30 tablet 2  05/01/2019 at Unknown time  . ferrous sulfate 325 (65 FE) MG tablet Take 1 tablet (325 mg total) by mouth daily with breakfast. (Patient not taking: Reported on 12/07/2018) 30 tablet 3   . Prenatal Vit-Fe Fumarate-FA (PRENATAL MULTIVITAMIN) TABS tablet Take 1 tablet by mouth daily. (Patient not taking: Reported on 12/07/2018) 30 tablet 1   . valACYclovir (VALTREX) 1000 MG tablet Take 1 tablet (1,000 mg total) by mouth 3 (three) times daily. (Patient not taking: Reported on 12/07/2018) 21 tablet 0     Review of Systems  Constitutional: Negative for chills and fever.  Gastrointestinal: Positive for nausea and vomiting. Negative for constipation and diarrhea.  Genitourinary: Negative for dysuria, frequency, pelvic pain, vaginal bleeding and vaginal discharge.   Physical Exam   Blood pressure 110/78, pulse 74, temperature 98.8 F (37.1 C), resp. rate 17, weight 59.6 kg, last menstrual period 03/10/2019, SpO2 100 %, unknown if currently breastfeeding.  Physical Exam  Nursing note and vitals reviewed. Constitutional: She is oriented to person, place, and time. She appears well-developed and well-nourished. No distress.  HENT:  Head: Normocephalic.  Right Ear: Tympanic membrane, external ear and ear canal normal.  Left Ear: Tympanic membrane, external ear and ear canal normal.  Cardiovascular: Normal rate.  Respiratory: Effort normal.  GI: Soft. There is no abdominal tenderness. There is no rebound.  Neurological: She is alert and oriented to person, place, and time.  Skin: Skin is warm and dry.  Psychiatric: She has a normal mood and affect.   Results for orders placed or performed during the hospital encounter of 05/01/19 (from the past 24 hour(s))  Urinalysis, Routine w reflex microscopic     Status: Abnormal   Collection Time: 05/01/19  7:55 PM  Result Value Ref Range   Color, Urine YELLOW YELLOW   APPearance HAZY (A) CLEAR   Specific Gravity, Urine 1.030 1.005 - 1.030   pH 6.0 5.0 -  8.0   Glucose, UA NEGATIVE NEGATIVE mg/dL   Hgb urine dipstick NEGATIVE NEGATIVE   Bilirubin Urine NEGATIVE NEGATIVE   Ketones, ur 80 (A) NEGATIVE mg/dL   Protein, ur 30 (A) NEGATIVE mg/dL   Nitrite NEGATIVE NEGATIVE   Leukocytes,Ua NEGATIVE NEGATIVE   RBC / HPF 0-5 0 - 5 RBC/hpf  WBC, UA 0-5 0 - 5 WBC/hpf   Bacteria, UA NONE SEEN NONE SEEN   Squamous Epithelial / LPF 11-20 0 - 5   Mucus PRESENT   CBC with Differential/Platelet     Status: None   Collection Time: 05/01/19  8:47 PM  Result Value Ref Range   WBC 5.5 4.0 - 10.5 K/uL   RBC 4.31 3.87 - 5.11 MIL/uL   Hemoglobin 14.5 12.0 - 15.0 g/dL   HCT 37.1 06.2 - 69.4 %   MCV 95.8 80.0 - 100.0 fL   MCH 33.6 26.0 - 34.0 pg   MCHC 35.1 30.0 - 36.0 g/dL   RDW 85.4 62.7 - 03.5 %   Platelets 248 150 - 400 K/uL   nRBC 0.0 0.0 - 0.2 %   Neutrophils Relative % 58 %   Neutro Abs 3.2 1.7 - 7.7 K/uL   Lymphocytes Relative 30 %   Lymphs Abs 1.7 0.7 - 4.0 K/uL   Monocytes Relative 11 %   Monocytes Absolute 0.6 0.1 - 1.0 K/uL   Eosinophils Relative 1 %   Eosinophils Absolute 0.0 0.0 - 0.5 K/uL   Basophils Relative 0 %   Basophils Absolute 0.0 0.0 - 0.1 K/uL   Immature Granulocytes 0 %   Abs Immature Granulocytes 0.02 0.00 - 0.07 K/uL  Comprehensive metabolic panel     Status: Abnormal   Collection Time: 05/01/19  8:47 PM  Result Value Ref Range   Sodium 134 (L) 135 - 145 mmol/L   Potassium 3.4 (L) 3.5 - 5.1 mmol/L   Chloride 100 98 - 111 mmol/L   CO2 24 22 - 32 mmol/L   Glucose, Bld 81 70 - 99 mg/dL   BUN 7 6 - 20 mg/dL   Creatinine, Ser 0.09 0.44 - 1.00 mg/dL   Calcium 9.5 8.9 - 38.1 mg/dL   Total Protein 7.6 6.5 - 8.1 g/dL   Albumin 4.2 3.5 - 5.0 g/dL   AST 13 (L) 15 - 41 U/L   ALT 22 0 - 44 U/L   Alkaline Phosphatase 26 (L) 38 - 126 U/L   Total Bilirubin 1.1 0.3 - 1.2 mg/dL   GFR calc non Af Amer >60 >60 mL/min   GFR calc Af Amer >60 >60 mL/min   Anion gap 10 5 - 15     MAU Course  Procedures  MDM DW patient  options including trying medrol taper. She states that with her last pregnancy she thinks she had steroids and that they helped a lot.   On review of the chart she did have medrol taper in 2016. She is agreeable to trying this at this time.   Patient has had 1L of banana bag, 1/2 L of LR, solumedrol, pepcid and reglan. She is feeling better at this time. She is tolerating PO now.   Assessment and Plan   1. Nausea/vomiting in pregnancy   2. Hyperemesis   3. [redacted] weeks gestation of pregnancy    DC home Comfort measures reviewed  1st Trimester precautions  RX: medrol taper for hyperemsis as directed, reglan PRN #30 with 3RF, Phenergan suppositories PRN #12 with 3RF  Return to MAU as needed FU with OB as planned  Follow-up Information    [facility] Follow up.   Specialty: Obstetrics and Gynecology Contact information: [address] [place] [address], [address] [id number] mc [place] [id number] [phone]         Thressa Sheller DNP, CNM  05/01/19  9:52  PM    

## 2019-05-01 NOTE — MAU Note (Signed)
N/V that has been a continued problem- unable to keep down her meds.  After arriving here started feeling popping/ringing in her right ear and noticing pain.  Also reports acid reflux.  No VB/discharge.

## 2019-05-01 NOTE — Discharge Instructions (Signed)
Hyperemesis Gravidarum Hyperemesis gravidarum is a severe form of nausea and vomiting that happens during pregnancy. Hyperemesis is worse than morning sickness. It may cause you to have nausea or vomiting all day for many days. It may keep you from eating and drinking enough food and liquids, which can lead to dehydration, malnutrition, and weight loss. Hyperemesis usually occurs during the first half (the first 20 weeks) of pregnancy. It often goes away once a woman is in her second half of pregnancy. However, sometimes hyperemesis continues through an entire pregnancy. What are the causes? The cause of this condition is not known. It may be related to changes in chemicals (hormones) in the body during pregnancy, such as the high level of pregnancy hormone (human chorionic gonadotropin) or the increase in the female sex hormone (estrogen). What are the signs or symptoms? Symptoms of this condition include:  Nausea that does not go away.  Vomiting that does not allow you to keep any food down.  Weight loss.  Body fluid loss (dehydration).  Having no desire to eat, or not liking food that you have previously enjoyed. How is this diagnosed? This condition may be diagnosed based on:  A physical exam.  Your medical history.  Your symptoms.  Blood tests.  Urine tests. How is this treated? This condition is managed by controlling symptoms. This may include:  Following an eating plan. This can help lessen nausea and vomiting.  Taking prescription medicines. An eating plan and medicines are often used together to help control symptoms. If medicines do not help relieve nausea and vomiting, you may need to receive fluids through an IV at the hospital. Follow these instructions at home: Eating and drinking   Avoid the following: ? Drinking fluids with meals. Try not to drink anything during the 30 minutes before and after your meals. ? Drinking more than 1 cup of fluid at a  time. ? Eating foods that trigger your symptoms. These may include spicy foods, coffee, high-fat foods, very sweet foods, and acidic foods. ? Skipping meals. Nausea can be more intense on an empty stomach. If you cannot tolerate food, do not force it. Try sucking on ice chips or other frozen items and make up for missed calories later. ? Lying down within 2 hours after eating. ? Being exposed to environmental triggers. These may include food smells, smoky rooms, closed spaces, rooms with strong smells, warm or humid places, overly loud and noisy rooms, and rooms with motion or flickering lights. Try eating meals in a well-ventilated area that is free of strong smells. ? Quick and sudden changes in your movement. ? Taking iron pills and multivitamins that contain iron. If you take prescription iron pills, do not stop taking them unless your health care provider approves. ? Preparing food. The smell of food can spoil your appetite or trigger nausea.  To help relieve your symptoms: ? Listen to your body. Everyone is different and has different preferences. Find what works best for you. ? Eat and drink slowly. ? Eat 5-6 small meals daily instead of 3 large meals. Eating small meals and snacks can help you avoid an empty stomach. ? In the morning, before getting out of bed, eat a couple of crackers to avoid moving around on an empty stomach. ? Try eating starchy foods as these are usually tolerated well. Examples include cereal, toast, bread, potatoes, pasta, rice, and pretzels. ? Include at least 1 serving of protein with your meals and snacks. Protein options include   lean meats, poultry, seafood, beans, nuts, nut butters, eggs, cheese, and yogurt. ? Try eating a protein-rich snack before bed. Examples of a protein-rick snack include cheese and crackers or a peanut butter sandwich made with 1 slice of whole-wheat bread and 1 tsp (5 g) of peanut butter. ? Eat or suck on things that have ginger in them.  It may help relieve nausea. Add  tsp ground ginger to hot tea or choose ginger tea. ? Try drinking 100% fruit juice or an electrolyte drink. An electrolyte drink contains sodium, potassium, and chloride. ? Drink fluids that are cold, clear, and carbonated or sour. Examples include lemonade, ginger ale, lemon-lime soda, ice water, and sparkling water. ? Brush your teeth or use a mouth rinse after meals. ? Talk with your health care provider about starting a supplement of vitamin B6. General instructions  Take over-the-counter and prescription medicines only as told by your health care provider.  Follow instructions from your health care provider about eating or drinking restrictions.  Continue to take your prenatal vitamins as told by your health care provider. If you are having trouble taking your prenatal vitamins, talk with your health care provider about different options.  Keep all follow-up and pre-birth (prenatal) visits as told by your health care provider. This is important. Contact a health care provider if:  You have pain in your abdomen.  You have a severe headache.  You have vision problems.  You are losing weight.  You feel weak or dizzy. Get help right away if:  You cannot drink fluids without vomiting.  You vomit blood.  You have constant nausea and vomiting.  You are very weak.  You faint.  You have a fever and your symptoms suddenly get worse. Summary  Hyperemesis gravidarum is a severe form of nausea and vomiting that happens during pregnancy.  Making some changes to your eating habits may help relieve nausea and vomiting.  This condition may be managed with medicine.  If medicines do not help relieve nausea and vomiting, you may need to receive fluids through an IV at the hospital. This information is not intended to replace advice given to you by your health care provider. Make sure you discuss any questions you have with your health care  provider. Document Revised: 03/14/2017 Document Reviewed: 10/22/2015 [employer] Patient Education  2020 [employer]  

## 2019-05-15 ENCOUNTER — Inpatient Hospital Stay (HOSPITAL_COMMUNITY): Payer: Medicaid Other

## 2019-05-15 ENCOUNTER — Encounter (HOSPITAL_COMMUNITY): Payer: Self-pay | Admitting: Obstetrics and Gynecology

## 2019-05-15 ENCOUNTER — Other Ambulatory Visit: Payer: Self-pay

## 2019-05-15 ENCOUNTER — Inpatient Hospital Stay (HOSPITAL_COMMUNITY)
Admission: AD | Admit: 2019-05-15 | Discharge: 2019-05-15 | Disposition: A | Discharge status: Home / Self Care | Payer: Medicaid Other | Attending: Obstetrics and Gynecology | Admitting: Obstetrics and Gynecology

## 2019-05-15 DIAGNOSIS — O210 Mild hyperemesis gravidarum: Secondary | ICD-10-CM | POA: Insufficient documentation

## 2019-05-15 DIAGNOSIS — G44201 Tension-type headache, unspecified, intractable: Secondary | ICD-10-CM | POA: Diagnosis not present

## 2019-05-15 DIAGNOSIS — R109 Unspecified abdominal pain: Secondary | ICD-10-CM | POA: Insufficient documentation

## 2019-05-15 DIAGNOSIS — O99351 Diseases of the nervous system complicating pregnancy, first trimester: Secondary | ICD-10-CM | POA: Insufficient documentation

## 2019-05-15 DIAGNOSIS — O219 Vomiting of pregnancy, unspecified: Secondary | ICD-10-CM

## 2019-05-15 DIAGNOSIS — Z87891 Personal history of nicotine dependence: Secondary | ICD-10-CM | POA: Diagnosis not present

## 2019-05-15 DIAGNOSIS — Z3A09 9 weeks gestation of pregnancy: Secondary | ICD-10-CM | POA: Insufficient documentation

## 2019-05-15 DIAGNOSIS — O26891 Other specified pregnancy related conditions, first trimester: Secondary | ICD-10-CM | POA: Insufficient documentation

## 2019-05-15 DIAGNOSIS — G44209 Tension-type headache, unspecified, not intractable: Secondary | ICD-10-CM | POA: Insufficient documentation

## 2019-05-15 LAB — COMPREHENSIVE METABOLIC PANEL
ALT: 66 U/L — ABNORMAL HIGH (ref 0–44)
AST: 26 U/L (ref 15–41)
Albumin: 3.6 g/dL (ref 3.5–5.0)
Alkaline Phosphatase: 35 U/L — ABNORMAL LOW (ref 38–126)
Anion gap: 8 (ref 5–15)
BUN: <5 mg/dL — ABNORMAL LOW (ref 6–20)
CO2: 25 mmol/L (ref 22–32)
Calcium: 9.2 mg/dL (ref 8.9–10.3)
Chloride: 102 mmol/L (ref 98–111)
Creatinine, Ser: 0.5 mg/dL (ref 0.44–1.00)
GFR calc Af Amer: >60 mL/min (ref >60)
GFR calc non Af Amer: >60 mL/min (ref >60)
Glucose, Bld: 89 mg/dL (ref 70–99)
Potassium: 4 mmol/L (ref 3.5–5.1)
Sodium: 135 mmol/L (ref 135–145)
Total Bilirubin: 0.6 mg/dL (ref 0.3–1.2)
Total Protein: 6.6 g/dL (ref 6.5–8.1)

## 2019-05-15 LAB — CBC WITH DIFFERENTIAL/PLATELET
Abs Immature Granulocytes: 0.05 10*3/uL (ref 0.00–0.07)
Basophils Absolute: 0 10*3/uL (ref 0.0–0.1)
Basophils Relative: 0 %
Eosinophils Absolute: 0 10*3/uL (ref 0.0–0.5)
Eosinophils Relative: 0 %
HCT: 40.2 % (ref 36.0–46.0)
Hemoglobin: 13.9 g/dL (ref 12.0–15.0)
Immature Granulocytes: 1 %
Lymphocytes Relative: 17 %
Lymphs Abs: 1.2 10*3/uL (ref 0.7–4.0)
MCH: 33.8 pg (ref 26.0–34.0)
MCHC: 34.6 g/dL (ref 30.0–36.0)
MCV: 97.8 fL (ref 80.0–100.0)
Monocytes Absolute: 0.5 10*3/uL (ref 0.1–1.0)
Monocytes Relative: 8 %
Neutro Abs: 5.3 10*3/uL (ref 1.7–7.7)
Neutrophils Relative %: 74 %
Platelets: 193 10*3/uL (ref 150–400)
RBC: 4.11 MIL/uL (ref 3.87–5.11)
RDW: 14.6 % (ref 11.5–15.5)
WBC: 7.1 10*3/uL (ref 4.0–10.5)
nRBC: 0 % (ref 0.0–0.2)

## 2019-05-15 LAB — URINALYSIS, ROUTINE W REFLEX MICROSCOPIC
Bilirubin Urine: NEGATIVE
Glucose, UA: NEGATIVE mg/dL
Hgb urine dipstick: NEGATIVE
Ketones, ur: NEGATIVE mg/dL
Leukocytes,Ua: NEGATIVE
Nitrite: NEGATIVE
Protein, ur: NEGATIVE mg/dL
Specific Gravity, Urine: 1.012 (ref 1.005–1.030)
pH: 9 — ABNORMAL HIGH (ref 5.0–8.0)

## 2019-05-15 MED ORDER — GLYCOPYRROLATE 1 MG PO TABS: 1.0000 mg | ORAL_TABLET | Freq: Three times a day (TID) | ORAL | 0 refills | Status: DC | PRN

## 2019-05-15 MED ORDER — PROCHLORPERAZINE MALEATE 10 MG PO TABS: 10.0000 mg | ORAL_TABLET | Freq: Two times a day (BID) | ORAL | 0 refills | Status: DC | PRN

## 2019-05-15 MED ORDER — LACTATED RINGERS IV BOLUS
1000.0000 mL | Freq: Once | INTRAVENOUS | Status: AC
  Administered 2019-05-15: 1000 mL via INTRAVENOUS

## 2019-05-15 MED ORDER — DIPHENHYDRAMINE HCL 50 MG/ML IJ SOLN
25.0000 mg | Freq: Once | INTRAMUSCULAR | Status: AC
  Administered 2019-05-15: 25 mg via INTRAVENOUS
  Filled 2019-05-15: qty 1

## 2019-05-15 MED ORDER — FAMOTIDINE IN NACL 20-0.9 MG/50ML-% IV SOLN: 20.0000 mg | Freq: Once | INTRAVENOUS | Status: DC

## 2019-05-15 MED ORDER — BUTALBITAL-APAP-CAFFEINE 50-325-40 MG PO TABS
2.0000 | ORAL_TABLET | Freq: Once | ORAL | Status: AC
  Administered 2019-05-15: 2 via ORAL
  Filled 2019-05-15: qty 2

## 2019-05-15 MED ORDER — ONDANSETRON HCL 4 MG/2ML IJ SOLN: 4.0000 mg | Freq: Once | INTRAMUSCULAR | Status: DC

## 2019-05-15 MED ORDER — GLYCOPYRROLATE 0.2 MG/ML IJ SOLN
0.2000 mg | Freq: Once | INTRAMUSCULAR | Status: AC
  Administered 2019-05-15: 0.2 mg via INTRAVENOUS
  Filled 2019-05-15: qty 1

## 2019-05-15 MED ORDER — DEXAMETHASONE SODIUM PHOSPHATE 10 MG/ML IJ SOLN
10.0000 mg | Freq: Once | INTRAMUSCULAR | Status: AC
  Administered 2019-05-15: 10 mg via INTRAVENOUS
  Filled 2019-05-15 (×2): qty 1

## 2019-05-15 MED ORDER — FAMOTIDINE IN NACL 20-0.9 MG/50ML-% IV SOLN
20.0000 mg | Freq: Once | INTRAVENOUS | Status: AC
  Administered 2019-05-15: 20 mg via INTRAVENOUS
  Filled 2019-05-15: qty 50

## 2019-05-15 MED ORDER — PROCHLORPERAZINE EDISYLATE 10 MG/2ML IJ SOLN
10.0000 mg | Freq: Once | INTRAMUSCULAR | Status: AC
  Administered 2019-05-15: 10 mg via INTRAVENOUS
  Filled 2019-05-15: qty 2

## 2019-05-15 MED ORDER — ONDANSETRON HCL 4 MG/2ML IJ SOLN
4.0000 mg | Freq: Once | INTRAMUSCULAR | Status: AC
  Administered 2019-05-15: 4 mg via INTRAVENOUS
  Filled 2019-05-15: qty 2

## 2019-05-15 NOTE — MAU Provider Note (Signed)
History     CSN: [id number]  Arrival date and time: 05/15/19 1245   First Provider Initiated Contact with Patient 05/15/19 1345      Chief Complaint  Patient presents with  . Nausea  . Emesis  . Headache  . Abdominal Pain   Brandy Ferguson is a 34 y.o. G3P2 at [redacted]w[redacted]d by LMP who presents to MAU with complaints of n/v, abdominal pain and headache. She has been seen multiple times for nausea/vomiting during this pregnancy. Patient reports that she completed her medrol taper over the past 2 days. Then symptoms immediately returned. She reports having zofran, reglan and phenergan at home without relief. Patient reports HA started occurring yesterday afternoon, rates pain 10/10- has taken tylenol without relief. She reports hx of HA's and migraines prior to pregnancy. Patient reports lower abdominal pain that is specific to left side that started today. Describes the pain as cramping that is sometimes sharp and stabbing. Rates pain 5/10. She plans on receiving prenatal care at [facility] but reports she does not have an appointment yet.    OB History    Gravida  3   Para  2   Term  2   Preterm      AB      Living  2     SAB      TAB      Ectopic      Multiple  0   Live Births  2           Past Medical History:  Diagnosis Date  . Anemia   . DOE (dyspnea on exertion)   . GERD (gastroesophageal reflux disease)    with pregnancy  . Rapid heart beat     Past Surgical History:  Procedure Laterality Date  . APPENDECTOMY    . CESAREAN SECTION    . CESAREAN SECTION N/A 03/06/2015   Procedure: CESAREAN SECTION;  Surgeon: Carrington Clamp, MD;  Location: [facility];  Service: Obstetrics;  Laterality: N/A;  EDD: 03/10/15  . WISDOM TOOTH EXTRACTION      Family History  Problem Relation Age of Onset  . Hypertension Mother   . Kidney disease Paternal Grandmother   . Alcohol abuse Neg Hx   . Arthritis Neg Hx   . Asthma Neg Hx   . Birth defects Neg Hx   . Cancer Neg Hx    . COPD Neg Hx   . Depression Neg Hx   . Diabetes Neg Hx   . Drug abuse Neg Hx   . Early death Neg Hx   . Hearing loss Neg Hx   . Heart disease Neg Hx   . Hyperlipidemia Neg Hx   . Learning disabilities Neg Hx   . Mental illness Neg Hx   . Mental retardation Neg Hx   . Miscarriages / Stillbirths Neg Hx   . Stroke Neg Hx   . Vision loss Neg Hx   . Varicose Veins Neg Hx     Social History   Tobacco Use  . Smoking status: Former Smoker    Packs/day: 0.50    Years: 7.00    Pack years: 3.50    Types: Cigarettes    Quit date: 03/08/2014    Years since quitting: 5.1  . Smokeless tobacco: Never Used  Substance Use Topics  . Alcohol use: Not Currently    Comment: occ  . Drug use: No    Allergies:  Allergies  Allergen Reactions  . Bactrim [Sulfamethoxazole-Trimethoprim] Hives  .  Flagyl [Metronidazole] Hives  . Lubricants Hives    KY jelly causes itching and hives  . Penicillins Other (See Comments)    Has patient had a PCN reaction causing immediate rash, facial/tongue/throat swelling, SOB or lightheadedness with hypotension: Yes Has patient had a PCN reaction causing severe rash involving mucus membranes or skin necrosis: No Has patient had a PCN reaction that required hospitalization Yes Has patient had a PCN reaction occurring within the last 10 years: No If all of the above answers are "NO", then may proceed with Cephalosporin use. Pt reports taking Amoxicillin without any adverse effects    Medications Prior to Admission  Medication Sig Dispense Refill Last Dose  . glycopyrrolate (ROBINUL) 1 MG tablet Take 1 tablet (1 mg total) by mouth every 8 (eight) hours as needed (excessive saliva). 30 tablet 0 05/15/2019 at Unknown time  . methylPREDNISolone (MEDROL) 4 MG tablet Day 1, 2 & 3: 4 tabs TID. Day 4 &5: 4 tabs am & pm, 2 tab midday. Day 6: 2 tab TID. Day 7: 2 tab am & pm, 1 tab midday, Day 8: 2 tab am, 1 tab midday and pm. Day 9 &10 : 2 tab am, 1 tab midday. Day 11& 12: 2  tab Q am. Day 13 & 14: 1 tab Q am 80 tablet 0 Past Week at Unknown time  . metoCLOPramide (REGLAN) 10 MG tablet Take 1 tablet (10 mg total) by mouth every 6 (six) hours. 30 tablet 3 05/15/2019 at Unknown time  . ondansetron (ZOFRAN ODT) 4 MG disintegrating tablet Take 1 tablet (4 mg total) by mouth every 6 (six) hours as needed for nausea. 20 tablet 0 05/14/2019 at Unknown time  . pantoprazole (PROTONIX) 20 MG tablet Take 1 tablet (20 mg total) by mouth daily. 30 tablet 0 Past Week at Unknown time  . promethazine (PHENERGAN) 25 MG suppository Place 1 suppository (25 mg total) rectally every 6 (six) hours as needed for nausea or vomiting. 12 each 3 05/15/2019 at Unknown time  . ferrous sulfate 325 (65 FE) MG tablet Take 1 tablet (325 mg total) by mouth daily with breakfast. (Patient not taking: Reported on 12/07/2018) 30 tablet 3 Unknown at Unknown time  . Prenatal Vit-Fe Fumarate-FA (PRENATAL MULTIVITAMIN) TABS tablet Take 1 tablet by mouth daily. (Patient not taking: Reported on 12/07/2018) 30 tablet 1 Unknown at Unknown time  . valACYclovir (VALTREX) 1000 MG tablet Take 1 tablet (1,000 mg total) by mouth 3 (three) times daily. (Patient not taking: Reported on 12/07/2018) 21 tablet 0 More than a month at Unknown time    Review of Systems  Constitutional: Negative.   Respiratory: Negative.   Cardiovascular: Negative.   Gastrointestinal: Positive for abdominal pain, nausea and vomiting. Negative for constipation and diarrhea.  Genitourinary: Negative for difficulty urinating, dysuria, frequency, pelvic pain, urgency, vaginal bleeding and vaginal discharge.  Musculoskeletal: Negative.   Neurological: Positive for headaches. Negative for dizziness and weakness.   Physical Exam   Blood pressure 120/78, pulse 79, temperature 97.6 F (36.4 C), temperature source Oral, resp. rate 16, last menstrual period 03/10/2019, SpO2 100 %, unknown if currently breastfeeding.  Physical Exam  Nursing note and vitals  reviewed. Constitutional: She is oriented to person, place, and time. She appears well-developed and well-nourished. No distress.  Cardiovascular: Normal rate and regular rhythm.  Respiratory: Effort normal and breath sounds normal. No respiratory distress. She has no wheezes.  GI: Soft. She exhibits no distension. There is abdominal tenderness. There is no rebound and  no guarding.  Tenderness with palpation on LLQ   Musculoskeletal:        General: No edema. Normal range of motion.  Neurological: She is alert and oriented to person, place, and time.  Psychiatric: She has a normal mood and affect. Her behavior is normal. Thought content normal.   MAU Course  Procedures  MDM Orders Placed This Encounter  Procedures  . Urinalysis, Routine w reflex microscopic  . CBC with Differential/Platelet  . Comprehensive metabolic panel  . Insert peripheral IV   Meds ordered this encounter  Medications  . AND Linked Order Group   . diphenhydrAMINE (BENADRYL) injection 25 mg   . dexamethasone (DECADRON) injection 10 mg  . prochlorperazine (COMPAZINE) injection 10 mg  . lactated ringers bolus 1,000 mL  . DISCONTD: famotidine (PEPCID) IVPB 20 mg premix  . famotidine (PEPCID) IVPB 20 mg premix  . glycopyrrolate (ROBINUL) injection 0.2 mg  . butalbital-acetaminophen-caffeine (FIORICET) 50-325-40 MG per tablet 2 tablet  . DISCONTD: ondansetron (ZOFRAN) injection 4 mg  . ondansetron (ZOFRAN) injection 4 mg   Treatments in MAU including HA cocktail- compazine in replace of reglan, as patient states "reglan doesn't help nausea or vomiting". After HA cocktail then pepcid and robinul for LUQ pain and ptyalism.   Labs results reviewed:  Results for orders placed or performed during the hospital encounter of 05/15/19 (from the past 24 hour(s))  Urinalysis, Routine w reflex microscopic     Status: Abnormal   Collection Time: 05/15/19  1:13 PM  Result Value Ref Range   Color, Urine YELLOW YELLOW    APPearance HAZY (A) CLEAR   Specific Gravity, Urine 1.012 1.005 - 1.030   pH 9.0 (H) 5.0 - 8.0   Glucose, UA NEGATIVE NEGATIVE mg/dL   Hgb urine dipstick NEGATIVE NEGATIVE   Bilirubin Urine NEGATIVE NEGATIVE   Ketones, ur NEGATIVE NEGATIVE mg/dL   Protein, ur NEGATIVE NEGATIVE mg/dL   Nitrite NEGATIVE NEGATIVE   Leukocytes,Ua NEGATIVE NEGATIVE  CBC with Differential/Platelet     Status: None   Collection Time: 05/15/19  2:43 PM  Result Value Ref Range   WBC 7.1 4.0 - 10.5 K/uL   RBC 4.11 3.87 - 5.11 MIL/uL   Hemoglobin 13.9 12.0 - 15.0 g/dL   HCT 33.3 83.2 - 91.9 %   MCV 97.8 80.0 - 100.0 fL   MCH 33.8 26.0 - 34.0 pg   MCHC 34.6 30.0 - 36.0 g/dL   RDW 16.6 06.0 - 04.5 %   Platelets 193 150 - 400 K/uL   nRBC 0.0 0.0 - 0.2 %   Neutrophils Relative % 74 %   Neutro Abs 5.3 1.7 - 7.7 K/uL   Lymphocytes Relative 17 %   Lymphs Abs 1.2 0.7 - 4.0 K/uL   Monocytes Relative 8 %   Monocytes Absolute 0.5 0.1 - 1.0 K/uL   Eosinophils Relative 0 %   Eosinophils Absolute 0.0 0.0 - 0.5 K/uL   Basophils Relative 0 %   Basophils Absolute 0.0 0.0 - 0.1 K/uL   Immature Granulocytes 1 %   Abs Immature Granulocytes 0.05 0.00 - 0.07 K/uL  Comprehensive metabolic panel     Status: Abnormal   Collection Time: 05/15/19  2:43 PM  Result Value Ref Range   Sodium 135 135 - 145 mmol/L   Potassium 4.0 3.5 - 5.1 mmol/L   Chloride 102 98 - 111 mmol/L   CO2 25 22 - 32 mmol/L   Glucose, Bld 89 70 - 99 mg/dL   BUN <  5 (L) 6 - 20 mg/dL   Creatinine, Ser 2.11 0.44 - 1.00 mg/dL   Calcium 9.2 8.9 - 94.1 mg/dL   Total Protein 6.6 6.5 - 8.1 g/dL   Albumin 3.6 3.5 - 5.0 g/dL   AST 26 15 - 41 U/L   ALT 66 (H) 0 - 44 U/L   Alkaline Phosphatase 35 (L) 38 - 126 U/L   Total Bilirubin 0.6 0.3 - 1.2 mg/dL   GFR calc non Af Amer >60 >60 mL/min   GFR calc Af Amer >60 >60 mL/min   Anion gap 8 5 - 15    Reassessment @ 1600: patient reports HA is down to 7/10, patient reports she has not vomited but is continue to  dry heave. Discussed option of fioricet for HA, discussed with patient this is oral medication and can premedicate patient with zofran prior to oral medication. Patient agreeable to plan or care and "wants to try anything to get this headache gone".   Reassessment @ 1700: patient reports HA down to 3-4/10. Denies nausea or vomiting at this time. [place] ordered for assessment of abdominal pain.   [place] report reviewed: US OB Comp Less 14 Wks  Result Date: 05/15/2019 CLINICAL DATA:  Left upper quadrant pain EXAM: OBSTETRIC <14 WK ULTRASOUND TECHNIQUE: Transabdominal ultrasound was performed for evaluation of the gestation as well as the maternal uterus and adnexal regions. COMPARISON:  None. FINDINGS: Intrauterine gestational sac: Single Yolk sac:  Visualized. Embryo:  Visualized. Cardiac Activity: Visualized. Heart Rate: 171 bpm CRL: 22.1 mm   8 w 6 d                  [place] EDC: 12/19/2019 Subchorionic hemorrhage:  None visualized. Maternal uterus/adnexae: Ovaries are within normal limits. Right ovary measures 4.3 x 2.5 x 3.5 cm. The left ovary measures 3.6 x 2.3 x 2 cm. No significant free fluid. IMPRESSION: Single viable intrauterine pregnancy as above. No specific abnormalities are seen. Electronically Signed   By: Jasmine Pang M.D.   On: 05/15/2019 18:06   Discussed results of [place] with patient. Patient reports HA has resolved and nausea/vomiting. Patient reports abdominal pain has decreased since returning from [place].   Discussed changes in medication with addition of compazine, robinul with zofran PRN. Discussed with patient to stop reglan and phenergan, patient verbalizes understanding. Rx for compazine sent to pharmacy of choice. Discussed reasons to return to MAU. Make initial prenatal appointment. Return to MAU as needed. Pt stable at time of discharge.   Assessment and Plan   1. Nausea and vomiting during pregnancy   2. Abdominal pain during pregnancy in first trimester   3. Acute intractable  tension-type headache   4. [redacted] weeks gestation of pregnancy    Discharge home Make initial prenatal appointment  Rx for compazine  Return to MAU as needed for reasons discussed and/or emergencies   Follow-up Information    [facility]. Schedule an appointment as soon as possible for a visit.   Why: Make appointment for initial prenatal appointment  Contact information: [address]  [address] [place] [address] [phone]          Allergies as of 05/15/2019      Reactions   Bactrim [sulfamethoxazole-trimethoprim] Hives   Flagyl [metronidazole] Hives   Lubricants Hives   KY jelly causes itching and hives   Penicillins Other (See Comments)   Has patient had a PCN reaction causing immediate rash, facial/tongue/throat swelling, SOB or lightheadedness with  hypotension: Yes Has patient had a PCN reaction causing severe rash involving mucus membranes or skin necrosis: No Has patient had a PCN reaction that required hospitalization Yes Has patient had a PCN reaction occurring within the last 10 years: No If all of the above answers are "NO", then may proceed with Cephalosporin use. Pt reports taking Amoxicillin without any adverse effects      Medication List    STOP taking these medications   methylPREDNISolone 4 MG tablet Commonly known as: MEDROL   metoCLOPramide 10 MG tablet Commonly known as: REGLAN   promethazine 25 MG suppository Commonly known as: PHENERGAN     TAKE these medications   ferrous sulfate 325 (65 FE) MG tablet Take 1 tablet (325 mg total) by mouth daily with breakfast.   glycopyrrolate 1 MG tablet Commonly known as: Robinul Take 1 tablet (1 mg total) by mouth every 8 (eight) hours as needed (excessive saliva).   ondansetron 4 MG disintegrating tablet Commonly known as: Zofran ODT Take 1 tablet (4 mg total) by mouth every 6 (six) hours as needed for nausea.   pantoprazole 20 MG tablet Commonly known as: Protonix Take 1  tablet (20 mg total) by mouth daily.   prenatal multivitamin Tabs tablet Take 1 tablet by mouth daily.   prochlorperazine 10 MG tablet Commonly known as: COMPAZINE Take 1 tablet (10 mg total) by mouth 2 (two) times daily as needed for nausea or vomiting.   valACYclovir 1000 MG tablet Commonly known as: VALTREX Take 1 tablet (1,000 mg total) by mouth 3 (three) times daily.       Sharyon CableVeronica C Makeshia Seat CNM 05/15/2019, 7:06 PM

## 2019-05-15 NOTE — MAU Note (Signed)
On going nausea and vomiting.  Has a bad headache, is light headed. Having pain in LUQ, started yesterday.

## 2019-05-29 ENCOUNTER — Encounter (HOSPITAL_COMMUNITY): Payer: Self-pay | Admitting: Obstetrics and Gynecology

## 2019-05-29 ENCOUNTER — Inpatient Hospital Stay (HOSPITAL_COMMUNITY): Payer: Medicaid Other

## 2019-05-29 ENCOUNTER — Inpatient Hospital Stay (HOSPITAL_COMMUNITY)
Admission: AD | Admit: 2019-05-29 | Discharge: 2019-05-30 | Disposition: A | Discharge status: Home / Self Care | Payer: Medicaid Other | Attending: Obstetrics and Gynecology | Admitting: Obstetrics and Gynecology

## 2019-05-29 ENCOUNTER — Other Ambulatory Visit: Payer: Self-pay

## 2019-05-29 DIAGNOSIS — Z880 Allergy status to penicillin: Secondary | ICD-10-CM | POA: Diagnosis not present

## 2019-05-29 DIAGNOSIS — O09522 Supervision of elderly multigravida, second trimester: Secondary | ICD-10-CM | POA: Insufficient documentation

## 2019-05-29 DIAGNOSIS — K219 Gastro-esophageal reflux disease without esophagitis: Secondary | ICD-10-CM | POA: Insufficient documentation

## 2019-05-29 DIAGNOSIS — N8311 Corpus luteum cyst of right ovary: Secondary | ICD-10-CM

## 2019-05-29 DIAGNOSIS — Z87891 Personal history of nicotine dependence: Secondary | ICD-10-CM | POA: Diagnosis not present

## 2019-05-29 DIAGNOSIS — O99611 Diseases of the digestive system complicating pregnancy, first trimester: Secondary | ICD-10-CM | POA: Diagnosis not present

## 2019-05-29 DIAGNOSIS — R1031 Right lower quadrant pain: Secondary | ICD-10-CM | POA: Diagnosis present

## 2019-05-29 DIAGNOSIS — O219 Vomiting of pregnancy, unspecified: Secondary | ICD-10-CM

## 2019-05-29 DIAGNOSIS — Z3A11 11 weeks gestation of pregnancy: Secondary | ICD-10-CM | POA: Diagnosis not present

## 2019-05-29 DIAGNOSIS — O26891 Other specified pregnancy related conditions, first trimester: Secondary | ICD-10-CM | POA: Diagnosis not present

## 2019-05-29 DIAGNOSIS — O210 Mild hyperemesis gravidarum: Secondary | ICD-10-CM | POA: Diagnosis not present

## 2019-05-29 DIAGNOSIS — Z881 Allergy status to other antibiotic agents status: Secondary | ICD-10-CM | POA: Diagnosis not present

## 2019-05-29 LAB — CBC WITH DIFFERENTIAL/PLATELET
Abs Immature Granulocytes: 0.02 10*3/uL (ref 0.00–0.07)
Basophils Absolute: 0 10*3/uL (ref 0.0–0.1)
Basophils Relative: 1 %
Eosinophils Absolute: 0.2 10*3/uL (ref 0.0–0.5)
Eosinophils Relative: 3 %
HCT: 36.6 % (ref 36.0–46.0)
Hemoglobin: 12.7 g/dL (ref 12.0–15.0)
Immature Granulocytes: 0 %
Lymphocytes Relative: 29 %
Lymphs Abs: 1.8 10*3/uL (ref 0.7–4.0)
MCH: 33.2 pg (ref 26.0–34.0)
MCHC: 34.7 g/dL (ref 30.0–36.0)
MCV: 95.8 fL (ref 80.0–100.0)
Monocytes Absolute: 0.5 10*3/uL (ref 0.1–1.0)
Monocytes Relative: 8 %
Neutro Abs: 3.7 10*3/uL (ref 1.7–7.7)
Neutrophils Relative %: 59 %
Platelets: 195 10*3/uL (ref 150–400)
RBC: 3.82 MIL/uL — ABNORMAL LOW (ref 3.87–5.11)
RDW: 14.1 % (ref 11.5–15.5)
WBC: 6.1 10*3/uL (ref 4.0–10.5)
nRBC: 0 % (ref 0.0–0.2)

## 2019-05-29 LAB — URINALYSIS, ROUTINE W REFLEX MICROSCOPIC
Bilirubin Urine: NEGATIVE
Glucose, UA: NEGATIVE mg/dL
Hgb urine dipstick: NEGATIVE
Ketones, ur: NEGATIVE mg/dL
Nitrite: NEGATIVE
Protein, ur: NEGATIVE mg/dL
Specific Gravity, Urine: 1.019 (ref 1.005–1.030)
pH: 7 (ref 5.0–8.0)

## 2019-05-29 MED ORDER — SCOPOLAMINE 1 MG/3DAYS TD PT72
1.0000 | MEDICATED_PATCH | TRANSDERMAL | Status: DC
  Administered 2019-05-30: 1.5 mg via TRANSDERMAL
  Filled 2019-05-29: qty 1

## 2019-05-29 NOTE — MAU Provider Note (Signed)
Chief Complaint: Emesis and Abdominal Pain   First Provider Initiated Contact with Patient 05/29/19 2308        SUBJECTIVE HPI: Brandy Ferguson is a 34 y.o. G3P2002 at [redacted]w[redacted]d by LMP who presents to maternity admissions reporting pain in the right lower abdomen.  She also has persistent nausea and vomiting.  States it was better when she was on medrol taper and got worse afterward. . She denies vaginal bleeding, vaginal itching/burning, urinary symptoms, h/a, dizziness, or fever/chills.    Emesis  This is a recurrent problem. The problem has been unchanged. There has been no fever. Associated symptoms include abdominal pain. Pertinent negatives include no chills, diarrhea, dizziness, fever or headaches.  Abdominal Pain This is a new problem. The current episode started 1 to 4 weeks ago. The onset quality is gradual. The problem occurs constantly. The problem has been unchanged. The pain is located in the RLQ. The quality of the pain is dull. The abdominal pain does not radiate. Associated symptoms include vomiting. Pertinent negatives include no anorexia, constipation, diarrhea, dysuria, fever or headaches. The pain is aggravated by palpation and movement. The pain is relieved by nothing. She has tried nothing for the symptoms. appendectomy 2005   RN Note: Pt here for N/V and RLQ abdominal pain. Pain has been ongoing for several weeks. Pt states it feels like a "pulled muscle". Pain happens when coughing, movement, sudden changes. Pt reports over the last 2-3 days it is constant. Pt denies vaginal bleeding or discharge. Pt has not tried anything for pain. Rates 8/10.   Past Medical History:  Diagnosis Date  . Anemia   . DOE (dyspnea on exertion)   . GERD (gastroesophageal reflux disease)    with pregnancy  . Rapid heart beat    Past Surgical History:  Procedure Laterality Date  . APPENDECTOMY    . CESAREAN SECTION    . CESAREAN SECTION N/A 03/06/2015   Procedure: CESAREAN SECTION;  Surgeon:  Carrington Clamp, MD;  Location: [facility];  Service: Obstetrics;  Laterality: N/A;  EDD: 03/10/15  . WISDOM TOOTH EXTRACTION     Social History   Socioeconomic History  . Marital status: Married    Spouse name: Brandy Ferguson  . Number of children: Not on file  . Years of education: Not on file  . Highest education level: Not on file  Occupational History  . Occupation: [employer]  Tobacco Use  . Smoking status: Former Smoker    Packs/day: 0.50    Years: 7.00    Pack years: 3.50    Types: Cigarettes    Quit date: 03/08/2014    Years since quitting: 5.2  . Smokeless tobacco: Never Used  Substance and Sexual Activity  . Alcohol use: Not Currently    Comment: occ  . Drug use: No  . Sexual activity: Yes    Birth control/protection: None  Other Topics Concern  . Not on file  Social History Narrative  . Not on file   Social Determinants of Health   Financial Resource Strain:   . Difficulty of Paying Living Expenses:   Food Insecurity:   . Worried About Programme researcher, broadcasting/film/video in the Last Year:   . Barista in the Last Year:   Transportation Needs:   . Freight forwarder (Medical):   Marland Kitchen Lack of Transportation (Non-Medical):   Physical Activity:   . Days of Exercise per Week:   . Minutes of Exercise per Session:   Stress:   .  Feeling of Stress :   Social Connections:   . Frequency of Communication with Friends and Family:   . Frequency of Social Gatherings with Friends and Family:   . Attends Religious Services:   . Active Member of Clubs or Organizations:   . Attends Banker Meetings:   Marland Kitchen Marital Status:   Intimate Partner Violence:   . Fear of Current or Ex-Partner:   . Emotionally Abused:   Marland Kitchen Physically Abused:   . Sexually Abused:    No current facility-administered medications on file prior to encounter.   Current Outpatient Medications on File Prior to Encounter  Medication Sig Dispense Refill  . glycopyrrolate (ROBINUL) 1 MG  tablet Take 1 tablet (1 mg total) by mouth every 8 (eight) hours as needed (excessive saliva). 30 tablet 0  . pantoprazole (PROTONIX) 20 MG tablet Take 1 tablet (20 mg total) by mouth daily. 30 tablet 0  . Prenatal Vit-Fe Fumarate-FA (PRENATAL MULTIVITAMIN) TABS tablet Take 1 tablet by mouth daily. 30 tablet 1  . prochlorperazine (COMPAZINE) 10 MG tablet Take 1 tablet (10 mg total) by mouth 2 (two) times daily as needed for nausea or vomiting. 30 tablet 0  . ferrous sulfate 325 (65 FE) MG tablet Take 1 tablet (325 mg total) by mouth daily with breakfast. (Patient not taking: Reported on 12/07/2018) 30 tablet 3  . ondansetron (ZOFRAN ODT) 4 MG disintegrating tablet Take 1 tablet (4 mg total) by mouth every 6 (six) hours as needed for nausea. 20 tablet 0  . valACYclovir (VALTREX) 1000 MG tablet Take 1 tablet (1,000 mg total) by mouth 3 (three) times daily. (Patient not taking: Reported on 12/07/2018) 21 tablet 0   Allergies  Allergen Reactions  . Bactrim [Sulfamethoxazole-Trimethoprim] Hives  . Flagyl [Metronidazole] Hives  . Lubricants Hives    KY jelly causes itching and hives  . Penicillins Other (See Comments)    Has patient had a PCN reaction causing immediate rash, facial/tongue/throat swelling, SOB or lightheadedness with hypotension: Yes Has patient had a PCN reaction causing severe rash involving mucus membranes or skin necrosis: No Has patient had a PCN reaction that required hospitalization Yes Has patient had a PCN reaction occurring within the last 10 years: No If all of the above answers are "NO", then may proceed with Cephalosporin use. Pt reports taking Amoxicillin without any adverse effects    I have reviewed patient's Past Medical Hx, Surgical Hx, Family Hx, Social Hx, medications and allergies.   ROS:  Review of Systems  Constitutional: Negative for chills and fever.  Gastrointestinal: Positive for abdominal pain and vomiting. Negative for anorexia, constipation and  diarrhea.  Genitourinary: Negative for dysuria.  Neurological: Negative for dizziness and headaches.   Review of Systems  Other systems negative   Physical Exam  Physical Exam Patient Vitals for the past 24 hrs:  BP Temp Temp src Pulse Resp Height Weight  05/29/19 2241 116/82 98.1 F (36.7 C) Oral 73 16 -- --  05/29/19 2231 -- -- -- -- -- 5\' 3"  (1.6 m) 67.3 kg   Constitutional: Well-developed, well-nourished female in no acute distress.  Cardiovascular: normal rate Respiratory: normal effort GI: Abd soft, tender over Right lower quadrant.  No rebound or guarding . Pos BS x 4 MS: Extremities nontender, no edema, normal ROM Neurologic: Alert and oriented x 4.  GU: Neg CVAT.  PELVIC EXAM:   FHT 161 by doppler  LAB RESULTS Results for orders placed or performed during the hospital encounter of 05/29/19 (  from the past 24 hour(s))  Urinalysis, Routine w reflex microscopic     Status: Abnormal   Collection Time: 05/29/19 10:45 PM  Result Value Ref Range   Color, Urine YELLOW YELLOW   APPearance HAZY (A) CLEAR   Specific Gravity, Urine 1.019 1.005 - 1.030   pH 7.0 5.0 - 8.0   Glucose, UA NEGATIVE NEGATIVE mg/dL   Hgb urine dipstick NEGATIVE NEGATIVE   Bilirubin Urine NEGATIVE NEGATIVE   Ketones, ur NEGATIVE NEGATIVE mg/dL   Protein, ur NEGATIVE NEGATIVE mg/dL   Nitrite NEGATIVE NEGATIVE   Leukocytes,Ua TRACE (A) NEGATIVE   RBC / HPF 0-5 0 - 5 RBC/hpf   WBC, UA 0-5 0 - 5 WBC/hpf   Bacteria, UA RARE (A) NONE SEEN   Squamous Epithelial / LPF 11-20 0 - 5  CBC with Differential/Platelet     Status: Abnormal   Collection Time: 05/29/19 11:31 PM  Result Value Ref Range   WBC 6.1 4.0 - 10.5 K/uL   RBC 3.82 (L) 3.87 - 5.11 MIL/uL   Hemoglobin 12.7 12.0 - 15.0 g/dL   HCT 36.6 36.0 - 46.0 %   MCV 95.8 80.0 - 100.0 fL   MCH 33.2 26.0 - 34.0 pg   MCHC 34.7 30.0 - 36.0 g/dL   RDW 14.1 11.5 - 15.5 %   Platelets 195 150 - 400 K/uL   nRBC 0.0 0.0 - 0.2 %   Neutrophils Relative %  59 %   Neutro Abs 3.7 1.7 - 7.7 K/uL   Lymphocytes Relative 29 %   Lymphs Abs 1.8 0.7 - 4.0 K/uL   Monocytes Relative 8 %   Monocytes Absolute 0.5 0.1 - 1.0 K/uL   Eosinophils Relative 3 %   Eosinophils Absolute 0.2 0.0 - 0.5 K/uL   Basophils Relative 1 %   Basophils Absolute 0.0 0.0 - 0.1 K/uL   Immature Granulocytes 0 %   Abs Immature Granulocytes 0.02 0.00 - 0.07 K/uL  Comprehensive metabolic panel     Status: Abnormal   Collection Time: 05/29/19 11:31 PM  Result Value Ref Range   Sodium 133 (L) 135 - 145 mmol/L   Potassium 3.7 3.5 - 5.1 mmol/L   Chloride 104 98 - 111 mmol/L   CO2 22 22 - 32 mmol/L   Glucose, Bld 87 70 - 99 mg/dL   BUN 7 6 - 20 mg/dL   Creatinine, Ser 0.53 0.44 - 1.00 mg/dL   Calcium 9.0 8.9 - 10.3 mg/dL   Total Protein 6.2 (L) 6.5 - 8.1 g/dL   Albumin 3.2 (L) 3.5 - 5.0 g/dL   AST 21 15 - 41 U/L   ALT 29 0 - 44 U/L   Alkaline Phosphatase 40 38 - 126 U/L   Total Bilirubin 0.3 0.3 - 1.2 mg/dL   GFR calc non Af Amer >60 >60 mL/min   GFR calc Af Amer >60 >60 mL/min   Anion gap 7 5 - 15    IMAGING US OB Comp Less 14 Wks  Result Date: 05/30/2019 CLINICAL DATA:  Initial evaluation for acute severe right lower quadrant pain, early pregnancy. EXAM: OBSTETRIC <14 WK ULTRASOUND TECHNIQUE: Transabdominal ultrasound was performed for evaluation of the gestation as well as the maternal uterus and adnexal regions. COMPARISON:  Prior ultrasound from 05/15/2019. FINDINGS: Intrauterine gestational sac: Single Yolk sac:  Not visualized. Embryo:  Present Cardiac Activity: Present Heart Rate: 159 bpm CRL: 44.5 mm   11 w 1 d  [place] EDC: 12/07/2019 Subchorionic hemorrhage:  None visualized. Maternal uterus/adnexae: Left ovary normal in appearance. 2.5 x 1.5 x 2.5 cm corpus luteal cyst seen within the right ovary. No free fluid. IMPRESSION: 1. Single viable intrauterine pregnancy as above, estimated gestational age [redacted] weeks and 1 day, with ultrasound EDC of 12/07/2019.  No complication. 2. 2.5 cm degenerating right ovarian corpus luteal cyst, which could contribute to right lower quadrant pain. 3. No other acute maternal uterine or adnexal abnormality identified. Electronically Signed   By: Rise Mu M.D.   On: 05/30/2019 00:00     MAU Management/MDM: Ordered followup Ultrasound to assess for possible ovarian cyst on right They did see a degenerating CLC on that side which could account for pain.  We gave a liter of fluid for rehydration We gave Pepcid per request for acid reflux.  Requests change from Protonix to Pepcid Scopolamine patch applied She had good relief and had no vomiting while here  ASSESSMENT S[place]gle IUP at [redacted]w[redacted]d Persistent nausea and vomiting, hyperemesis RLQ pain, likely due to degenerating corpus luteal cyst  PLAN Discharge home Rx Pepcid for GERD D/C protonix Rx Scopolamine patch for nausea Tylenol for pain Followup in office as scheduled  Pt stable at time of discharge. Encouraged to return here or to other Urgent Care/ED if she develops worsening of symptoms, increase in pain, fever, or other concerning symptoms.    Wynelle Bourgeois CNM, MSN Certified Nurse-Midwife 05/29/2019  11:08 PM

## 2019-05-29 NOTE — MAU Note (Signed)
Pt here for N/V and RLQ abdominal pain. Pain has been ongoing for several weeks. Pt states it feels like a "pulled muscle". Pain happens when coughing, movement, sudden changes. Pt reports over the last 2-3 days it is constant. Pt denies vaginal bleeding or discharge. Pt has not tried anything for pain. Rates 8/10.

## 2019-05-30 LAB — COMPREHENSIVE METABOLIC PANEL
ALT: 29 U/L (ref 0–44)
AST: 21 U/L (ref 15–41)
Albumin: 3.2 g/dL — ABNORMAL LOW (ref 3.5–5.0)
Alkaline Phosphatase: 40 U/L (ref 38–126)
Anion gap: 7 (ref 5–15)
BUN: 7 mg/dL (ref 6–20)
CO2: 22 mmol/L (ref 22–32)
Calcium: 9 mg/dL (ref 8.9–10.3)
Chloride: 104 mmol/L (ref 98–111)
Creatinine, Ser: 0.53 mg/dL (ref 0.44–1.00)
GFR calc Af Amer: >60 mL/min (ref >60)
GFR calc non Af Amer: >60 mL/min (ref >60)
Glucose, Bld: 87 mg/dL (ref 70–99)
Potassium: 3.7 mmol/L (ref 3.5–5.1)
Sodium: 133 mmol/L — ABNORMAL LOW (ref 135–145)
Total Bilirubin: 0.3 mg/dL (ref 0.3–1.2)
Total Protein: 6.2 g/dL — ABNORMAL LOW (ref 6.5–8.1)

## 2019-05-30 MED ORDER — LACTATED RINGERS IV SOLN: Freq: Once | INTRAVENOUS | Status: AC

## 2019-05-30 MED ORDER — FAMOTIDINE 20 MG PO TABS: 20.0000 mg | ORAL_TABLET | Freq: Two times a day (BID) | ORAL | 0 refills | Status: DC

## 2019-05-30 MED ORDER — FAMOTIDINE IN NACL 20-0.9 MG/50ML-% IV SOLN
20.0000 mg | Freq: Once | INTRAVENOUS | Status: AC
  Administered 2019-05-30: 20 mg via INTRAVENOUS
  Filled 2019-05-30: qty 50

## 2019-05-30 MED ORDER — SCOPOLAMINE 1 MG/3DAYS TD PT72: 1.0000 | MEDICATED_PATCH | TRANSDERMAL | 12 refills | Status: DC

## 2019-05-30 MED ORDER — PROMETHAZINE HCL 25 MG/ML IJ SOLN
12.5000 mg | Freq: Once | INTRAMUSCULAR | Status: AC
  Administered 2019-05-30: 12.5 mg via INTRAVENOUS
  Filled 2019-05-30: qty 1

## 2019-05-30 NOTE — Discharge Instructions (Signed)

## 2019-06-14 LAB — OB RESULTS CONSOLE RPR: RPR: NONREACTIVE

## 2019-06-14 LAB — OB RESULTS CONSOLE HIV ANTIBODY (ROUTINE TESTING): HIV: NONREACTIVE

## 2019-06-14 LAB — OB RESULTS CONSOLE HEPATITIS B SURFACE ANTIGEN: Hepatitis B Surface Ag: NEGATIVE

## 2019-06-14 LAB — OB RESULTS CONSOLE GC/CHLAMYDIA
Chlamydia: NEGATIVE
Gonorrhea: NEGATIVE

## 2019-06-14 LAB — OB RESULTS CONSOLE ABO/RH: RH Type: POSITIVE

## 2019-06-14 LAB — OB RESULTS CONSOLE ANTIBODY SCREEN: Antibody Screen: NEGATIVE

## 2019-06-14 LAB — OB RESULTS CONSOLE RUBELLA ANTIBODY, IGM: Rubella: IMMUNE

## 2019-06-27 ENCOUNTER — Inpatient Hospital Stay (HOSPITAL_COMMUNITY)
Admission: AD | Admit: 2019-06-27 | Discharge: 2019-06-28 | Disposition: A | Discharge status: Home / Self Care | Payer: Medicaid Other | Attending: Obstetrics and Gynecology | Admitting: Obstetrics and Gynecology

## 2019-06-27 ENCOUNTER — Other Ambulatory Visit: Payer: Self-pay

## 2019-06-27 DIAGNOSIS — Z87891 Personal history of nicotine dependence: Secondary | ICD-10-CM | POA: Insufficient documentation

## 2019-06-27 DIAGNOSIS — Z880 Allergy status to penicillin: Secondary | ICD-10-CM | POA: Diagnosis not present

## 2019-06-27 DIAGNOSIS — O219 Vomiting of pregnancy, unspecified: Secondary | ICD-10-CM | POA: Diagnosis not present

## 2019-06-27 DIAGNOSIS — Z3A15 15 weeks gestation of pregnancy: Secondary | ICD-10-CM | POA: Insufficient documentation

## 2019-06-27 DIAGNOSIS — O210 Mild hyperemesis gravidarum: Secondary | ICD-10-CM | POA: Insufficient documentation

## 2019-06-27 LAB — URINALYSIS, ROUTINE W REFLEX MICROSCOPIC
Bilirubin Urine: NEGATIVE
Glucose, UA: NEGATIVE mg/dL
Hgb urine dipstick: NEGATIVE
Ketones, ur: NEGATIVE mg/dL
Nitrite: NEGATIVE
Protein, ur: NEGATIVE mg/dL
Specific Gravity, Urine: 1.017 (ref 1.005–1.030)
pH: 7 (ref 5.0–8.0)

## 2019-06-27 MED ORDER — FAMOTIDINE IN NACL 20-0.9 MG/50ML-% IV SOLN
20.0000 mg | Freq: Once | INTRAVENOUS | Status: AC
  Administered 2019-06-27: 20 mg via INTRAVENOUS
  Filled 2019-06-27: qty 50

## 2019-06-27 MED ORDER — ONDANSETRON 4 MG PO TBDP
4.0000 mg | ORAL_TABLET | Freq: Once | ORAL | Status: AC
  Administered 2019-06-27: 23:00:00 4 mg via ORAL
  Filled 2019-06-27: qty 1

## 2019-06-27 MED ORDER — M.V.I. ADULT IV INJ
Freq: Once | INTRAVENOUS | Status: AC
  Filled 2019-06-27: qty 10

## 2019-06-27 MED ORDER — PROMETHAZINE HCL 25 MG/ML IJ SOLN
12.5000 mg | Freq: Once | INTRAMUSCULAR | Status: AC
  Administered 2019-06-27: 12.5 mg via INTRAVENOUS
  Filled 2019-06-27: qty 1

## 2019-06-27 MED ORDER — LACTATED RINGERS IV SOLN: INTRAVENOUS | Status: DC

## 2019-06-27 NOTE — MAU Note (Signed)
Patient states she has ongoing N/V and has been treating w/ diclegis and phenergan suppositories without relief.  Vomited X 12 in the past 24 hours.  Patient denies VB/discharge.  Some lower abdominal pain that is constant and is worse when she gets up and walks.

## 2019-06-27 NOTE — MAU Provider Note (Signed)
Chief Complaint:  Nausea and Emesis   First Provider Initiated Contact with Patient 06/27/19 2008     HPI: Brandy Ferguson is a 34 y.o. G3P2002 at 67w4dwho presents to maternity admissions reporting persistent nausea and vomiting.  Has talked to her providers and has Steroids prescribed, but they told her to come in and get fluids and Phenergan before filling the Rx tomorrow. . She denies LOF, vaginal bleeding, vaginal itching/burning, urinary symptoms, h/a, dizziness, diarrhea, constipation or fever/chills.   Emesis  This is a recurrent problem. The current episode started yesterday. The problem occurs more than 10 times per day. The problem has been unchanged. There has been no fever. Associated symptoms include abdominal pain (cramping off and on lower abd). Pertinent negatives include no chills, diarrhea, dizziness, fever or myalgias. Treatments tried: Diclegis.    RN note: Patient states she has ongoing N/V and has been treating w/ diclegis and phenergan suppositories without relief.  Vomited X 12 in the past 24 hours.  Patient denies VB/discharge.  Some lower abdominal pain that is constant and is worse when she gets up and walks.     Past Medical History: Past Medical History:  Diagnosis Date  . Anemia   . DOE (dyspnea on exertion)   . GERD (gastroesophageal reflux disease)    with pregnancy  . Rapid heart beat     Past obstetric history: OB History  Gravida Para Term Preterm AB Living  3 2 2     2   SAB TAB Ectopic Multiple Live Births        0 2    # Outcome Date GA Lbr Len/2nd Weight Sex Delivery Anes PTL Lv  3 Current           2 Term 03/06/15 [redacted]w[redacted]d  2665 g F CS-LTranv Spinal  LIV  1 Term 05/11/04 [redacted]w[redacted]d  3544 g F CS-LTranv  Y LIV    Past Surgical History: Past Surgical History:  Procedure Laterality Date  . APPENDECTOMY    . CESAREAN SECTION    . CESAREAN SECTION N/A 03/06/2015   Procedure: CESAREAN SECTION;  Surgeon: 03/08/2015, MD;  Location: [facility];   Service: Obstetrics;  Laterality: N/A;  EDD: 03/10/15  . WISDOM TOOTH EXTRACTION      Family History: Family History  Problem Relation Age of Onset  . Hypertension Mother   . Kidney disease Paternal Grandmother   . Alcohol abuse Neg Hx   . Arthritis Neg Hx   . Asthma Neg Hx   . Birth defects Neg Hx   . Cancer Neg Hx   . COPD Neg Hx   . Depression Neg Hx   . Diabetes Neg Hx   . Drug abuse Neg Hx   . Early death Neg Hx   . Hearing loss Neg Hx   . Heart disease Neg Hx   . Hyperlipidemia Neg Hx   . Learning disabilities Neg Hx   . Mental illness Neg Hx   . Mental retardation Neg Hx   . Miscarriages / Stillbirths Neg Hx   . Stroke Neg Hx   . Vision loss Neg Hx   . Varicose Veins Neg Hx     Social History: Social History   Tobacco Use  . Smoking status: Former Smoker    Packs/day: 0.50    Years: 7.00    Pack years: 3.50    Types: Cigarettes    Quit date: 03/08/2014    Years since quitting: 5.3  . Smokeless tobacco: Never  Used  Substance Use Topics  . Alcohol use: Not Currently    Comment: occ  . Drug use: No    Allergies:  Allergies  Allergen Reactions  . Bactrim [Sulfamethoxazole-Trimethoprim] Hives  . Flagyl [Metronidazole] Hives  . Lubricants Hives    KY jelly causes itching and hives  . Penicillins Other (See Comments)    Has patient had a PCN reaction causing immediate rash, facial/tongue/throat swelling, SOB or lightheadedness with hypotension: Yes Has patient had a PCN reaction causing severe rash involving mucus membranes or skin necrosis: No Has patient had a PCN reaction that required hospitalization Yes Has patient had a PCN reaction occurring within the last 10 years: No If all of the above answers are "NO", then may proceed with Cephalosporin use. Pt reports taking Amoxicillin without any adverse effects    Meds:  Medications Prior to Admission  Medication Sig Dispense Refill Last Dose  . Doxylamine-Pyridoxine 10-10 MG TBEC Take by mouth.    06/27/2019 at 1700  . promethazine (PHENERGAN) 25 MG suppository Place 25 mg rectally every 6 (six) hours as needed for nausea or vomiting.   06/26/2019 at 2230  . famotidine (PEPCID) 20 MG tablet Take 1 tablet (20 mg total) by mouth 2 (two) times daily. 30 tablet 0   . ferrous sulfate 325 (65 FE) MG tablet Take 1 tablet (325 mg total) by mouth daily with breakfast. (Patient not taking: Reported on 12/07/2018) 30 tablet 3   . glycopyrrolate (ROBINUL) 1 MG tablet Take 1 tablet (1 mg total) by mouth every 8 (eight) hours as needed (excessive saliva). 30 tablet 0   . ondansetron (ZOFRAN ODT) 4 MG disintegrating tablet Take 1 tablet (4 mg total) by mouth every 6 (six) hours as needed for nausea. 20 tablet 0   . Prenatal Vit-Fe Fumarate-FA (PRENATAL MULTIVITAMIN) TABS tablet Take 1 tablet by mouth daily. 30 tablet 1   . prochlorperazine (COMPAZINE) 10 MG tablet Take 1 tablet (10 mg total) by mouth 2 (two) times daily as needed for nausea or vomiting. 30 tablet 0   . scopolamine (TRANSDERM-SCOP) 1 MG/3DAYS Place 1 patch (1.5 mg total) onto the skin every 3 (three) days. 10 patch 12   . valACYclovir (VALTREX) 1000 MG tablet Take 1 tablet (1,000 mg total) by mouth 3 (three) times daily. (Patient not taking: Reported on 12/07/2018) 21 tablet 0     I have reviewed patient's Past Medical Hx, Surgical Hx, Family Hx, Social Hx, medications and allergies.   ROS:  Review of Systems  Constitutional: Negative for chills and fever.  Gastrointestinal: Positive for abdominal pain (cramping off and on lower abd) and vomiting. Negative for diarrhea.  Musculoskeletal: Negative for myalgias.  Neurological: Negative for dizziness.   Other systems negative  Physical Exam   Patient Vitals for the past 24 hrs:  BP Temp Pulse Resp Weight  06/27/19 1952 106/65 98.7 F (37.1 C) 75 17 68.9 kg   Constitutional: Well-developed female in no acute distress.  Cardiovascular: normal rate and rhythm Respiratory: normal effort,  clear to auscultation bilaterally GI: Abd soft, non-tender, gravid appropriate for gestational age.   No rebound or guarding. MS: Extremities nontender, no edema, normal ROM Neurologic: Alert and oriented x 4.  GU: Neg CVAT.  PELVIC EXAM:   FHT: 145   Labs: Results for orders placed or performed during the hospital encounter of 06/27/19 (from the past 24 hour(s))  Urinalysis, Routine w reflex microscopic     Status: Abnormal   Collection Time: 06/27/19  7:55 PM  Result Value Ref Range   Color, Urine YELLOW YELLOW   APPearance CLOUDY (A) CLEAR   Specific Gravity, Urine 1.017 1.005 - 1.030   pH 7.0 5.0 - 8.0   Glucose, UA NEGATIVE NEGATIVE mg/dL   Hgb urine dipstick NEGATIVE NEGATIVE   Bilirubin Urine NEGATIVE NEGATIVE   Ketones, ur NEGATIVE NEGATIVE mg/dL   Protein, ur NEGATIVE NEGATIVE mg/dL   Nitrite NEGATIVE NEGATIVE   Leukocytes,Ua TRACE (A) NEGATIVE   RBC / HPF 0-5 0 - 5 RBC/hpf   WBC, UA 11-20 0 - 5 WBC/hpf   Bacteria, UA RARE (A) NONE SEEN   Squamous Epithelial / LPF 6-10 0 - 5   Mucus PRESENT     Imaging:    MAU Course/MDM: I have ordered labs and reviewed results. UA is negative for ketones   Treatments in MAU included IV hydration, Phenergan.   She received a liter of LR, MVI infusion.  2 liters total  She was given phenergan, Pepcid and zofran with relief of her symptoms  Was able to tolerate PO intake afterward.   Assessment: Single IUP at [redacted]w[redacted]d Nausea and vomiting  Plan: Discharge home Follow up in Office for prenatal visits Advance diet as tolerated Continue meds at home  Pt stable at time of discharge.  Hansel Feinstein CNM, MSN Certified Nurse-Midwife 06/27/2019 8:08 PM

## 2019-06-28 DIAGNOSIS — Z3A15 15 weeks gestation of pregnancy: Secondary | ICD-10-CM

## 2019-06-28 DIAGNOSIS — O219 Vomiting of pregnancy, unspecified: Secondary | ICD-10-CM

## 2019-06-28 NOTE — Discharge Instructions (Signed)
Hyperemesis Gravidarum Hyperemesis gravidarum is a severe form of nausea and vomiting that happens during pregnancy. Hyperemesis is worse than morning sickness. It may cause you to have nausea or vomiting all day for many days. It may keep you from eating and drinking enough food and liquids, which can lead to dehydration, malnutrition, and weight loss. Hyperemesis usually occurs during the first half (the first 20 weeks) of pregnancy. It often goes away once a woman is in her second half of pregnancy. However, sometimes hyperemesis continues through an entire pregnancy. What are the causes? The cause of this condition is not known. It may be related to changes in chemicals (hormones) in the body during pregnancy, such as the high level of pregnancy hormone (human chorionic gonadotropin) or the increase in the female sex hormone (estrogen). What are the signs or symptoms? Symptoms of this condition include:  Nausea that does not go away.  Vomiting that does not allow you to keep any food down.  Weight loss.  Body fluid loss (dehydration).  Having no desire to eat, or not liking food that you have previously enjoyed. How is this diagnosed? This condition may be diagnosed based on:  A physical exam.  Your medical history.  Your symptoms.  Blood tests.  Urine tests. How is this treated? This condition is managed by controlling symptoms. This may include:  Following an eating plan. This can help lessen nausea and vomiting.  Taking prescription medicines. An eating plan and medicines are often used together to help control symptoms. If medicines do not help relieve nausea and vomiting, you may need to receive fluids through an IV at the hospital. Follow these instructions at home: Eating and drinking   Avoid the following: ? Drinking fluids with meals. Try not to drink anything during the 30 minutes before and after your meals. ? Drinking more than 1 cup of fluid at a  time. ? Eating foods that trigger your symptoms. These may include spicy foods, coffee, high-fat foods, very sweet foods, and acidic foods. ? Skipping meals. Nausea can be more intense on an empty stomach. If you cannot tolerate food, do not force it. Try sucking on ice chips or other frozen items and make up for missed calories later. ? Lying down within 2 hours after eating. ? Being exposed to environmental triggers. These may include food smells, smoky rooms, closed spaces, rooms with strong smells, warm or humid places, overly loud and noisy rooms, and rooms with motion or flickering lights. Try eating meals in a well-ventilated area that is free of strong smells. ? Quick and sudden changes in your movement. ? Taking iron pills and multivitamins that contain iron. If you take prescription iron pills, do not stop taking them unless your health care provider approves. ? Preparing food. The smell of food can spoil your appetite or trigger nausea.  To help relieve your symptoms: ? Listen to your body. Everyone is different and has different preferences. Find what works best for you. ? Eat and drink slowly. ? Eat 5-6 small meals daily instead of 3 large meals. Eating small meals and snacks can help you avoid an empty stomach. ? In the morning, before getting out of bed, eat a couple of crackers to avoid moving around on an empty stomach. ? Try eating starchy foods as these are usually tolerated well. Examples include cereal, toast, bread, potatoes, pasta, rice, and pretzels. ? Include at least 1 serving of protein with your meals and snacks. Protein options include   lean meats, poultry, seafood, beans, nuts, nut butters, eggs, cheese, and yogurt. ? Try eating a protein-rich snack before bed. Examples of a protein-rick snack include cheese and crackers or a peanut butter sandwich made with 1 slice of whole-wheat bread and 1 tsp (5 g) of peanut butter. ? Eat or suck on things that have ginger in them.  It may help relieve nausea. Add  tsp ground ginger to hot tea or choose ginger tea. ? Try drinking 100% fruit juice or an electrolyte drink. An electrolyte drink contains sodium, potassium, and chloride. ? Drink fluids that are cold, clear, and carbonated or sour. Examples include lemonade, ginger ale, lemon-lime soda, ice water, and sparkling water. ? Brush your teeth or use a mouth rinse after meals. ? Talk with your health care provider about starting a supplement of vitamin B6. General instructions  Take over-the-counter and prescription medicines only as told by your health care provider.  Follow instructions from your health care provider about eating or drinking restrictions.  Continue to take your prenatal vitamins as told by your health care provider. If you are having trouble taking your prenatal vitamins, talk with your health care provider about different options.  Keep all follow-up and pre-birth (prenatal) visits as told by your health care provider. This is important. Contact a health care provider if:  You have pain in your abdomen.  You have a severe headache.  You have vision problems.  You are losing weight.  You feel weak or dizzy. Get help right away if:  You cannot drink fluids without vomiting.  You vomit blood.  You have constant nausea and vomiting.  You are very weak.  You faint.  You have a fever and your symptoms suddenly get worse. Summary  Hyperemesis gravidarum is a severe form of nausea and vomiting that happens during pregnancy.  Making some changes to your eating habits may help relieve nausea and vomiting.  This condition may be managed with medicine.  If medicines do not help relieve nausea and vomiting, you may need to receive fluids through an IV at the hospital. This information is not intended to replace advice given to you by your health care provider. Make sure you discuss any questions you have with your health care  provider. Document Revised: 03/14/2017 Document Reviewed: 10/22/2015 [employer] Patient Education  2020 [employer]  

## 2019-07-05 ENCOUNTER — Other Ambulatory Visit: Payer: Self-pay

## 2019-07-05 ENCOUNTER — Inpatient Hospital Stay (HOSPITAL_BASED_OUTPATIENT_CLINIC_OR_DEPARTMENT_OTHER): Payer: Medicaid Other

## 2019-07-05 ENCOUNTER — Inpatient Hospital Stay (HOSPITAL_COMMUNITY)
Admission: AD | Admit: 2019-07-05 | Discharge: 2019-07-05 | Disposition: A | Discharge status: Home / Self Care | Payer: Medicaid Other | Source: Ambulatory Visit | Attending: Obstetrics and Gynecology | Admitting: Obstetrics and Gynecology

## 2019-07-05 ENCOUNTER — Encounter (HOSPITAL_COMMUNITY): Payer: Self-pay | Admitting: Obstetrics and Gynecology

## 2019-07-05 DIAGNOSIS — O26892 Other specified pregnancy related conditions, second trimester: Secondary | ICD-10-CM

## 2019-07-05 DIAGNOSIS — Z881 Allergy status to other antibiotic agents status: Secondary | ICD-10-CM | POA: Insufficient documentation

## 2019-07-05 DIAGNOSIS — Z87891 Personal history of nicotine dependence: Secondary | ICD-10-CM | POA: Diagnosis not present

## 2019-07-05 DIAGNOSIS — Z880 Allergy status to penicillin: Secondary | ICD-10-CM | POA: Diagnosis not present

## 2019-07-05 DIAGNOSIS — Z3686 Encounter for antenatal screening for cervical length: Secondary | ICD-10-CM | POA: Diagnosis not present

## 2019-07-05 DIAGNOSIS — Z3A16 16 weeks gestation of pregnancy: Secondary | ICD-10-CM | POA: Insufficient documentation

## 2019-07-05 DIAGNOSIS — Z79899 Other long term (current) drug therapy: Secondary | ICD-10-CM | POA: Diagnosis not present

## 2019-07-05 DIAGNOSIS — R109 Unspecified abdominal pain: Secondary | ICD-10-CM | POA: Diagnosis not present

## 2019-07-05 DIAGNOSIS — O210 Mild hyperemesis gravidarum: Secondary | ICD-10-CM | POA: Insufficient documentation

## 2019-07-05 LAB — URINALYSIS, ROUTINE W REFLEX MICROSCOPIC
Bilirubin Urine: NEGATIVE
Glucose, UA: NEGATIVE mg/dL
Hgb urine dipstick: NEGATIVE
Ketones, ur: 20 mg/dL — AB
Leukocytes,Ua: NEGATIVE
Nitrite: NEGATIVE
Protein, ur: NEGATIVE mg/dL
Specific Gravity, Urine: 1.008 (ref 1.005–1.030)
pH: 7 (ref 5.0–8.0)

## 2019-07-05 LAB — COMPREHENSIVE METABOLIC PANEL
ALT: 16 U/L (ref 0–44)
AST: 13 U/L — ABNORMAL LOW (ref 15–41)
Albumin: 3.1 g/dL — ABNORMAL LOW (ref 3.5–5.0)
Alkaline Phosphatase: 38 U/L (ref 38–126)
Anion gap: 7 (ref 5–15)
BUN: 5 mg/dL — ABNORMAL LOW (ref 6–20)
CO2: 23 mmol/L (ref 22–32)
Calcium: 9.1 mg/dL (ref 8.9–10.3)
Chloride: 101 mmol/L (ref 98–111)
Creatinine, Ser: 0.47 mg/dL (ref 0.44–1.00)
GFR calc Af Amer: >60 mL/min (ref >60)
GFR calc non Af Amer: >60 mL/min (ref >60)
Glucose, Bld: 69 mg/dL — ABNORMAL LOW (ref 70–99)
Potassium: 3.8 mmol/L (ref 3.5–5.1)
Sodium: 131 mmol/L — ABNORMAL LOW (ref 135–145)
Total Bilirubin: 0.7 mg/dL (ref 0.3–1.2)
Total Protein: 6.3 g/dL — ABNORMAL LOW (ref 6.5–8.1)

## 2019-07-05 LAB — CBC
HCT: 37.6 % (ref 36.0–46.0)
Hemoglobin: 12.8 g/dL (ref 12.0–15.0)
MCH: 33.2 pg (ref 26.0–34.0)
MCHC: 34 g/dL (ref 30.0–36.0)
MCV: 97.4 fL (ref 80.0–100.0)
Platelets: 246 10*3/uL (ref 150–400)
RBC: 3.86 MIL/uL — ABNORMAL LOW (ref 3.87–5.11)
RDW: 13.7 % (ref 11.5–15.5)
WBC: 6.9 10*3/uL (ref 4.0–10.5)
nRBC: 0 % (ref 0.0–0.2)

## 2019-07-05 MED ORDER — SCOPOLAMINE 1 MG/3DAYS TD PT72
1.0000 | MEDICATED_PATCH | TRANSDERMAL | Status: DC
  Administered 2019-07-05: 20:00:00 1.5 mg via TRANSDERMAL
  Filled 2019-07-05: qty 1

## 2019-07-05 MED ORDER — M.V.I. ADULT IV INJ
Freq: Once | INTRAVENOUS | Status: AC
  Filled 2019-07-05: qty 1000

## 2019-07-05 MED ORDER — LACTATED RINGERS IV BOLUS
1000.0000 mL | Freq: Once | INTRAVENOUS | Status: AC
  Administered 2019-07-05: 1000 mL via INTRAVENOUS

## 2019-07-05 MED ORDER — PROMETHAZINE HCL 25 MG/ML IJ SOLN
12.5000 mg | Freq: Once | INTRAMUSCULAR | Status: AC
  Administered 2019-07-05: 14:00:00 12.5 mg via INTRAVENOUS
  Filled 2019-07-05: qty 1

## 2019-07-05 MED ORDER — SCOPOLAMINE 1 MG/3DAYS TD PT72: 1.0000 | MEDICATED_PATCH | TRANSDERMAL | 12 refills | Status: DC

## 2019-07-05 MED ORDER — FAMOTIDINE IN NACL 20-0.9 MG/50ML-% IV SOLN
20.0000 mg | Freq: Once | INTRAVENOUS | Status: AC
  Administered 2019-07-05: 14:00:00 20 mg via INTRAVENOUS
  Filled 2019-07-05: qty 50

## 2019-07-05 MED ORDER — ONDANSETRON 4 MG PO TBDP: 4.0000 mg | ORAL_TABLET | Freq: Four times a day (QID) | ORAL | 2 refills | Status: AC | PRN

## 2019-07-05 MED ORDER — CYCLOBENZAPRINE HCL 5 MG PO TABS
10.0000 mg | ORAL_TABLET | Freq: Once | ORAL | Status: AC
  Administered 2019-07-05: 19:00:00 10 mg via ORAL
  Filled 2019-07-05: qty 2

## 2019-07-05 MED ORDER — FAMOTIDINE 20 MG PO TABS
20.0000 mg | ORAL_TABLET | Freq: Two times a day (BID) | ORAL | 5 refills | Status: DC
Start: 2019-07-05 — End: 2019-10-22

## 2019-07-05 MED ORDER — ONDANSETRON HCL 4 MG/2ML IJ SOLN
4.0000 mg | Freq: Once | INTRAMUSCULAR | Status: AC
  Administered 2019-07-05: 15:00:00 4 mg via INTRAVENOUS
  Filled 2019-07-05: qty 2

## 2019-07-05 NOTE — MAU Note (Signed)
On going nausea, vomiting, pain in lower abd.  Has been told it is from her ovaries, but she doesn't think it is still that.  Sent from dr's office

## 2019-07-05 NOTE — MAU Note (Signed)
Unable to urinate at this time.  

## 2019-07-05 NOTE — MAU Provider Note (Signed)
Chief Complaint: Abdominal Pain, Nausea, and Emesis   First Provider Initiated Contact with Patient 07/05/19 1408      SUBJECTIVE HPI: Brandy Ferguson is a 34 y.o. G3P2002 at [redacted]w[redacted]d who presents to maternity admissions reporting nausea, vomiting, and abdominal pain. She reports these symptoms x several weeks that are unchanged. She has tried Phenergan, Compazine, Zofran, Protonix, and steroid taper and symptoms will improve but then return. She has been unable to keep down food or fluids x 24 hours.  The abdominal pain is sharp pain in her low abdomen that occurs when coughing/sneezing or when moving suddenly. There are no other symptoms. She has not tried any other treatments.     HPI  Past Medical History:  Diagnosis Date  . Anemia   . DOE (dyspnea on exertion)   . GERD (gastroesophageal reflux disease)    with pregnancy  . Rapid heart beat    Past Surgical History:  Procedure Laterality Date  . APPENDECTOMY    . CESAREAN SECTION    . CESAREAN SECTION N/A 03/06/2015   Procedure: CESAREAN SECTION;  Surgeon: Carrington Clamp, MD;  Location: [facility];  Service: Obstetrics;  Laterality: N/A;  EDD: 03/10/15  . WISDOM TOOTH EXTRACTION     Social History   Socioeconomic History  . Marital status: Married    Spouse name: dontrae cherry  . Number of children: Not on file  . Years of education: Not on file  . Highest education level: Not on file  Occupational History  . Occupation: [employer]  Tobacco Use  . Smoking status: Former Smoker    Packs/day: 0.50    Years: 7.00    Pack years: 3.50    Types: Cigarettes    Quit date: 03/08/2014    Years since quitting: 5.3  . Smokeless tobacco: Never Used  Substance and Sexual Activity  . Alcohol use: Not Currently    Comment: occ  . Drug use: No  . Sexual activity: Not Currently    Birth control/protection: None  Other Topics Concern  . Not on file  Social History Narrative  . Not on file   Social Determinants of Health    Financial Resource Strain:   . Difficulty of Paying Living Expenses:   Food Insecurity:   . Worried About Programme researcher, broadcasting/film/video in the Last Year:   . Barista in the Last Year:   Transportation Needs:   . Freight forwarder (Medical):   Marland Kitchen Lack of Transportation (Non-Medical):   Physical Activity:   . Days of Exercise per Week:   . Minutes of Exercise per Session:   Stress:   . Feeling of Stress :   Social Connections:   . Frequency of Communication with Friends and Family:   . Frequency of Social Gatherings with Friends and Family:   . Attends Religious Services:   . Active Member of Clubs or Organizations:   . Attends Banker Meetings:   Marland Kitchen Marital Status:   Intimate Partner Violence:   . Fear of Current or Ex-Partner:   . Emotionally Abused:   Marland Kitchen Physically Abused:   . Sexually Abused:    No current facility-administered medications on file prior to encounter.   Current Outpatient Medications on File Prior to Encounter  Medication Sig Dispense Refill  . Prenatal Vit-Fe Fumarate-FA (PRENATAL MULTIVITAMIN) TABS tablet Take 1 tablet by mouth daily. 30 tablet 1  . promethazine (PHENERGAN) 25 MG suppository Place 25 mg rectally every 6 (six)  hours as needed for nausea or vomiting.    . Doxylamine-Pyridoxine 10-10 MG TBEC Take by mouth.    . ferrous sulfate 325 (65 FE) MG tablet Take 1 tablet (325 mg total) by mouth daily with breakfast. (Patient not taking: Reported on 12/07/2018) 30 tablet 3  . glycopyrrolate (ROBINUL) 1 MG tablet Take 1 tablet (1 mg total) by mouth every 8 (eight) hours as needed (excessive saliva). 30 tablet 0  . prochlorperazine (COMPAZINE) 10 MG tablet Take 1 tablet (10 mg total) by mouth 2 (two) times daily as needed for nausea or vomiting. 30 tablet 0  . valACYclovir (VALTREX) 1000 MG tablet Take 1 tablet (1,000 mg total) by mouth 3 (three) times daily. (Patient not taking: Reported on 12/07/2018) 21 tablet 0   Allergies  Allergen  Reactions  . Bactrim [Sulfamethoxazole-Trimethoprim] Hives  . Flagyl [Metronidazole] Hives  . Lubricants Hives    KY jelly causes itching and hives  . Penicillins Other (See Comments)    Has patient had a PCN reaction causing immediate rash, facial/tongue/throat swelling, SOB or lightheadedness with hypotension: Yes Has patient had a PCN reaction causing severe rash involving mucus membranes or skin necrosis: No Has patient had a PCN reaction that required hospitalization Yes Has patient had a PCN reaction occurring within the last 10 years: No If all of the above answers are "NO", then may proceed with Cephalosporin use. Pt reports taking Amoxicillin without any adverse effects    ROS:  Review of Systems  Constitutional: Negative for chills, fatigue and fever.  Eyes: Negative for visual disturbance.  Respiratory: Negative for shortness of breath.   Cardiovascular: Negative for chest pain.  Gastrointestinal: Positive for abdominal pain, nausea and vomiting.  Genitourinary: Negative for difficulty urinating, dysuria, flank pain, pelvic pain, vaginal bleeding, vaginal discharge and vaginal pain.  Neurological: Negative for dizziness and headaches.  Psychiatric/Behavioral: Negative.      I have reviewed patient's Past Medical Hx, Surgical Hx, Family Hx, Social Hx, medications and allergies.   Physical Exam   Patient Vitals for the past 24 hrs:  BP Temp Temp src Pulse Resp SpO2 Height Weight  07/05/19 2010 104/64 -- -- 71 17 -- -- --  07/05/19 1311 102/74 98.7 F (37.1 C) Oral 66 16 100 % 5\' 3"  (1.6 m) 67.7 kg   Constitutional: Well-developed, well-nourished female in no acute distress.  Cardiovascular: normal rate Respiratory: normal effort GI: Abd soft, non-tender. Pos BS x 4 MS: Extremities nontender, no edema, normal ROM Neurologic: Alert and oriented x 4.  GU: Neg CVAT.  PELVIC EXAM:  Dilation: Closed Effacement (%): 50 Cervical Position: Posterior Exam by:: [email or web address], CNM    FHT 155 by doppler  LAB RESULTS Results for orders placed or performed during the hospital encounter of 07/05/19 (from the past 24 hour(s))  CBC     Status: Abnormal   Collection Time: 07/05/19  1:59 PM  Result Value Ref Range   WBC 6.9 4.0 - 10.5 K/uL   RBC 3.86 (L) 3.87 - 5.11 MIL/uL   Hemoglobin 12.8 12.0 - 15.0 g/dL   HCT 07/07/19 53.9 - 76.7 %   MCV 97.4 80.0 - 100.0 fL   MCH 33.2 26.0 - 34.0 pg   MCHC 34.0 30.0 - 36.0 g/dL   RDW 34.1 93.7 - 90.2 %   Platelets 246 150 - 400 K/uL   nRBC 0.0 0.0 - 0.2 %  Comprehensive metabolic panel     Status: Abnormal   Collection Time: 07/05/19  1:59 PM  Result Value Ref Range   Sodium 131 (L) 135 - 145 mmol/L   Potassium 3.8 3.5 - 5.1 mmol/L   Chloride 101 98 - 111 mmol/L   CO2 23 22 - 32 mmol/L   Glucose, Bld 69 (L) 70 - 99 mg/dL   BUN 5 (L) 6 - 20 mg/dL   Creatinine, Ser 7.62 0.44 - 1.00 mg/dL   Calcium 9.1 8.9 - 26.3 mg/dL   Total Protein 6.3 (L) 6.5 - 8.1 g/dL   Albumin 3.1 (L) 3.5 - 5.0 g/dL   AST 13 (L) 15 - 41 U/L   ALT 16 0 - 44 U/L   Alkaline Phosphatase 38 38 - 126 U/L   Total Bilirubin 0.7 0.3 - 1.2 mg/dL   GFR calc non Af Amer >60 >60 mL/min   GFR calc Af Amer >60 >60 mL/min   Anion gap 7 5 - 15  Urinalysis, Routine w reflex microscopic     Status: Abnormal   Collection Time: 07/05/19  4:00 PM  Result Value Ref Range   Color, Urine YELLOW YELLOW   APPearance HAZY (A) CLEAR   Specific Gravity, Urine 1.008 1.005 - 1.030   pH 7.0 5.0 - 8.0   Glucose, UA NEGATIVE NEGATIVE mg/dL   Hgb urine dipstick NEGATIVE NEGATIVE   Bilirubin Urine NEGATIVE NEGATIVE   Ketones, ur 20 (A) NEGATIVE mg/dL   Protein, ur NEGATIVE NEGATIVE mg/dL   Nitrite NEGATIVE NEGATIVE   Leukocytes,Ua NEGATIVE NEGATIVE       IMAGING No results found.  MAU Management/MDM: Orders Placed This Encounter  Procedures  . [place] MFM OB Limited  . Urinalysis, Routine w reflex microscopic  . CBC  . Comprehensive metabolic panel   . Discharge patient    Meds ordered this encounter  Medications  . lactated ringers bolus 1,000 mL  . promethazine (PHENERGAN) injection 12.5 mg  . famotidine (PEPCID) IVPB 20 mg premix  . ondansetron (ZOFRAN) injection 4 mg  . lactated ringers 1,000 mL with multivitamins adult (INFUVITE ADULT) 10 mL infusion  . cyclobenzaprine (FLEXERIL) tablet 10 mg  . scopolamine (TRANSDERM-SCOP) 1 MG/3DAYS    Sig: Place 1 patch (1.5 mg total) onto the skin every 3 (three) days.    Dispense:  10 patch    Refill:  12    Order Specific Question:   Supervising Provider    Answer:   Jaynie Collins A [[id number]]  . famotidine (PEPCID) 20 MG tablet    Sig: Take 1 tablet (20 mg total) by mouth 2 (two) times daily.    Dispense:  60 tablet    Refill:  5    Order Specific Question:   Supervising Provider    Answer:   Jaynie Collins A [[id number]]  . ondansetron (ZOFRAN ODT) 4 MG disintegrating tablet    Sig: Take 1 tablet (4 mg total) by mouth every 6 (six) hours as needed for nausea.    Dispense:  20 tablet    Refill:  2    Order Specific Question:   Supervising Provider    Answer:   Jaynie Collins A [[id number]]  . scopolamine (TRANSDERM-SCOP) 1 MG/3DAYS 1.5 mg    Abdominal pain in pregnancy, cervix closed but subjectively short on exam.  Limited OB US shows cervical length of 5 cm so measured as normal so no evidence of preterm labor.  Nausea and vomiting improved and pt tolerated PO food/fluids after IV fluids and antiemetics.  Change home regimen to  6 hours, Scopolamine patch (  one placed in MAU today), Zofran ODT Q 6 hours PRN and Pepcid 20 mg BID. F/U in office early next week.  Pt discharged with strict return precautions.  ASSESSMENT 1. Hyperemesis arising during pregnancy   2. Abdominal pain during pregnancy in second trimester     PLAN Discharge home Allergies as of 07/05/2019      Reactions   Bactrim [sulfamethoxazole-trimethoprim] Hives   Flagyl [metronidazole] Hives   Lubricants Hives   KY jelly  causes itching and hives   Penicillins Other (See Comments)   Has patient had a PCN reaction causing immediate rash, facial/tongue/throat swelling, SOB or lightheadedness with hypotension: Yes Has patient had a PCN reaction causing severe rash involving mucus membranes or skin necrosis: No Has patient had a PCN reaction that required hospitalization Yes Has patient had a PCN reaction occurring within the last 10 years: No If all of the above answers are "NO", then may proceed with Cephalosporin use. Pt reports taking Amoxicillin without any adverse effects      Medication List    STOP taking these medications   prochlorperazine 10 MG tablet Commonly known as: COMPAZINE   valACYclovir 1000 MG tablet Commonly known as: VALTREX     TAKE these medications   Doxylamine-Pyridoxine 10-10 MG Tbec Take by mouth.   famotidine 20 MG tablet Commonly known as: PEPCID Take 1 tablet (20 mg total) by mouth 2 (two) times daily.   ferrous sulfate 325 (65 FE) MG tablet Take 1 tablet (325 mg total) by mouth daily with breakfast.   glycopyrrolate 1 MG tablet Commonly known as: Robinul Take 1 tablet (1 mg total) by mouth every 8 (eight) hours as needed (excessive saliva).   ondansetron 4 MG disintegrating tablet Commonly known as: Zofran ODT Take 1 tablet (4 mg total) by mouth every 6 (six) hours as needed for nausea.   prenatal multivitamin Tabs tablet Take 1 tablet by mouth daily.   promethazine 25 MG suppository Commonly known as: PHENERGAN Place 25 mg rectally every 6 (six) hours as needed for nausea or vomiting.   scopolamine 1 MG/3DAYS Commonly known as: TRANSDERM-SCOP Place 1 patch (1.5 mg total) onto the skin every 3 (three) days.      Follow-up Information    Meisinger, Sherren Mocha, MD Follow up.   Specialty: Obstetrics and Gynecology Why: Return to MAU as needed for emergencies.  Contact information: [address] [place] [address] [phone]            Fatima Blank Certified Nurse-Midwife 07/05/2019  8:32 PM

## 2019-07-20 ENCOUNTER — Other Ambulatory Visit: Payer: Self-pay | Admitting: Student

## 2019-07-20 ENCOUNTER — Inpatient Hospital Stay (HOSPITAL_COMMUNITY)
Admission: AD | Admit: 2019-07-20 | Discharge: 2019-07-20 | Disposition: A | Discharge status: Home / Self Care | Payer: Medicaid Other | Attending: Obstetrics and Gynecology | Admitting: Obstetrics and Gynecology

## 2019-07-20 ENCOUNTER — Other Ambulatory Visit: Payer: Self-pay

## 2019-07-20 ENCOUNTER — Encounter (HOSPITAL_COMMUNITY): Payer: Self-pay | Admitting: Obstetrics and Gynecology

## 2019-07-20 DIAGNOSIS — Z87891 Personal history of nicotine dependence: Secondary | ICD-10-CM | POA: Insufficient documentation

## 2019-07-20 DIAGNOSIS — R109 Unspecified abdominal pain: Secondary | ICD-10-CM | POA: Insufficient documentation

## 2019-07-20 DIAGNOSIS — O210 Mild hyperemesis gravidarum: Secondary | ICD-10-CM | POA: Insufficient documentation

## 2019-07-20 DIAGNOSIS — K219 Gastro-esophageal reflux disease without esophagitis: Secondary | ICD-10-CM | POA: Insufficient documentation

## 2019-07-20 DIAGNOSIS — Z79899 Other long term (current) drug therapy: Secondary | ICD-10-CM | POA: Insufficient documentation

## 2019-07-20 DIAGNOSIS — Z880 Allergy status to penicillin: Secondary | ICD-10-CM | POA: Insufficient documentation

## 2019-07-20 DIAGNOSIS — O26892 Other specified pregnancy related conditions, second trimester: Secondary | ICD-10-CM | POA: Insufficient documentation

## 2019-07-20 DIAGNOSIS — O99612 Diseases of the digestive system complicating pregnancy, second trimester: Secondary | ICD-10-CM | POA: Insufficient documentation

## 2019-07-20 DIAGNOSIS — Z881 Allergy status to other antibiotic agents status: Secondary | ICD-10-CM | POA: Insufficient documentation

## 2019-07-20 DIAGNOSIS — Z3A18 18 weeks gestation of pregnancy: Secondary | ICD-10-CM | POA: Diagnosis not present

## 2019-07-20 LAB — COMPREHENSIVE METABOLIC PANEL
ALT: 24 U/L (ref 0–44)
ALT: 10 U/L (ref 0–44)
AST: 43 U/L — ABNORMAL HIGH (ref 15–41)
AST: 12 U/L — ABNORMAL LOW (ref 15–41)
Albumin: 3.3 g/dL — ABNORMAL LOW (ref 3.5–5.0)
Albumin: 2.9 g/dL — ABNORMAL LOW (ref 3.5–5.0)
Alkaline Phosphatase: 31 U/L — ABNORMAL LOW (ref 38–126)
Alkaline Phosphatase: 29 U/L — ABNORMAL LOW (ref 38–126)
Anion gap: 11 (ref 5–15)
Anion gap: 8 (ref 5–15)
BUN: 6 mg/dL (ref 6–20)
BUN: 5 mg/dL — ABNORMAL LOW (ref 6–20)
CO2: 20 mmol/L — ABNORMAL LOW (ref 22–32)
CO2: 19 mmol/L — ABNORMAL LOW (ref 22–32)
Calcium: 9 mg/dL (ref 8.9–10.3)
Calcium: 8.5 mg/dL — ABNORMAL LOW (ref 8.9–10.3)
Chloride: 100 mmol/L (ref 98–111)
Chloride: 104 mmol/L (ref 98–111)
Creatinine, Ser: 0.53 mg/dL (ref 0.44–1.00)
Creatinine, Ser: 0.52 mg/dL (ref 0.44–1.00)
GFR calc Af Amer: >60 mL/min (ref >60)
GFR calc Af Amer: >60 mL/min (ref >60)
GFR calc non Af Amer: >60 mL/min (ref >60)
GFR calc non Af Amer: >60 mL/min (ref >60)
Glucose, Bld: 70 mg/dL (ref 70–99)
Glucose, Bld: 69 mg/dL — ABNORMAL LOW (ref 70–99)
Potassium: 5.9 mmol/L — ABNORMAL HIGH (ref 3.5–5.1)
Potassium: 3.5 mmol/L (ref 3.5–5.1)
Sodium: 131 mmol/L — ABNORMAL LOW (ref 135–145)
Sodium: 131 mmol/L — ABNORMAL LOW (ref 135–145)
Total Bilirubin: 0.4 mg/dL (ref 0.3–1.2)
Total Bilirubin: 0.6 mg/dL (ref 0.3–1.2)
Total Protein: 6.6 g/dL (ref 6.5–8.1)
Total Protein: 6 g/dL — ABNORMAL LOW (ref 6.5–8.1)

## 2019-07-20 LAB — CBC
HCT: 39.3 % (ref 36.0–46.0)
Hemoglobin: 13.2 g/dL (ref 12.0–15.0)
MCH: 33.8 pg (ref 26.0–34.0)
MCHC: 33.6 g/dL (ref 30.0–36.0)
MCV: 100.5 fL — ABNORMAL HIGH (ref 80.0–100.0)
Platelets: 180 10*3/uL (ref 150–400)
RBC: 3.91 MIL/uL (ref 3.87–5.11)
RDW: 14.2 % (ref 11.5–15.5)
WBC: 6.1 10*3/uL (ref 4.0–10.5)
nRBC: 0 % (ref 0.0–0.2)

## 2019-07-20 LAB — URINALYSIS, ROUTINE W REFLEX MICROSCOPIC
Bilirubin Urine: NEGATIVE
Glucose, UA: NEGATIVE mg/dL
Hgb urine dipstick: NEGATIVE
Ketones, ur: 20 mg/dL — AB
Nitrite: NEGATIVE
Protein, ur: 30 mg/dL — AB
Specific Gravity, Urine: 1.03 (ref 1.005–1.030)
pH: 6 (ref 5.0–8.0)

## 2019-07-20 MED ORDER — LACTATED RINGERS IV BOLUS: 1000.0000 mL | Freq: Once | INTRAVENOUS | Status: DC

## 2019-07-20 MED ORDER — SCOPOLAMINE 1 MG/3DAYS TD PT72
1.0000 | MEDICATED_PATCH | TRANSDERMAL | Status: DC
  Administered 2019-07-20: 1.5 mg via TRANSDERMAL
  Filled 2019-07-20: qty 1

## 2019-07-20 MED ORDER — SCOPOLAMINE 1 MG/3DAYS TD PT72: 1.0000 | MEDICATED_PATCH | TRANSDERMAL | Status: DC

## 2019-07-20 MED ORDER — PROMETHAZINE HCL 25 MG/ML IJ SOLN
12.5000 mg | Freq: Once | INTRAMUSCULAR | Status: AC
  Administered 2019-07-20: 12.5 mg via INTRAVENOUS
  Filled 2019-07-20: qty 1

## 2019-07-20 MED ORDER — SODIUM CHLORIDE 0.9 % IV SOLN
8.0000 mg | Freq: Once | INTRAVENOUS | Status: AC
  Administered 2019-07-20: 8 mg via INTRAVENOUS
  Filled 2019-07-20: qty 4

## 2019-07-20 MED ORDER — FAMOTIDINE 20 MG PO TABS
20.0000 mg | ORAL_TABLET | Freq: Once | ORAL | Status: AC
  Administered 2019-07-20: 20 mg via ORAL
  Filled 2019-07-20: qty 1

## 2019-07-20 MED ORDER — LACTATED RINGERS IV SOLN: INTRAVENOUS | Status: DC

## 2019-07-20 MED ORDER — LACTATED RINGERS IV BOLUS
1000.0000 mL | Freq: Once | INTRAVENOUS | Status: AC
  Administered 2019-07-20: 1000 mL via INTRAVENOUS

## 2019-07-20 NOTE — MAU Note (Signed)
Been throwing up, nausea and having some stomach pain, heartburn. Been having stomach pain when she walks, this has been going on for a long time.  Dr told her she needed to come in as she hasn't kept anything down in 24hrs.  (pepcid and prontonix, phenergan supp, reglan and zofran)

## 2019-07-20 NOTE — MAU Provider Note (Addendum)
Patient Hamsini Verrilli is a 34 y.o.  G3P2002 at [redacted]w[redacted]d here with complaints of nausea and vomiting. This is an on-going problem in her pregnancy. She denies fever, SOB, chest pain, vaginal bleeding, vaginal discharge, dysuria, contractions. She reports that she has stomach pain that "is normal, like they keep telling me".   She tried to eat coffee, toast (couldn't keep it down), ramen, crackers. She tried drinking water, which she couldn't keep down. She does not know why she has been vomiting; reports vomiting 13 times in the past two days.  History     CSN: [id number]  Arrival date and time: 07/20/19 1223   First Provider Initiated Contact with Patient 07/20/19 1359      Chief Complaint  Patient presents with  . Abdominal Pain  . Nausea  . Emesis  . Heartburn   Emesis  This is a new problem. The current episode started yesterday. The problem occurs more than 10 times per day. The problem has been unchanged. The emesis has an appearance of stomach contents and bile. There has been no fever. Pertinent negatives include no chills, coughing, diarrhea or dizziness.    OB History    Gravida  3   Para  2   Term  2   Preterm      AB      Living  2     SAB      TAB      Ectopic      Multiple  0   Live Births  2           Past Medical History:  Diagnosis Date  . Anemia   . DOE (dyspnea on exertion)   . GERD (gastroesophageal reflux disease)    with pregnancy  . Rapid heart beat     Past Surgical History:  Procedure Laterality Date  . APPENDECTOMY    . CESAREAN SECTION    . CESAREAN SECTION N/A 03/06/2015   Procedure: CESAREAN SECTION;  Surgeon: Bobbye Charleston, MD;  Location: [facility];  Service: Obstetrics;  Laterality: N/A;  EDD: 03/10/15  . WISDOM TOOTH EXTRACTION      Family History  Problem Relation Age of Onset  . Hypertension Mother   . Kidney disease Paternal Grandmother   . Alcohol abuse Neg Hx   . Arthritis Neg Hx   . Asthma Neg Hx   . Birth  defects Neg Hx   . Cancer Neg Hx   . COPD Neg Hx   . Depression Neg Hx   . Diabetes Neg Hx   . Drug abuse Neg Hx   . Early death Neg Hx   . Hearing loss Neg Hx   . Heart disease Neg Hx   . Hyperlipidemia Neg Hx   . Learning disabilities Neg Hx   . Mental illness Neg Hx   . Mental retardation Neg Hx   . Miscarriages / Stillbirths Neg Hx   . Stroke Neg Hx   . Vision loss Neg Hx   . Varicose Veins Neg Hx     Social History   Tobacco Use  . Smoking status: Former Smoker    Packs/day: 0.50    Years: 7.00    Pack years: 3.50    Types: Cigarettes    Quit date: 03/08/2014    Years since quitting: 5.3  . Smokeless tobacco: Never Used  Substance Use Topics  . Alcohol use: Not Currently    Comment: occ  . Drug use: No  Allergies:  Allergies  Allergen Reactions  . Bactrim [Sulfamethoxazole-Trimethoprim] Hives  . Flagyl [Metronidazole] Hives  . Lubricants Hives    KY jelly causes itching and hives  . Penicillins Other (See Comments)    Has patient had a PCN reaction causing immediate rash, facial/tongue/throat swelling, SOB or lightheadedness with hypotension: Yes Has patient had a PCN reaction causing severe rash involving mucus membranes or skin necrosis: No Has patient had a PCN reaction that required hospitalization Yes Has patient had a PCN reaction occurring within the last 10 years: No If all of the above answers are "NO", then may proceed with Cephalosporin use. Pt reports taking Amoxicillin without any adverse effects    Medications Prior to Admission  Medication Sig Dispense Refill Last Dose  . famotidine (PEPCID) 20 MG tablet Take 1 tablet (20 mg total) by mouth 2 (two) times daily. 60 tablet 5 07/20/2019 at Unknown time  . ondansetron (ZOFRAN ODT) 4 MG disintegrating tablet Take 1 tablet (4 mg total) by mouth every 6 (six) hours as needed for nausea. 20 tablet 2 07/20/2019 at Unknown time  . Prenatal Vit-Fe Fumarate-FA (PRENATAL MULTIVITAMIN) TABS tablet Take 1  tablet by mouth daily. 30 tablet 1 07/20/2019 at Unknown time  . promethazine (PHENERGAN) 25 MG suppository Place 25 mg rectally every 6 (six) hours as needed for nausea or vomiting.   07/20/2019 at 0400  . Doxylamine-Pyridoxine 10-10 MG TBEC Take by mouth.     . ferrous sulfate 325 (65 FE) MG tablet Take 1 tablet (325 mg total) by mouth daily with breakfast. (Patient not taking: Reported on 12/07/2018) 30 tablet 3   . glycopyrrolate (ROBINUL) 1 MG tablet Take 1 tablet (1 mg total) by mouth every 8 (eight) hours as needed (excessive saliva). 30 tablet 0   . scopolamine (TRANSDERM-SCOP) 1 MG/3DAYS Place 1 patch (1.5 mg total) onto the skin every 3 (three) days. 10 patch 12     Review of Systems  Constitutional: Negative for chills.  Respiratory: Negative for cough.   Gastrointestinal: Positive for vomiting. Negative for diarrhea.  Neurological: Negative for dizziness.   Physical Exam   Blood pressure 110/76, pulse 82, temperature 98.2 F (36.8 C), resp. rate 16, height 5\' 3"  (1.6 m), weight 68.4 kg, last menstrual period 03/10/2019, SpO2 100 %, unknown if currently breastfeeding.  Physical Exam  Constitutional: She appears well-developed and well-nourished.  HENT:  Head: Normocephalic.  Eyes: Pupils are equal, round, and reactive to light.  Respiratory: Effort normal.  GI: Soft.  Musculoskeletal:        General: Normal range of motion.     Cervical back: Normal range of motion.  Neurological: She is alert.  Skin: Skin is warm and dry.    MAU Course  Procedures  MDM FHR is 152 CBC normal CMP initially showed hyperkalemia; repeat showed normal potassium.  UA is normal, slight dehydration but no signs of infection.  Patient weight has been stable over the past two weeks.  Patient tolerated PO challenge and desires discharge after scop patch.    Assessment and Plan   1. Hyperemesis gravidarum    2. Patient stable for discharge with plan to [facility]  phen[facility]gan infusions. Scop patch in place; reviewed importance of continuing her medication regimen at home.   3. Dr. 05/08/2019 office will facilitate; message sent Dr. Emeline Darling inbox and she is aware that her office will need to faciliate.  Outpatient orders placed for 3/week infusions of 25 phenergan in  1000 ml for 4 weeks.   3. All questions answered; patient stable for discharge with plan to return if symptoms worsen or change.   Charlesetta Garibaldi Deniesha Stenglein 07/20/2019, 2:05 PM

## 2019-07-20 NOTE — Discharge Instructions (Signed)
Morning Sickness ° °Morning sickness is when you feel sick to your stomach (nauseous) during pregnancy. You may feel sick to your stomach and throw up (vomit). You may feel sick in the morning, but you can feel this way at any time of day. Some women feel very sick to their stomach and cannot stop throwing up (hyperemesis gravidarum). °Follow these instructions at home: °Medicines °· Take over-the-counter and prescription medicines only as told by your doctor. Do not take any medicines until you talk with your doctor about them first. °· Taking multivitamins before getting pregnant can stop or lessen the harshness of morning sickness. °Eating and drinking °· Eat dry toast or crackers before getting out of bed. °· Eat 5 or 6 small meals a day. °· Eat dry and bland foods like rice and baked potatoes. °· Do not eat greasy, fatty, or spicy foods. °· Have someone cook for you if the smell of food causes you to feel sick or throw up. °· If you feel sick to your stomach after taking prenatal vitamins, take them at night or with a snack. °· Eat protein when you need a snack. Nuts, yogurt, and cheese are good choices. °· Drink fluids throughout the day. °· Try ginger ale made with real ginger, ginger tea made from fresh grated ginger, or ginger candies. °General instructions °· Do not use any products that have nicotine or tobacco in them, such as cigarettes and e-cigarettes. If you need help quitting, ask your doctor. °· Use an air purifier to keep the air in your house free of smells. °· Get lots of fresh air. °· Try to avoid smells that make you feel sick. °· Try: °? Wearing a bracelet that is used for seasickness (acupressure wristband). °? Going to a doctor who puts thin needles into certain body points (acupuncture) to improve how you feel. °Contact a doctor if: °· You need medicine to feel better. °· You feel dizzy or light-headed. °· You are losing weight. °Get help right away if: °· You feel very sick to your  stomach and cannot stop throwing up. °· You pass out (faint). °· You have very bad pain in your belly. °Summary °· Morning sickness is when you feel sick to your stomach (nauseous) during pregnancy. °· You may feel sick in the morning, but you can feel this way at any time of day. °· Making some changes to what you eat may help your symptoms go away. °This information is not intended to replace advice given to you by your health care provider. Make sure you discuss any questions you have with your health care provider. °Document Revised: 02/04/2017 Document Reviewed: 03/25/2016 °Elsevier Patient Education © 2020 [employer] ° °

## 2019-07-30 ENCOUNTER — Other Ambulatory Visit: Payer: Self-pay | Admitting: Advanced Practice Midwife

## 2019-08-01 ENCOUNTER — Other Ambulatory Visit: Payer: Self-pay

## 2019-08-01 ENCOUNTER — Ambulatory Visit (HOSPITAL_COMMUNITY)
Admission: RE | Admit: 2019-08-01 | Discharge: 2019-08-01 | Disposition: A | Discharge status: Home / Self Care | Payer: Medicaid Other | Source: Ambulatory Visit | Attending: Student | Admitting: Student

## 2019-08-01 DIAGNOSIS — Z3A18 18 weeks gestation of pregnancy: Secondary | ICD-10-CM | POA: Insufficient documentation

## 2019-08-01 DIAGNOSIS — O210 Mild hyperemesis gravidarum: Secondary | ICD-10-CM | POA: Diagnosis not present

## 2019-08-01 MED ORDER — SODIUM CHLORIDE 0.9 % IV SOLN
25.0000 mg | INTRAVENOUS | Status: DC
  Administered 2019-08-01: 25 mg via INTRAVENOUS
  Filled 2019-08-01: qty 1

## 2019-08-02 ENCOUNTER — Ambulatory Visit (HOSPITAL_COMMUNITY)
Admission: RE | Admit: 2019-08-02 | Discharge: 2019-08-02 | Disposition: A | Discharge status: Home / Self Care | Payer: Medicaid Other | Source: Ambulatory Visit | Attending: Obstetrics and Gynecology | Admitting: Obstetrics and Gynecology

## 2019-08-02 ENCOUNTER — Other Ambulatory Visit: Payer: Self-pay

## 2019-08-02 DIAGNOSIS — O210 Mild hyperemesis gravidarum: Secondary | ICD-10-CM | POA: Diagnosis not present

## 2019-08-02 DIAGNOSIS — Z3A18 18 weeks gestation of pregnancy: Secondary | ICD-10-CM | POA: Insufficient documentation

## 2019-08-02 MED ORDER — SODIUM CHLORIDE 0.9 % IV SOLN
25.0000 mg | INTRAVENOUS | Status: DC
  Administered 2019-08-02: 25 mg via INTRAVENOUS
  Filled 2019-08-02: qty 1

## 2019-08-08 ENCOUNTER — Other Ambulatory Visit: Payer: Self-pay

## 2019-08-08 ENCOUNTER — Encounter (HOSPITAL_COMMUNITY)
Admission: RE | Admit: 2019-08-08 | Discharge: 2019-08-08 | Disposition: A | Discharge status: Home / Self Care | Payer: Medicaid Other | Source: Ambulatory Visit | Attending: Student | Admitting: Student

## 2019-08-08 DIAGNOSIS — O210 Mild hyperemesis gravidarum: Secondary | ICD-10-CM | POA: Insufficient documentation

## 2019-08-08 DIAGNOSIS — Z3A18 18 weeks gestation of pregnancy: Secondary | ICD-10-CM | POA: Insufficient documentation

## 2019-08-08 MED ORDER — SODIUM CHLORIDE 0.9 % IV SOLN
25.0000 mg | INTRAVENOUS | Status: DC
  Administered 2019-08-08: 25 mg via INTRAVENOUS
  Filled 2019-08-08: qty 1

## 2019-08-09 ENCOUNTER — Encounter (HOSPITAL_COMMUNITY): Payer: Medicaid Other

## 2019-08-13 ENCOUNTER — Encounter (HOSPITAL_COMMUNITY): Payer: Medicaid Other

## 2019-08-14 ENCOUNTER — Inpatient Hospital Stay (HOSPITAL_COMMUNITY): Admission: RE | Admit: 2019-08-14 | Payer: Medicaid Other | Source: Ambulatory Visit

## 2019-08-15 ENCOUNTER — Other Ambulatory Visit: Payer: Self-pay

## 2019-08-15 ENCOUNTER — Ambulatory Visit (HOSPITAL_COMMUNITY)
Admission: RE | Admit: 2019-08-15 | Discharge: 2019-08-15 | Disposition: A | Discharge status: Home / Self Care | Payer: Medicaid Other | Source: Ambulatory Visit | Attending: Obstetrics and Gynecology | Admitting: Obstetrics and Gynecology

## 2019-08-15 DIAGNOSIS — Z3A18 18 weeks gestation of pregnancy: Secondary | ICD-10-CM | POA: Diagnosis not present

## 2019-08-15 DIAGNOSIS — O210 Mild hyperemesis gravidarum: Secondary | ICD-10-CM | POA: Insufficient documentation

## 2019-08-15 MED ORDER — SODIUM CHLORIDE 0.9 % IV SOLN
25.0000 mg | INTRAVENOUS | Status: DC
  Administered 2019-08-15: 25 mg via INTRAVENOUS
  Filled 2019-08-15: qty 1

## 2019-08-16 ENCOUNTER — Other Ambulatory Visit: Payer: Self-pay

## 2019-08-16 ENCOUNTER — Ambulatory Visit (HOSPITAL_COMMUNITY)
Admission: RE | Admit: 2019-08-16 | Discharge: 2019-08-16 | Disposition: A | Discharge status: Home / Self Care | Payer: Medicaid Other | Source: Ambulatory Visit | Attending: Obstetrics and Gynecology | Admitting: Obstetrics and Gynecology

## 2019-08-16 DIAGNOSIS — Z3A00 Weeks of gestation of pregnancy not specified: Secondary | ICD-10-CM | POA: Insufficient documentation

## 2019-08-16 DIAGNOSIS — O210 Mild hyperemesis gravidarum: Secondary | ICD-10-CM | POA: Diagnosis not present

## 2019-08-16 DIAGNOSIS — O09529 Supervision of elderly multigravida, unspecified trimester: Secondary | ICD-10-CM | POA: Insufficient documentation

## 2019-08-16 MED ORDER — SODIUM CHLORIDE 0.9 % IV SOLN
25.0000 mg | INTRAVENOUS | Status: DC
  Administered 2019-08-16: 25 mg via INTRAVENOUS
  Filled 2019-08-16: qty 1

## 2019-08-20 ENCOUNTER — Inpatient Hospital Stay (HOSPITAL_COMMUNITY): Admission: RE | Admit: 2019-08-20 | Payer: Medicaid Other | Source: Ambulatory Visit

## 2019-08-22 ENCOUNTER — Inpatient Hospital Stay (HOSPITAL_COMMUNITY): Admission: RE | Admit: 2019-08-22 | Payer: Medicaid Other | Source: Ambulatory Visit

## 2019-08-23 ENCOUNTER — Inpatient Hospital Stay (HOSPITAL_COMMUNITY)
Admission: RE | Admit: 2019-08-23 | Discharge: 2019-08-23 | Disposition: A | Discharge status: Home / Self Care | Payer: Medicaid Other | Source: Ambulatory Visit | Attending: Obstetrics and Gynecology | Admitting: Obstetrics and Gynecology

## 2019-08-23 NOTE — Progress Notes (Signed)
PT was a no show/no call for IVF infusion today.

## 2019-09-28 ENCOUNTER — Encounter (HOSPITAL_COMMUNITY): Payer: Self-pay | Admitting: Obstetrics and Gynecology

## 2019-09-28 ENCOUNTER — Inpatient Hospital Stay (HOSPITAL_COMMUNITY)
Admission: AD | Admit: 2019-09-28 | Discharge: 2019-09-28 | Disposition: A | Discharge status: Home / Self Care | Payer: Medicaid Other | Attending: Obstetrics and Gynecology | Admitting: Obstetrics and Gynecology

## 2019-09-28 DIAGNOSIS — Z79899 Other long term (current) drug therapy: Secondary | ICD-10-CM | POA: Diagnosis not present

## 2019-09-28 DIAGNOSIS — O4703 False labor before 37 completed weeks of gestation, third trimester: Secondary | ICD-10-CM | POA: Diagnosis not present

## 2019-09-28 DIAGNOSIS — Z87891 Personal history of nicotine dependence: Secondary | ICD-10-CM | POA: Insufficient documentation

## 2019-09-28 DIAGNOSIS — O26893 Other specified pregnancy related conditions, third trimester: Secondary | ICD-10-CM | POA: Diagnosis not present

## 2019-09-28 DIAGNOSIS — O99613 Diseases of the digestive system complicating pregnancy, third trimester: Secondary | ICD-10-CM | POA: Insufficient documentation

## 2019-09-28 DIAGNOSIS — R109 Unspecified abdominal pain: Secondary | ICD-10-CM | POA: Insufficient documentation

## 2019-09-28 DIAGNOSIS — O212 Late vomiting of pregnancy: Secondary | ICD-10-CM | POA: Insufficient documentation

## 2019-09-28 DIAGNOSIS — R102 Pelvic and perineal pain: Secondary | ICD-10-CM | POA: Insufficient documentation

## 2019-09-28 DIAGNOSIS — Z3A28 28 weeks gestation of pregnancy: Secondary | ICD-10-CM | POA: Insufficient documentation

## 2019-09-28 DIAGNOSIS — M545 Low back pain: Secondary | ICD-10-CM | POA: Diagnosis not present

## 2019-09-28 DIAGNOSIS — Z880 Allergy status to penicillin: Secondary | ICD-10-CM | POA: Diagnosis not present

## 2019-09-28 DIAGNOSIS — K219 Gastro-esophageal reflux disease without esophagitis: Secondary | ICD-10-CM | POA: Insufficient documentation

## 2019-09-28 DIAGNOSIS — Z98891 History of uterine scar from previous surgery: Secondary | ICD-10-CM | POA: Insufficient documentation

## 2019-09-28 DIAGNOSIS — Z881 Allergy status to other antibiotic agents status: Secondary | ICD-10-CM | POA: Insufficient documentation

## 2019-09-28 DIAGNOSIS — O09523 Supervision of elderly multigravida, third trimester: Secondary | ICD-10-CM | POA: Insufficient documentation

## 2019-09-28 DIAGNOSIS — R87610 Atypical squamous cells of undetermined significance on cytologic smear of cervix (ASC-US): Secondary | ICD-10-CM | POA: Insufficient documentation

## 2019-09-28 LAB — URINALYSIS, ROUTINE W REFLEX MICROSCOPIC
Bilirubin Urine: NEGATIVE
Glucose, UA: NEGATIVE mg/dL
Hgb urine dipstick: NEGATIVE
Ketones, ur: NEGATIVE mg/dL
Leukocytes,Ua: NEGATIVE
Nitrite: NEGATIVE
Protein, ur: NEGATIVE mg/dL
Specific Gravity, Urine: 1.021 (ref 1.005–1.030)
pH: 7 (ref 5.0–8.0)

## 2019-09-28 LAB — FETAL FIBRONECTIN: Fetal Fibronectin: NEGATIVE

## 2019-09-28 MED ORDER — LACTATED RINGERS IV SOLN: Freq: Once | INTRAVENOUS | Status: AC

## 2019-09-28 MED ORDER — ONDANSETRON HCL 4 MG/2ML IJ SOLN
4.0000 mg | Freq: Once | INTRAMUSCULAR | Status: AC
  Administered 2019-09-28: 4 mg via INTRAVENOUS
  Filled 2019-09-28: qty 2

## 2019-09-28 MED ORDER — HYOSCYAMINE SULFATE 0.125 MG SL SUBL
0.1250 mg | SUBLINGUAL_TABLET | Freq: Once | SUBLINGUAL | Status: AC
  Administered 2019-09-28: 0.125 mg via SUBLINGUAL
  Filled 2019-09-28: qty 1

## 2019-09-28 MED ORDER — SCOPOLAMINE 1 MG/3DAYS TD PT72
1.0000 | MEDICATED_PATCH | Freq: Once | TRANSDERMAL | Status: DC
  Administered 2019-09-28: 1.5 mg via TRANSDERMAL
  Filled 2019-09-28: qty 1

## 2019-09-28 MED ORDER — LACTATED RINGERS IV SOLN: Freq: Once | INTRAVENOUS | Status: DC

## 2019-09-28 MED ORDER — NIFEDIPINE 10 MG PO CAPS
10.0000 mg | ORAL_CAPSULE | ORAL | Status: DC | PRN
  Filled 2019-09-28: qty 1

## 2019-09-28 MED ORDER — LACTATED RINGERS IV SOLN: INTRAVENOUS | Status: DC

## 2019-09-28 MED ORDER — TERBUTALINE SULFATE 1 MG/ML IJ SOLN
0.2500 mg | Freq: Once | INTRAMUSCULAR | Status: AC
  Administered 2019-09-28: 0.25 mg via SUBCUTANEOUS
  Filled 2019-09-28: qty 1

## 2019-09-28 NOTE — Discharge Instructions (Signed)
Fetal Movement Counts Patient Name: ________________________________________________ Patient Due Date: ____________________ What is a fetal movement count?  A fetal movement count is the number of times that you feel your baby move during a certain amount of time. This may also be called a fetal kick count. A fetal movement count is recommended for every pregnant woman. You may be asked to start counting fetal movements as early as week 28 of your pregnancy. Pay attention to when your baby is most active. You may notice your baby's sleep and wake cycles. You may also notice things that make your baby move more. You should do a fetal movement count:  When your baby is normally most active.  At the same time each day. A good time to count movements is while you are resting, after having something to eat and drink. How do I count fetal movements? 1. Find a quiet, comfortable area. Sit, or lie down on your side. 2. Write down the date, the start time and stop time, and the number of movements that you felt between those two times. Take this information with you to your health care visits. 3. Write down your start time when you feel the first movement. 4. Count kicks, flutters, swishes, rolls, and jabs. You should feel at least 10 movements. 5. You may stop counting after you have felt 10 movements, or if you have been counting for 2 hours. Write down the stop time. 6. If you do not feel 10 movements in 2 hours, contact your health care provider for further instructions. Your health care provider may want to do additional tests to assess your baby's well-being. Contact a health care provider if:  You feel fewer than 10 movements in 2 hours.  Your baby is not moving like he or she usually does. Date: ____________ Start time: ____________ Stop time: ____________ Movements: ____________ Date: ____________ Start time: ____________ Stop time: ____________ Movements: ____________ Date: ____________  Start time: ____________ Stop time: ____________ Movements: ____________ Date: ____________ Start time: ____________ Stop time: ____________ Movements: ____________ Date: ____________ Start time: ____________ Stop time: ____________ Movements: ____________ Date: ____________ Start time: ____________ Stop time: ____________ Movements: ____________ Date: ____________ Start time: ____________ Stop time: ____________ Movements: ____________ Date: ____________ Start time: ____________ Stop time: ____________ Movements: ____________ Date: ____________ Start time: ____________ Stop time: ____________ Movements: ____________ This information is not intended to replace advice given to you by your health care provider. Make sure you discuss any questions you have with your health care provider. Document Revised: 10/12/2018 Document Reviewed: 10/12/2018 Elsevier Patient Education  2020 Else[employer]   Preventing Preterm Birth Preterm birth is when your baby is delivered between 20 weeks and 37 weeks of pregnancy. A full-term pregnancy lasts for at least 37 weeks. Preterm birth can be dangerous for your baby because the last few weeks of pregnancy are an important time for your baby's brain and lungs to grow. Many things can cause a baby to be born early. Sometimes the cause is not known. There are certain factors that make you more likely to experience preterm birth, such as:  Having a previous baby born preterm.  Being pregnant with twins or other multiples.  Having had fertility treatment.  Being overweight or underweight at the start of your pregnancy.  Having any of the following during pregnancy: ? An infection, including a urinary tract infection (UTI) or an STI (sexually transmitted infection). ? High blood pressure. ? Diabetes. ? Vaginal bleeding.  Being age 35 or older.    Being age 18 or younger.  Getting pregnant within 6 months of a previous pregnancy.  Suffering extreme stress or  physical or emotional abuse during pregnancy.  Standing for long periods of time during pregnancy, such as working at a job that requires standing. What are the risks? The most serious risk of preterm birth is that the baby may not survive. This is more likely to happen if a baby is born before 34 weeks. Other risks and complications of preterm birth may include your baby having:  Breathing problems.  Brain damage that affects movement and coordination (cerebral palsy).  Feeding difficulties.  Vision or hearing problems.  Infections or inflammation of the digestive tract (colitis).  Developmental delays.  Learning disabilities.  Higher risk for diabetes, heart disease, and high blood pressure later in life. What can I do to lower my risk?  Medical care The most important thing you can do to lower your risk for preterm birth is to get routine medical care during pregnancy (prenatal care). If you have a high risk of preterm birth, you may be referred to a health care provider who specializes in managing high-risk pregnancies (perinatologist). You may be given medicine to help prevent preterm birth. Lifestyle changes Certain lifestyle changes can also lower your risk of preterm birth:  Wait at least 6 months after a pregnancy to become pregnant again.  Try to plan pregnancy for when you are between 19 and 35 years old.  Get to a healthy weight before getting pregnant. If you are overweight, work with your health care provider to safely lose weight.  Do not use any products that contain nicotine or tobacco, such as cigarettes and e-cigarettes. If you need help quitting, ask your health care provider.  Do not drink alcohol.  Do not use drugs. Where to find support For more support, consider:  Talking with your health care provider.  Talking with a therapist or substance abuse counselor, if you need help quitting.  Working with a diet and nutrition specialist (dietitian) or a  personal trainer to maintain a healthy weight.  Joining a support group. Where to find more information Learn more about preventing preterm birth from:  Centers for Disease Control and Prevention: cdc.gov/reproductivehealth/maternalinfanthealth/pretermbirth.htm  March of Dimes: marchofdimes.org/complications/premature-babies.aspx  American Pregnancy Association: americanpregnancy.org/labor-and-birth/premature-labor Contact a health care provider if:  You have any of the following signs of preterm labor before 37 weeks: ? A change or increase in vaginal discharge. ? Fluid leaking from your vagina. ? Pressure or cramps in your lower abdomen. ? A backache that does not go away or gets worse. ? Regular tightening (contractions) in your lower abdomen. Summary  Preterm birth means having your baby during weeks 20-37 of pregnancy.  Preterm birth may put your baby at risk for physical and mental problems.  Getting good prenatal care can help prevent preterm birth.  You can lower your risk of preterm birth by making certain lifestyle changes, such as not smoking and not using alcohol. This information is not intended to replace advice given to you by your health care provider. Make sure you discuss any questions you have with your health care provider. Document Revised: 02/04/2017 Document Reviewed: 11/01/2015 Elsevier Patient Education  2020 Elsevier Inc.  

## 2019-09-28 NOTE — MAU Provider Note (Addendum)
Chief Complaint:  Abdominal Pain, Back Pain, and Emesis During Pregnancy   First Provider Initiated Contact with Patient 09/28/19 0449     HPI: Brandy Ferguson is a 34 y.o. G3P2002 at 72w6dwho presents to maternity admissions reporting lower abdominal and low back pain that comes and goes like cramping.  Also has persistent nausea and vomiting.  .Has zofran and phenergan for use at home. Stopped going to infusion center She reports good fetal movement, denies LOF, vaginal bleeding, vaginal itching/burning, urinary symptoms, h/a, dizziness, n/v, diarrhea, constipation or fever/chills.  She denies headache, visual changes or RUQ abdominal pain.  Abdominal Pain This is a new problem. The current episode started today. The problem occurs intermittently. The problem has been unchanged. The pain is located in the LLQ, RLQ and suprapubic region. The quality of the pain is cramping. The abdominal pain radiates to the back. Associated symptoms include nausea. Pertinent negatives include no constipation, diarrhea, dysuria, fever or headaches. Nothing aggravates the pain. The pain is relieved by nothing. She has tried nothing for the symptoms.  Back Pain This is a new problem. The current episode started today. The problem is unchanged. The pain is present in the lumbar spine. The quality of the pain is described as cramping. Associated symptoms include abdominal pain. Pertinent negatives include no dysuria, fever or headaches.   RN Note: Pt states she has been having pain in lower back and lower abd since 1600. Denies LOF or VB  Past Medical History: Past Medical History:  Diagnosis Date  . Anemia   . DOE (dyspnea on exertion)   . GERD (gastroesophageal reflux disease)    with pregnancy  . Rapid heart beat     Past obstetric history: OB History  Gravida Para Term Preterm AB Living  3 2 2     2   SAB TAB Ectopic Multiple Live Births        0 2    # Outcome Date GA Lbr Len/2nd Weight Sex Delivery Anes  PTL Lv  3 Current           2 Term 03/06/15 [redacted]w[redacted]d  2665 g F CS-LTranv Spinal  LIV  1 Term 05/11/04 [redacted]w[redacted]d  3544 g F CS-LTranv  Y LIV    Past Surgical History: Past Surgical History:  Procedure Laterality Date  . APPENDECTOMY    . CESAREAN SECTION    . CESAREAN SECTION N/A 03/06/2015   Procedure: CESAREAN SECTION;  Surgeon: 03/08/2015, MD;  Location: [facility];  Service: Obstetrics;  Laterality: N/A;  EDD: 03/10/15  . WISDOM TOOTH EXTRACTION      Family History: Family History  Problem Relation Age of Onset  . Hypertension Mother   . Kidney disease Paternal Grandmother   . Alcohol abuse Neg Hx   . Arthritis Neg Hx   . Asthma Neg Hx   . Birth defects Neg Hx   . Cancer Neg Hx   . COPD Neg Hx   . Depression Neg Hx   . Diabetes Neg Hx   . Drug abuse Neg Hx   . Early death Neg Hx   . Hearing loss Neg Hx   . Heart disease Neg Hx   . Hyperlipidemia Neg Hx   . Learning disabilities Neg Hx   . Mental illness Neg Hx   . Mental retardation Neg Hx   . Miscarriages / Stillbirths Neg Hx   . Stroke Neg Hx   . Vision loss Neg Hx   . Varicose Veins Neg Hx  Social History: Social History   Tobacco Use  . Smoking status: Former Smoker    Packs/day: 0.50    Years: 7.00    Pack years: 3.50    Types: Cigarettes    Quit date: 03/08/2014    Years since quitting: 5.5  . Smokeless tobacco: Never Used  Vaping Use  . Vaping Use: Never used  Substance Use Topics  . Alcohol use: Not Currently    Comment: occ  . Drug use: No    Allergies:  Allergies  Allergen Reactions  . Bactrim [Sulfamethoxazole-Trimethoprim] Hives  . Flagyl [Metronidazole] Hives  . Lubricants Hives    KY jelly causes itching and hives  . Penicillins Other (See Comments)    Has patient had a PCN reaction causing immediate rash, facial/tongue/throat swelling, SOB or lightheadedness with hypotension: Yes Has patient had a PCN reaction causing severe rash involving mucus membranes or skin necrosis: No Has  patient had a PCN reaction that required hospitalization Yes Has patient had a PCN reaction occurring within the last 10 years: No If all of the above answers are "NO", then may proceed with Cephalosporin use. Pt reports taking Amoxicillin without any adverse effects    Meds:  Medications Prior to Admission  Medication Sig Dispense Refill Last Dose  . famotidine (PEPCID) 20 MG tablet Take 1 tablet (20 mg total) by mouth 2 (two) times daily. 60 tablet 5 09/28/2019 at 0300  . ondansetron (ZOFRAN ODT) 4 MG disintegrating tablet Take 1 tablet (4 mg total) by mouth every 6 (six) hours as needed for nausea. 20 tablet 2 09/27/2019 at 1000  . Doxylamine-Pyridoxine 10-10 MG TBEC Take by mouth.     . ferrous sulfate 325 (65 FE) MG tablet Take 1 tablet (325 mg total) by mouth daily with breakfast. (Patient not taking: Reported on 12/07/2018) 30 tablet 3   . glycopyrrolate (ROBINUL) 1 MG tablet Take 1 tablet (1 mg total) by mouth every 8 (eight) hours as needed (excessive saliva). 30 tablet 0   . Prenatal Vit-Fe Fumarate-FA (PRENATAL MULTIVITAMIN) TABS tablet Take 1 tablet by mouth daily. 30 tablet 1   . promethazine (PHENERGAN) 25 MG suppository Place 25 mg rectally every 6 (six) hours as needed for nausea or vomiting.     Marland Kitchen scopolamine (TRANSDERM-SCOP) 1 MG/3DAYS Place 1 patch (1.5 mg total) onto the skin every 3 (three) days. 10 patch 12     I have reviewed patient's Past Medical Hx, Surgical Hx, Family Hx, Social Hx, medications and allergies.   ROS:  Review of Systems  Constitutional: Negative for fever.  Gastrointestinal: Positive for abdominal pain and nausea. Negative for constipation and diarrhea.  Genitourinary: Negative for dysuria.  Musculoskeletal: Positive for back pain.  Neurological: Negative for headaches.   Other systems negative  Physical Exam   Patient Vitals for the past 24 hrs:  BP Temp Pulse Resp Weight  09/28/19 0433 -- -- -- -- 76.4 kg  09/28/19 0426 109/68 97.7 F (36.5  C) 78 16 --   Constitutional: Well-developed, well-nourished female in no acute distress.  Cardiovascular: normal rate and rhythm Respiratory: normal effort, clear to auscultation bilaterally GI: Abd soft, non-tender, gravid appropriate for gestational age.   No rebound or guarding. MS: Extremities nontender, no edema, normal ROM Neurologic: Alert and oriented x 4.  GU: Neg CVAT.  PELVIC EXAM:  Dilation: Closed Effacement (%): 30 Station: Ballotable Exam by:: Wynelle Bourgeois, CNM   FHT:  Baseline 140 , moderate variability, accelerations present, no decelerations Contractions: q  3 mins Irregular  Initially did not trace contractions well     Labs: Results for orders placed or performed during the hospital encounter of 09/28/19 (from the past 24 hour(s))  Urinalysis, Routine w reflex microscopic     Status: None   Collection Time: 09/28/19  4:32 AM  Result Value Ref Range   Color, Urine YELLOW YELLOW   APPearance CLEAR CLEAR   Specific Gravity, Urine 1.021 1.005 - 1.030   pH 7.0 5.0 - 8.0   Glucose, UA NEGATIVE NEGATIVE mg/dL   Hgb urine dipstick NEGATIVE NEGATIVE   Bilirubin Urine NEGATIVE NEGATIVE   Ketones, ur NEGATIVE NEGATIVE mg/dL   Protein, ur NEGATIVE NEGATIVE mg/dL   Nitrite NEGATIVE NEGATIVE   Leukocytes,Ua NEGATIVE NEGATIVE     Imaging:  No results found.  MAU Course/MDM: I have ordered labs and reviewed results. Urine is clear. No sign of infection NST reviewed, reactive Consult Dr Reina Fuse (who called [place] about her) with presentation, exam findings and test results.  Treatments in MAU included IV hydration, Levsin, and Procardia series was ordered.  BP too low to use Procardia so we gave a single dose of Terbutaline  We also gave her zofran for nausea as well as a scopolamine patch.    Contractions persisted despite fluids and Terbutaline  Cervix went from Cl/30% to Cl/50%. Consulted Dr Vergie Living who recommends send the FFn collected earlier and recheck cervix  in an hour.  Assessment: Single IUP at [redacted]w[redacted]d Nausea and vomiting, persistent Preterm uterine contractions with closed cervix but increased effacement.   Plan: Report given to oncoming provider.  Wynelle Bourgeois CNM, MSN Certified Nurse-Midwife 09/28/2019 0800 AM   Patient sleeping when I entered room to discuss results & plan. Irregular uterine irritability on monitor. Fetal fibronectin is negative. Offered to recheck patient's cervix; she refused.  Discussed with patient reassurance of FFN, closed cervix, & decreased ctx on monitor. Offered again to recheck cervix & patient continues to decline. Patient stable to be discharged home. Reviewed reasons to return to MAU. Patient has follow up with her ob/gyn next week.   A:  1. Preterm uterine contractions in third trimester, antepartum   2. [redacted] weeks gestation of pregnancy    P: Discharge home Preterm labor precautions reviewed Keep f/u with ob/gyn  Judeth Horn, NP

## 2019-09-28 NOTE — MAU Note (Signed)
Pt states she has been having pain in lower back and lower abd since 1600. Denies LOF or VB

## 2019-10-22 ENCOUNTER — Inpatient Hospital Stay (HOSPITAL_COMMUNITY)
Admission: AD | Admit: 2019-10-22 | Discharge: 2019-10-23 | Disposition: A | Discharge status: Home / Self Care | Payer: Medicaid Other | Attending: Obstetrics and Gynecology | Admitting: Obstetrics and Gynecology

## 2019-10-22 ENCOUNTER — Other Ambulatory Visit: Payer: Self-pay

## 2019-10-22 DIAGNOSIS — Z3A32 32 weeks gestation of pregnancy: Secondary | ICD-10-CM | POA: Insufficient documentation

## 2019-10-22 DIAGNOSIS — O219 Vomiting of pregnancy, unspecified: Secondary | ICD-10-CM | POA: Diagnosis present

## 2019-10-22 DIAGNOSIS — K219 Gastro-esophageal reflux disease without esophagitis: Secondary | ICD-10-CM | POA: Diagnosis not present

## 2019-10-22 DIAGNOSIS — Z87891 Personal history of nicotine dependence: Secondary | ICD-10-CM | POA: Insufficient documentation

## 2019-10-22 DIAGNOSIS — O210 Mild hyperemesis gravidarum: Secondary | ICD-10-CM | POA: Insufficient documentation

## 2019-10-22 DIAGNOSIS — O99323 Drug use complicating pregnancy, third trimester: Secondary | ICD-10-CM | POA: Diagnosis not present

## 2019-10-22 DIAGNOSIS — O4703 False labor before 37 completed weeks of gestation, third trimester: Secondary | ICD-10-CM

## 2019-10-22 DIAGNOSIS — O09523 Supervision of elderly multigravida, third trimester: Secondary | ICD-10-CM | POA: Insufficient documentation

## 2019-10-22 DIAGNOSIS — F1290 Cannabis use, unspecified, uncomplicated: Secondary | ICD-10-CM | POA: Diagnosis not present

## 2019-10-22 DIAGNOSIS — O99613 Diseases of the digestive system complicating pregnancy, third trimester: Secondary | ICD-10-CM | POA: Diagnosis not present

## 2019-10-22 DIAGNOSIS — R112 Nausea with vomiting, unspecified: Secondary | ICD-10-CM

## 2019-10-22 DIAGNOSIS — Z79899 Other long term (current) drug therapy: Secondary | ICD-10-CM | POA: Diagnosis not present

## 2019-10-22 LAB — BASIC METABOLIC PANEL
Anion gap: 11 (ref 5–15)
BUN: 5 mg/dL — ABNORMAL LOW (ref 6–20)
CO2: 20 mmol/L — ABNORMAL LOW (ref 22–32)
Calcium: 8.7 mg/dL — ABNORMAL LOW (ref 8.9–10.3)
Chloride: 102 mmol/L (ref 98–111)
Creatinine, Ser: 0.46 mg/dL (ref 0.44–1.00)
GFR calc Af Amer: >60 mL/min (ref >60)
GFR calc non Af Amer: >60 mL/min (ref >60)
Glucose, Bld: 74 mg/dL (ref 70–99)
Potassium: 3.2 mmol/L — ABNORMAL LOW (ref 3.5–5.1)
Sodium: 133 mmol/L — ABNORMAL LOW (ref 135–145)

## 2019-10-22 LAB — RAPID URINE DRUG SCREEN, HOSP PERFORMED
Amphetamines: NOT DETECTED
Barbiturates: NOT DETECTED
Benzodiazepines: NOT DETECTED
Cocaine: NOT DETECTED
Opiates: NOT DETECTED
Tetrahydrocannabinol: POSITIVE — AB

## 2019-10-22 MED ORDER — SCOPOLAMINE 1 MG/3DAYS TD PT72
1.0000 | MEDICATED_PATCH | TRANSDERMAL | Status: DC
  Administered 2019-10-22: 1.5 mg via TRANSDERMAL
  Filled 2019-10-22: qty 1

## 2019-10-22 MED ORDER — LACTATED RINGERS IV BOLUS
1000.0000 mL | Freq: Once | INTRAVENOUS | Status: AC
  Administered 2019-10-22: 1000 mL via INTRAVENOUS

## 2019-10-22 MED ORDER — PROMETHAZINE HCL 25 MG/ML IJ SOLN
12.5000 mg | Freq: Once | INTRAMUSCULAR | Status: AC
  Administered 2019-10-22: 12.5 mg via INTRAVENOUS
  Filled 2019-10-22: qty 1

## 2019-10-22 MED ORDER — FAMOTIDINE IN NACL 20-0.9 MG/50ML-% IV SOLN
20.0000 mg | Freq: Once | INTRAVENOUS | Status: AC
  Administered 2019-10-22: 20 mg via INTRAVENOUS
  Filled 2019-10-22: qty 50

## 2019-10-22 MED ORDER — FAMOTIDINE 20 MG PO TABS
20.0000 mg | ORAL_TABLET | Freq: Two times a day (BID) | ORAL | 1 refills | Status: DC
Start: 2019-10-22 — End: 2019-12-15

## 2019-10-22 NOTE — Discharge Instructions (Signed)
-small meals every 2 hours to prevent empty stomach -schedule nausea meds (take them around the clock whether or not you are nauseas) -prescription for pepcid sent to pharmacy -avoid greasy, fatty, spicy, acidic foods -bland diet -stop marijuana, this will help your nausea -if symptoms persist discuss further with obgyn, repeat steroid taper? -stay hydrated! Gatorade, water, pedialyte  Hyperemesis Gravidarum Hyperemesis gravidarum is a severe form of nausea and vomiting that happens during pregnancy. Hyperemesis is worse than morning sickness. It may cause you to have nausea or vomiting all day for many days. It may keep you from eating and drinking enough food and liquids, which can lead to dehydration, malnutrition, and weight loss. Hyperemesis usually occurs during the first half (the first 20 weeks) of pregnancy. It often goes away once a woman is in her second half of pregnancy. However, sometimes hyperemesis continues through an entire pregnancy. What are the causes? The cause of this condition is not known. It may be related to changes in chemicals (hormones) in the body during pregnancy, such as the high level of pregnancy hormone (human chorionic gonadotropin) or the increase in the female sex hormone (estrogen). What are the signs or symptoms? Symptoms of this condition include:  Nausea that does not go away.  Vomiting that does not allow you to keep any food down.  Weight loss.  Body fluid loss (dehydration).  Having no desire to eat, or not liking food that you have previously enjoyed. How is this diagnosed? This condition may be diagnosed based on:  A physical exam.  Your medical history.  Your symptoms.  Blood tests.  Urine tests. How is this treated? This condition is managed by controlling symptoms. This may include:  Following an eating plan. This can help lessen nausea and vomiting.  Taking prescription medicines. An eating plan and medicines are often  used together to help control symptoms. If medicines do not help relieve nausea and vomiting, you may need to receive fluids through an IV at the hospital. Follow these instructions at home: Eating and drinking   Avoid the following: ? Drinking fluids with meals. Try not to drink anything during the 30 minutes before and after your meals. ? Drinking more than 1 cup of fluid at a time. ? Eating foods that trigger your symptoms. These may include spicy foods, coffee, high-fat foods, very sweet foods, and acidic foods. ? Skipping meals. Nausea can be more intense on an empty stomach. If you cannot tolerate food, do not force it. Try sucking on ice chips or other frozen items and make up for missed calories later. ? Lying down within 2 hours after eating. ? Being exposed to environmental triggers. These may include food smells, smoky rooms, closed spaces, rooms with strong smells, warm or humid places, overly loud and noisy rooms, and rooms with motion or flickering lights. Try eating meals in a well-ventilated area that is free of strong smells. ? Quick and sudden changes in your movement. ? Taking iron pills and multivitamins that contain iron. If you take prescription iron pills, do not stop taking them unless your health care provider approves. ? Preparing food. The smell of food can spoil your appetite or trigger nausea.  To help relieve your symptoms: ? Listen to your body. Everyone is different and has different preferences. Find what works best for you. ? Eat and drink slowly. ? Eat 5-6 small meals daily instead of 3 large meals. Eating small meals and snacks can help you avoid an empty  stomach. ? In the morning, before getting out of bed, eat a couple of crackers to avoid moving around on an empty stomach. ? Try eating starchy foods as these are usually tolerated well. Examples include cereal, toast, bread, potatoes, pasta, rice, and pretzels. ? Include at least 1 serving of protein with  your meals and snacks. Protein options include lean meats, poultry, seafood, beans, nuts, nut butters, eggs, cheese, and yogurt. ? Try eating a protein-rich snack before bed. Examples of a protein-rick snack include cheese and crackers or a peanut butter sandwich made with 1 slice of whole-wheat bread and 1 tsp (5 g) of peanut butter. ? Eat or suck on things that have ginger in them. It may help relieve nausea. Add  tsp ground ginger to hot tea or choose ginger tea. ? Try drinking 100% fruit juice or an electrolyte drink. An electrolyte drink contains sodium, potassium, and chloride. ? Drink fluids that are cold, clear, and carbonated or sour. Examples include lemonade, ginger ale, lemon-lime soda, ice water, and sparkling water. ? Brush your teeth or use a mouth rinse after meals. ? Talk with your health care provider about starting a supplement of vitamin B6. General instructions  Take over-the-counter and prescription medicines only as told by your health care provider.  Follow instructions from your health care provider about eating or drinking restrictions.  Continue to take your prenatal vitamins as told by your health care provider. If you are having trouble taking your prenatal vitamins, talk with your health care provider about different options.  Keep all follow-up and pre-birth (prenatal) visits as told by your health care provider. This is important. Contact a health care provider if:  You have pain in your abdomen.  You have a severe headache.  You have vision problems.  You are losing weight.  You feel weak or dizzy. Get help right away if:  You cannot drink fluids without vomiting.  You vomit blood.  You have constant nausea and vomiting.  You are very weak.  You faint.  You have a fever and your symptoms suddenly get worse. Summary  Hyperemesis gravidarum is a severe form of nausea and vomiting that happens during pregnancy.  Making some changes to your  eating habits may help relieve nausea and vomiting.  This condition may be managed with medicine.  If medicines do not help relieve nausea and vomiting, you may need to receive fluids through an IV at the hospital. This information is not intended to replace advice given to you by your health care provider. Make sure you discuss any questions you have with your health care provider. Document Revised: 03/14/2017 Document Reviewed: 10/22/2015 Elsevier Patient Education  2020 [employer].

## 2019-10-22 NOTE — MAU Note (Signed)
Pt reports to MAU stating she is here for abdominal pain that has been ongoing for 2 days. Pt reports it is sharp and it is all of her abdomen. Pt denies bleeding or LOF. Pt reports she has had NV for 3 days now and is unable to keep anything down. No bleeding or LOF. +FM.

## 2019-10-22 NOTE — MAU Provider Note (Signed)
History     [id number]  Arrival date and time: 10/22/19 1952    Chief Complaint  Patient presents with  . Abdominal Pain  . Emesis     HPI Brandy Ferguson is a 34 y.o. at [redacted]w[redacted]d with PMHx notable for hyperemesis, who presents for abdominal pain and nausea. She states she is having braxton-hicks contractions, irregular, every 20 min to 1 hour. No LOF or VB. +FM. She states contractions started today. She endorses decreased PO intake over the past 2-3 days with multiple episodes of NBNB emesis. She had 2 episodes of loose stools today. She denies any sick contacts. No diet changes, states she has followed a bland diet throughout pregnancy. She has tried zofran, phenergan, infusions, diclegis, compazine without relief. She has tried these medications scheduled and around the clock. Scop patch in MAU with relief of symptoms in the past, however, her insurance will not cover this. No fever/chills. No urinary symptoms. No abnormal vaginal discharge. No lightheadedness or syncope. Patient does endorse intermittent marijuana use to help with her appetite.  Review of outside prenatal records from [facility] (in media tab): review of all prenatal records and labs  Review of discharge summary from last admission on: all MAU provider notes reviewed     OB History    Gravida  3   Para  2   Term  2   Preterm      AB      Living  2     SAB      TAB      Ectopic      Multiple  0   Live Births  2           Past Medical History:  Diagnosis Date  . Anemia   . DOE (dyspnea on exertion)   . GERD (gastroesophageal reflux disease)    with pregnancy  . Rapid heart beat     Past Surgical History:  Procedure Laterality Date  . APPENDECTOMY    . CESAREAN SECTION    . CESAREAN SECTION N/A 03/06/2015   Procedure: CESAREAN SECTION;  Surgeon: Carrington Clamp, MD;  Location: [facility];  Service: Obstetrics;  Laterality: N/A;  EDD: 03/10/15  . WISDOM TOOTH EXTRACTION       Family History  Problem Relation Age of Onset  . Hypertension Mother   . Kidney disease Paternal Grandmother   . Alcohol abuse Neg Hx   . Arthritis Neg Hx   . Asthma Neg Hx   . Birth defects Neg Hx   . Cancer Neg Hx   . COPD Neg Hx   . Depression Neg Hx   . Diabetes Neg Hx   . Drug abuse Neg Hx   . Early death Neg Hx   . Hearing loss Neg Hx   . Heart disease Neg Hx   . Hyperlipidemia Neg Hx   . Learning disabilities Neg Hx   . Mental illness Neg Hx   . Mental retardation Neg Hx   . Miscarriages / Stillbirths Neg Hx   . Stroke Neg Hx   . Vision loss Neg Hx   . Varicose Veins Neg Hx     Social History   Socioeconomic History  . Marital status: Married    Spouse name: dontrae cherry  . Number of children: Not on file  . Years of education: Not on file  . Highest education level: Not on file  Occupational History  . Occupation: [employer]  Tobacco Use  .  Smoking status: Former Smoker    Packs/day: 0.50    Years: 7.00    Pack years: 3.50    Types: Cigarettes    Quit date: 03/08/2014    Years since quitting: 5.6  . Smokeless tobacco: Never Used  Vaping Use  . Vaping Use: Never used  Substance and Sexual Activity  . Alcohol use: Not Currently    Comment: occ  . Drug use: No  . Sexual activity: Not Currently    Birth control/protection: None  Other Topics Concern  . Not on file  Social History Narrative  . Not on file   Social Determinants of Health   Financial Resource Strain:   . Difficulty of Paying Living Expenses:   Food Insecurity:   . Worried About Programme researcher, broadcasting/film/video in the Last Year:   . Barista in the Last Year:   Transportation Needs:   . Freight forwarder (Medical):   Marland Kitchen Lack of Transportation (Non-Medical):   Physical Activity:   . Days of Exercise per Week:   . Minutes of Exercise per Session:   Stress:   . Feeling of Stress :   Social Connections:   . Frequency of Communication with Friends and Family:   .  Frequency of Social Gatherings with Friends and Family:   . Attends Religious Services:   . Active Member of Clubs or Organizations:   . Attends Banker Meetings:   Marland Kitchen Marital Status:   Intimate Partner Violence:   . Fear of Current or Ex-Partner:   . Emotionally Abused:   Marland Kitchen Physically Abused:   . Sexually Abused:     Allergies  Allergen Reactions  . Bactrim [Sulfamethoxazole-Trimethoprim] Hives  . Flagyl [Metronidazole] Hives  . Lubricants Hives    KY jelly causes itching and hives  . Penicillins Other (See Comments)    Has patient had a PCN reaction causing immediate rash, facial/tongue/throat swelling, SOB or lightheadedness with hypotension: Yes Has patient had a PCN reaction causing severe rash involving mucus membranes or skin necrosis: No Has patient had a PCN reaction that required hospitalization Yes Has patient had a PCN reaction occurring within the last 10 years: No If all of the above answers are "NO", then may proceed with Cephalosporin use. Pt reports taking Amoxicillin without any adverse effects    No current facility-administered medications on file prior to encounter.   Current Outpatient Medications on File Prior to Encounter  Medication Sig Dispense Refill  . famotidine (PEPCID) 20 MG tablet Take 1 tablet (20 mg total) by mouth 2 (two) times daily. 60 tablet 5  . ondansetron (ZOFRAN ODT) 4 MG disintegrating tablet Take 1 tablet (4 mg total) by mouth every 6 (six) hours as needed for nausea. 20 tablet 2  . Prenatal Vit-Fe Fumarate-FA (PRENATAL MULTIVITAMIN) TABS tablet Take 1 tablet by mouth daily. 30 tablet 1  . doxylamine, Sleep, (UNISOM SLEEPTABS) 25 MG tablet Unisom (doxylamine) 25 mg tablet  Take 1 tablet twice a day by oral route.    . Doxylamine-Pyridoxine 10-10 MG TBEC Take by mouth.    Marland Kitchen glycopyrrolate (ROBINUL) 1 MG tablet Take 1 tablet (1 mg total) by mouth every 8 (eight) hours as needed (excessive saliva). 30 tablet 0  . promethazine  (PHENERGAN) 25 MG suppository Place 25 mg rectally every 6 (six) hours as needed for nausea or vomiting.    Marland Kitchen scopolamine (TRANSDERM-SCOP) 1 MG/3DAYS Place 1 patch (1.5 mg total) onto the skin every 3 (three) days.  10 patch 12     ROS Pertinent positives and negative per HPI, all others reviewed and negative  Physical Exam   BP 116/67 (BP Location: Right Arm)   Pulse 71   Temp 97.9 F (36.6 C) (Oral)   Resp 18   Ht 5\' 3"  (1.6 m)   LMP 03/10/2019   SpO2 98%   BMI 29.83 kg/m   Physical Exam Vitals and nursing note reviewed. Exam conducted with a chaperone present.  Constitutional:      General: She is not in acute distress.    Appearance: Normal appearance. She is normal weight.  HENT:     Head: Normocephalic and atraumatic.     Nose: Nose normal.     Mouth/Throat:     Mouth: Mucous membranes are moist.     Pharynx: Oropharynx is clear.  Eyes:     Extraocular Movements: Extraocular movements intact.     Conjunctiva/sclera: Conjunctivae normal.  Cardiovascular:     Rate and Rhythm: Normal rate.     Pulses: Normal pulses.  Pulmonary:     Effort: Pulmonary effort is normal.  Abdominal:     Tenderness: There is no abdominal tenderness. There is no guarding or rebound.     Comments: gravid  Musculoskeletal:        General: Normal range of motion.     Cervical back: Normal range of motion and neck supple.  Skin:    General: Skin is warm and dry.  Neurological:     General: No focal deficit present.     Mental Status: She is alert and oriented to person, place, and time. Mental status is at baseline.  Psychiatric:        Mood and Affect: Mood normal.        Behavior: Behavior normal.     Cervical Exam  not indicated  Bedside Ultrasound Not indicated  My interpretation: n/a  FHT Baseline 150bpm, moderate variability, +accels, no decels Toco: quiet Cat: 1  Labs No results found for this or any previous visit (from the past 24 hour(s)).  Imaging No  results found.  MAU Course  Procedures Lab Orders  No laboratory test(s) ordered today   No orders of the defined types were placed in this encounter.  Imaging Orders  No imaging studies ordered today    MDM moderate  Assessment and Plan  34yo G3P2002 at [redacted]w[redacted]d presents for nausea and inability to tolerate PO.  #Nausea and vomiting of pregnancy #Hyperemesis gravidarum  #Cannabis hyperemesis Patient with multiple episodes of nausea and emesis and multiple MAU presentations presents with decreased PO intake and multiple episodes of NBNB x2-3 days. She has multiple prescriptions at home (as listed above), which have not relieved her symptoms. VSS. Exam overall unremarkable. Lab work overall stable from prior. NST reactive, toco quiet. Suspect most likely continuation of patient's hyperemesis gravidarum, suspect that marijuana use is likely contributing to patient's symptoms. Low suspicion for dehydration given reassuring vital signs and physical exam. LR bolus, pepcid, phenergan and scopolamine patch administered with improvement in symptoms. Counseled on continuation of conservative management including scheduled medication usage. Discussed cessation of marijuana use would likely improve symptoms. Patient has completed 2 steroid tapers for these symptoms, which she states were helpful. Encouraged patient to discuss repeat taper with her provider.  Dispo: discharge home  #FWB FHT Cat 1 NST: reactive  [redacted]w[redacted]d, MD OB Fellow, Faculty Practice [facility], [facility] 10/22/2019 9:36 PM

## 2019-10-23 DIAGNOSIS — O210 Mild hyperemesis gravidarum: Secondary | ICD-10-CM | POA: Diagnosis not present

## 2019-10-23 MED ORDER — CYCLOBENZAPRINE HCL 5 MG PO TABS
10.0000 mg | ORAL_TABLET | Freq: Once | ORAL | Status: AC
  Administered 2019-10-23: 10 mg via ORAL
  Filled 2019-10-23: qty 2

## 2019-10-23 MED ORDER — ACETAMINOPHEN 500 MG PO TABS
1000.0000 mg | ORAL_TABLET | Freq: Once | ORAL | Status: AC
  Administered 2019-10-23: 1000 mg via ORAL
  Filled 2019-10-23: qty 2

## 2019-11-07 ENCOUNTER — Ambulatory Visit
Admission: EM | Admit: 2019-11-07 | Discharge: 2019-11-07 | Disposition: A | Discharge status: Home / Self Care | Payer: Medicaid Other | Attending: Internal Medicine | Admitting: Internal Medicine

## 2019-11-07 DIAGNOSIS — Z20822 Contact with and (suspected) exposure to covid-19: Secondary | ICD-10-CM

## 2019-11-07 NOTE — ED Triage Notes (Signed)
Pt presents for COVID testing. Pt denies fever, shortness of breath, cough, or any other symptoms.    

## 2019-11-09 LAB — NOVEL CORONAVIRUS, NAA: SARS-CoV-2, NAA: NOT DETECTED

## 2019-11-12 ENCOUNTER — Other Ambulatory Visit: Payer: Self-pay

## 2019-11-12 ENCOUNTER — Encounter (HOSPITAL_COMMUNITY): Payer: Self-pay | Admitting: Obstetrics and Gynecology

## 2019-11-12 ENCOUNTER — Inpatient Hospital Stay (HOSPITAL_COMMUNITY)
Admission: AD | Admit: 2019-11-12 | Discharge: 2019-11-13 | Disposition: A | Discharge status: Home / Self Care | Payer: Medicaid Other | Attending: Obstetrics and Gynecology | Admitting: Obstetrics and Gynecology

## 2019-11-12 DIAGNOSIS — M545 Low back pain: Secondary | ICD-10-CM | POA: Diagnosis not present

## 2019-11-12 DIAGNOSIS — K219 Gastro-esophageal reflux disease without esophagitis: Secondary | ICD-10-CM | POA: Insufficient documentation

## 2019-11-12 DIAGNOSIS — Z79899 Other long term (current) drug therapy: Secondary | ICD-10-CM | POA: Insufficient documentation

## 2019-11-12 DIAGNOSIS — Z87891 Personal history of nicotine dependence: Secondary | ICD-10-CM | POA: Insufficient documentation

## 2019-11-12 DIAGNOSIS — O368130 Decreased fetal movements, third trimester, not applicable or unspecified: Secondary | ICD-10-CM | POA: Diagnosis not present

## 2019-11-12 DIAGNOSIS — O99613 Diseases of the digestive system complicating pregnancy, third trimester: Secondary | ICD-10-CM | POA: Diagnosis not present

## 2019-11-12 DIAGNOSIS — N949 Unspecified condition associated with female genital organs and menstrual cycle: Secondary | ICD-10-CM

## 2019-11-12 DIAGNOSIS — R1031 Right lower quadrant pain: Secondary | ICD-10-CM | POA: Insufficient documentation

## 2019-11-12 DIAGNOSIS — O99891 Other specified diseases and conditions complicating pregnancy: Secondary | ICD-10-CM

## 2019-11-12 DIAGNOSIS — O4700 False labor before 37 completed weeks of gestation, unspecified trimester: Secondary | ICD-10-CM

## 2019-11-12 DIAGNOSIS — O09523 Supervision of elderly multigravida, third trimester: Secondary | ICD-10-CM | POA: Diagnosis not present

## 2019-11-12 DIAGNOSIS — R1032 Left lower quadrant pain: Secondary | ICD-10-CM | POA: Diagnosis not present

## 2019-11-12 DIAGNOSIS — O4703 False labor before 37 completed weeks of gestation, third trimester: Secondary | ICD-10-CM | POA: Diagnosis not present

## 2019-11-12 DIAGNOSIS — O26893 Other specified pregnancy related conditions, third trimester: Secondary | ICD-10-CM | POA: Diagnosis present

## 2019-11-12 DIAGNOSIS — Z3A35 35 weeks gestation of pregnancy: Secondary | ICD-10-CM | POA: Diagnosis not present

## 2019-11-12 LAB — URINALYSIS, ROUTINE W REFLEX MICROSCOPIC
Bilirubin Urine: NEGATIVE
Glucose, UA: NEGATIVE mg/dL
Hgb urine dipstick: NEGATIVE
Ketones, ur: NEGATIVE mg/dL
Leukocytes,Ua: NEGATIVE
Nitrite: NEGATIVE
Protein, ur: NEGATIVE mg/dL
Specific Gravity, Urine: 1.003 — ABNORMAL LOW (ref 1.005–1.030)
pH: 7 (ref 5.0–8.0)

## 2019-11-12 MED ORDER — NIFEDIPINE 10 MG PO CAPS
10.0000 mg | ORAL_CAPSULE | ORAL | Status: DC | PRN
  Administered 2019-11-12 – 2019-11-13 (×2): 10 mg via ORAL
  Filled 2019-11-12 (×2): qty 1

## 2019-11-12 MED ORDER — CYCLOBENZAPRINE HCL 5 MG PO TABS
5.0000 mg | ORAL_TABLET | Freq: Once | ORAL | Status: AC
  Administered 2019-11-12: 5 mg via ORAL
  Filled 2019-11-12: qty 1

## 2019-11-12 NOTE — MAU Note (Signed)
Pt reports decreased fetal movement today, sharp abd pain since yesterday. Pain is off/on. Denies bleeding.

## 2019-11-12 NOTE — MAU Provider Note (Signed)
Chief Complaint:  Decreased Fetal Movement and Abdominal Pain   First Provider Initiated Contact with Patient 11/12/19 2156     HPI: Brandy Ferguson is a 34 y.o. G3P2002 at 30w2dwho presents to maternity admissions reporting decreased fetal movement and intermittent sharp abdominal pains which radiate to back.  . She denies LOF, vaginal bleeding, vaginal itching/burning, urinary symptoms, h/a, dizziness, n/v, diarrhea, constipation or fever/chills.    Abdominal Pain This is a new problem. The current episode started today. The problem occurs intermittently. The problem has been unchanged. The pain is located in the generalized abdominal region, LLQ and RLQ. The pain is moderate. The quality of the pain is cramping. The abdominal pain radiates to the back. Pertinent negatives include no constipation, diarrhea, dysuria, fever, headaches, myalgias, nausea or vomiting. Nothing aggravates the pain. The pain is relieved by nothing. She has tried nothing for the symptoms.   RN Note: Pt reports decreased fetal movement today, sharp abd pain since yesterday. Pain is off/on. Denies bleeding.   Past Medical History: Past Medical History:  Diagnosis Date  . Anemia   . DOE (dyspnea on exertion)   . GERD (gastroesophageal reflux disease)    with pregnancy  . Rapid heart beat     Past obstetric history: OB History  Gravida Para Term Preterm AB Living  3 2 2     2   SAB TAB Ectopic Multiple Live Births        0 2    # Outcome Date GA Lbr Len/2nd Weight Sex Delivery Anes PTL Lv  3 Current           2 Term 03/06/15 [redacted]w[redacted]d  2665 g F CS-LTranv Spinal  LIV  1 Term 05/11/04 [redacted]w[redacted]d  3544 g F CS-LTranv  Y LIV    Past Surgical History: Past Surgical History:  Procedure Laterality Date  . APPENDECTOMY    . CESAREAN SECTION    . CESAREAN SECTION N/A 03/06/2015   Procedure: CESAREAN SECTION;  Surgeon: 03/08/2015, MD;  Location: [facility];  Service: Obstetrics;  Laterality: N/A;  EDD: 03/10/15  . WISDOM  TOOTH EXTRACTION      Family History: Family History  Problem Relation Age of Onset  . Hypertension Mother   . Heart disease Mother   . Heart attack Mother   . Kidney disease Father   . Kidney disease Paternal Grandmother   . Cervical cancer Paternal Aunt   . Alcohol abuse Neg Hx   . Arthritis Neg Hx   . Asthma Neg Hx   . Birth defects Neg Hx   . Cancer Neg Hx   . COPD Neg Hx   . Depression Neg Hx   . Diabetes Neg Hx   . Drug abuse Neg Hx   . Early death Neg Hx   . Hearing loss Neg Hx   . Hyperlipidemia Neg Hx   . Learning disabilities Neg Hx   . Mental illness Neg Hx   . Mental retardation Neg Hx   . Miscarriages / Stillbirths Neg Hx   . Stroke Neg Hx   . Vision loss Neg Hx   . Varicose Veins Neg Hx     Social History: Social History   Tobacco Use  . Smoking status: Former Smoker    Packs/day: 0.50    Years: 7.00    Pack years: 3.50    Types: Cigarettes    Quit date: 03/08/2014    Years since quitting: 5.6  . Smokeless tobacco: Never Used  Vaping  Use  . Vaping Use: Never used  Substance Use Topics  . Alcohol use: Not Currently    Comment: occ  . Drug use: No    Allergies:  Allergies  Allergen Reactions  . Bactrim [Sulfamethoxazole-Trimethoprim] Hives  . Flagyl [Metronidazole] Hives  . Lubricants Hives    KY jelly causes itching and hives  . Nonoxynol 9 Hives  . Penicillins Other (See Comments)    Has patient had a PCN reaction causing immediate rash, facial/tongue/throat swelling, SOB or lightheadedness with hypotension: Yes Has patient had a PCN reaction causing severe rash involving mucus membranes or skin necrosis: No Has patient had a PCN reaction that required hospitalization Yes Has patient had a PCN reaction occurring within the last 10 years: No If all of the above answers are "NO", then may proceed with Cephalosporin use. Pt reports taking Amoxicillin without any adverse effects    Meds:  Medications Prior to Admission  Medication Sig  Dispense Refill Last Dose  . famotidine (PEPCID) 20 MG tablet Take 1 tablet (20 mg total) by mouth 2 (two) times daily. 60 tablet 1 11/12/2019 at 1930  . ondansetron (ZOFRAN ODT) 4 MG disintegrating tablet Take 1 tablet (4 mg total) by mouth every 6 (six) hours as needed for nausea. 20 tablet 2 11/12/2019 at 0930  . doxylamine, Sleep, (UNISOM SLEEPTABS) 25 MG tablet Unisom (doxylamine) 25 mg tablet  Take 1 tablet twice a day by oral route.     . Doxylamine-Pyridoxine 10-10 MG TBEC Take by mouth.     Marland Kitchen glycopyrrolate (ROBINUL) 1 MG tablet Take 1 tablet (1 mg total) by mouth every 8 (eight) hours as needed (excessive saliva). 30 tablet 0   . Prenatal Vit-Fe Fumarate-FA (PRENATAL MULTIVITAMIN) TABS tablet Take 1 tablet by mouth daily. 30 tablet 1   . promethazine (PHENERGAN) 25 MG suppository Place 25 mg rectally every 6 (six) hours as needed for nausea or vomiting.     Marland Kitchen scopolamine (TRANSDERM-SCOP) 1 MG/3DAYS Place 1 patch (1.5 mg total) onto the skin every 3 (three) days. 10 patch 12     I have reviewed patient's Past Medical Hx, Surgical Hx, Family Hx, Social Hx, medications and allergies.   ROS:  Review of Systems  Constitutional: Negative for fever.  Gastrointestinal: Positive for abdominal pain. Negative for constipation, diarrhea, nausea and vomiting.  Genitourinary: Negative for dysuria.  Musculoskeletal: Negative for myalgias.  Neurological: Negative for headaches.   Other systems negative  Physical Exam   Patient Vitals for the past 24 hrs:  BP Temp Temp src Pulse Resp SpO2  11/12/19 2115 119/73 98.4 F (36.9 C) Oral 88 17 100 %   Constitutional: Well-developed, well-nourished female in no acute distress.  Cardiovascular: normal rate and rhythm Respiratory: normal effort, clear to auscultation bilaterally GI: Abd soft, non-tender, gravid appropriate for gestational age.   No rebound or guarding. MS: Extremities nontender, no edema, normal ROM Neurologic: Alert and oriented x  4.  GU: Neg CVAT.  PELVIC EXAM: Dilation: Fingertip Effacement (%): 70 Station: -3, Ballotable Presentation: Vertex Exam by:: Wynelle Bourgeois, CNM   FHT:  Baseline 140 , moderate variability, accelerations present, no decelerations Contractions: q 2-5 mins Irregular    Labs: Results for orders placed or performed during the hospital encounter of 11/12/19 (from the past 24 hour(s))  Urinalysis, Routine w reflex microscopic Urine, Clean Catch     Status: Abnormal   Collection Time: 11/12/19  9:46 PM  Result Value Ref Range   Color, Urine STRAW (A)  YELLOW   APPearance CLEAR CLEAR   Specific Gravity, Urine 1.003 (L) 1.005 - 1.030   pH 7.0 5.0 - 8.0   Glucose, UA NEGATIVE NEGATIVE mg/dL   Hgb urine dipstick NEGATIVE NEGATIVE   Bilirubin Urine NEGATIVE NEGATIVE   Ketones, ur NEGATIVE NEGATIVE mg/dL   Protein, ur NEGATIVE NEGATIVE mg/dL   Nitrite NEGATIVE NEGATIVE   Leukocytes,Ua NEGATIVE NEGATIVE    Imaging:  No results found.  MAU Course/MDM: I have ordered labs and reviewed results. UA is clear NST reviewed, reactive with audible fetal movement .  Treatments in MAU included Procardia series x 2 doses which caused contractions and thus her pain to subside. We gave Flexeril 5mg  which helped her back pain.    Assessment: Single IUP at [redacted]w[redacted]d Preterm uterine contractions Low back pain Decreased perception of fetal movement, reactive and moving now  Plan: Discharge home Preterm Labor precautions and fetal kick counts Kick counts Follow up in Office for prenatal visits   Encouraged to return here or to other Urgent Care/ED if she develops worsening of symptoms, increase in pain, fever, or other concerning symptoms.  Pt stable at time of discharge.  [redacted]w[redacted]d CNM, MSN Certified Nurse-Midwife 11/12/2019 9:56 PM

## 2019-11-13 DIAGNOSIS — Z3A35 35 weeks gestation of pregnancy: Secondary | ICD-10-CM

## 2019-11-13 DIAGNOSIS — O4703 False labor before 37 completed weeks of gestation, third trimester: Secondary | ICD-10-CM

## 2019-11-13 MED ORDER — CYCLOBENZAPRINE HCL 5 MG PO TABS: 5.0000 mg | ORAL_TABLET | Freq: Three times a day (TID) | ORAL | 0 refills | Status: AC | PRN

## 2019-11-13 NOTE — Discharge Instructions (Signed)
Back Pain in Pregnancy Back pain during pregnancy is common. Back pain may be caused by several factors that are related to changes during your pregnancy. Follow these instructions at home: Managing pain, stiffness, and swelling      If directed, for sudden (acute) back pain, put ice on the painful area. ? Put ice in a plastic bag. ? Place a towel between your skin and the bag. ? Leave the ice on for 20 minutes, 2-3 times per day.  If directed, apply heat to the affected area before you exercise. Use the heat source that your health care provider recommends, such as a moist heat pack or a heating pad. ? Place a towel between your skin and the heat source. ? Leave the heat on for 20-30 minutes. ? Remove the heat if your skin turns bright red. This is especially important if you are unable to feel pain, heat, or cold. You may have a greater risk of getting burned.  If directed, massage the affected area. Activity  Exercise as told by your health care provider. Gentle exercise is the best way to prevent or manage back pain.  Listen to your body when lifting. If lifting hurts, ask for help or bend your knees. This uses your leg muscles instead of your back muscles.  Squat down when picking up something from the floor. Do not bend over.  Only use bed rest for short periods as told by your health care provider. Bed rest should only be used for the most severe episodes of back pain. Standing, sitting, and lying down  Do not stand in one place for long periods of time.  Use good posture when sitting. Make sure your head rests over your shoulders and is not hanging forward. Use a pillow on your lower back if necessary.  Try sleeping on your side, preferably the left side, with a pregnancy support pillow or 1-2 regular pillows between your legs. ? If you have back pain after a night's rest, your bed may be too soft. ? A firm mattress may provide more support for your back during  pregnancy. General instructions  Do not wear high heels.  Eat a healthy diet. Try to gain weight within your health care provider's recommendations.  Use a maternity girdle, elastic sling, or back brace as told by your health care provider.  Take over-the-counter and prescription medicines only as told by your health care provider.  Work with a physical therapist or massage therapist to find ways to manage back pain. Acupuncture or massage therapy may be helpful.  Keep all follow-up visits as told by your health care provider. This is important. Contact a health care provider if:  Your back pain interferes with your daily activities.  You have increasing pain in other parts of your body. Get help right away if:  You develop numbness, tingling, weakness, or problems with the use of your arms or legs.  You develop severe back pain that is not controlled with medicine.  You have a change in bowel or bladder control.  You develop shortness of breath, dizziness, or you faint.  You develop nausea, vomiting, or sweating.  You have back pain that is a rhythmic, cramping pain similar to labor pains. Labor pain is usually 1-2 minutes apart, lasts for about 1 minute, and involves a bearing down feeling or pressure in your pelvis.  You have back pain and your water breaks or you have vaginal bleeding.  You have back pain or numbness  that travels down your leg.  Your back pain developed after you fell.  You develop pain on one side of your back.  You see blood in your urine.  You develop skin blisters in the area of your back pain. Summary  Back pain may be caused by several factors that are related to changes during your pregnancy.  Follow instructions as told by your health care provider for managing pain, stiffness, and swelling.  Exercise as told by your health care provider. Gentle exercise is the best way to prevent or manage back pain.  Take over-the-counter and  prescription medicines only as told by your health care provider.  Keep all follow-up visits as told by your health care provider. This is important. This information is not intended to replace advice given to you by your health care provider. Make sure you discuss any questions you have with your health care provider. Document Revised: 06/13/2018 Document Reviewed: 08/10/2017 Elsevier Patient Education  2020 [employer]. Preterm Labor and Birth Information Pregnancy normally lasts 39-41 weeks. Preterm labor is when labor starts early. It starts before you have been pregnant for 37 whole weeks. What are the risk factors for preterm labor? Preterm labor is more likely to occur in women who:  Have an infection while pregnant.  Have a cervix that is short.  Have gone into preterm labor before.  Have had surgery on their cervix.  Are younger than age 82.  Are older than age 74.  Are African American.  Are pregnant with two or more babies.  Take street drugs while pregnant.  Smoke while pregnant.  Do not gain enough weight while pregnant.  Got pregnant right after another pregnancy. What are the symptoms of preterm labor? Symptoms of preterm labor include:  Cramps. The cramps may feel like the cramps some women get during their period. The cramps may happen with watery poop (diarrhea).  Pain in the belly (abdomen).  Pain in the lower back.  Regular contractions or tightening. It may feel like your belly is getting tighter.  Pressure in the lower belly that seems to get stronger.  More fluid (discharge) leaking from the vagina. The fluid may be watery or bloody.  Water breaking. Why is it important to notice signs of preterm labor? Babies who are born early may not be fully developed. They have a higher chance for:  Long-term heart problems.  Long-term lung problems.  Trouble controlling body systems, like breathing.  Bleeding in the brain.  A condition called  cerebral palsy.  Learning difficulties.  Death. These risks are highest for babies who are born before 34 weeks of pregnancy. How is preterm labor treated? Treatment depends on:  How long you were pregnant.  Your condition.  The health of your baby. Treatment may involve:  Having a stitch (suture) placed in your cervix. When you give birth, your cervix opens so the baby can come out. The stitch keeps the cervix from opening too soon.  Staying at the hospital.  Taking or getting medicines, such as: ? Hormone medicines. ? Medicines to stop contractions. ? Medicines to help the baby's lungs develop. ? Medicines to prevent your baby from having cerebral palsy. What should I do if I am in preterm labor? If you think you are going into labor too soon, call your doctor right away. How can I prevent preterm labor?  Do not use any tobacco products. ? Examples of these are cigarettes, chewing tobacco, and e-cigarettes. ? If you need  help quitting, ask your doctor.  Do not use street drugs.  Do not use any medicines unless you ask your doctor if they are safe for you.  Talk with your doctor before taking any herbal supplements.  Make sure you gain enough weight.  Watch for infection. If you think you might have an infection, get it checked right away.  If you have gone into preterm labor before, tell your doctor. This information is not intended to replace advice given to you by your health care provider. Make sure you discuss any questions you have with your health care provider. Document Revised: 06/16/2018 Document Reviewed: 07/16/2015 Elsevier Patient Education  2020 [employer] Inc[employer]  

## 2019-11-20 LAB — OB RESULTS CONSOLE GBS: GBS: NEGATIVE

## 2019-12-06 ENCOUNTER — Encounter (HOSPITAL_COMMUNITY): Payer: Self-pay

## 2019-12-06 NOTE — Patient Instructions (Signed)
Brandy Ferguson  12/06/2019   Your procedure is scheduled on:  12/12/2019  Arrive at 0630 at [facility] on [address] at [facility]  and [employer]. You are invited to use the FREE valet parking or use the Visitor's parking deck.  Pick up the phone at the desk and dial [phone].  Call this number if you have problems the morning of surgery: [phone]  Remember:   Do not eat food:(After Midnight) Desps de medianoche.  Do not drink clear liquids: (After Midnight) Desps de medianoche.  Take these medicines the morning of surgery with A SIP OF WATER:  none   Do not wear jewelry, make-up or nail polish.  Do not wear lotions, powders, or perfumes. Do not wear deodorant.  Do not shave 48 hours prior to surgery.  Do not bring valuables to the hospital.  [facility] is not   responsible for any belongings or valuables brought to the hospital.  Contacts, dentures or bridgework may not be worn into surgery.  Leave suitcase in the car. After surgery it may be brought to your room.  For patients admitted to the hospital, checkout time is 11:00 AM the day of              discharge.      Please read over the following fact sheets that you were given:     Preparing for Surgery

## 2019-12-10 ENCOUNTER — Other Ambulatory Visit (HOSPITAL_COMMUNITY)
Admission: RE | Admit: 2019-12-10 | Discharge: 2019-12-10 | Disposition: A | Discharge status: Home / Self Care | Payer: Medicaid Other | Source: Ambulatory Visit | Attending: Obstetrics and Gynecology | Admitting: Obstetrics and Gynecology

## 2019-12-10 ENCOUNTER — Other Ambulatory Visit: Payer: Self-pay

## 2019-12-10 DIAGNOSIS — Z01812 Encounter for preprocedural laboratory examination: Secondary | ICD-10-CM | POA: Insufficient documentation

## 2019-12-10 DIAGNOSIS — Z20822 Contact with and (suspected) exposure to covid-19: Secondary | ICD-10-CM | POA: Insufficient documentation

## 2019-12-10 HISTORY — DX: Other diseases of the blood and blood-forming organs and certain disorders involving the immune mechanism complicating pregnancy, unspecified trimester: D69.6

## 2019-12-10 LAB — TYPE AND SCREEN
ABO/RH(D): O POS
Antibody Screen: NEGATIVE

## 2019-12-10 LAB — CBC
HCT: 33.4 % — ABNORMAL LOW (ref 36.0–46.0)
Hemoglobin: 10.6 g/dL — ABNORMAL LOW (ref 12.0–15.0)
MCH: 31.4 pg (ref 26.0–34.0)
MCHC: 31.7 g/dL (ref 30.0–36.0)
MCV: 98.8 fL (ref 80.0–100.0)
Platelets: 123 10*3/uL — ABNORMAL LOW (ref 150–400)
RBC: 3.38 MIL/uL — ABNORMAL LOW (ref 3.87–5.11)
RDW: 14.1 % (ref 11.5–15.5)
WBC: 3.9 10*3/uL — ABNORMAL LOW (ref 4.0–10.5)
nRBC: 0 % (ref 0.0–0.2)

## 2019-12-10 LAB — SARS CORONAVIRUS 2 (TAT 6-24 HRS): SARS Coronavirus 2: NEGATIVE

## 2019-12-10 LAB — RPR: RPR Ser Ql: NONREACTIVE

## 2019-12-10 NOTE — MAU Note (Signed)
Asymptomatic, swab collected. Waiting on lab 

## 2019-12-11 NOTE — Anesthesia Preprocedure Evaluation (Addendum)
Anesthesia Evaluation  Patient identified by MRN, date of birth, ID band Patient awake    Reviewed: Allergy & Precautions, NPO status , Patient's Chart, lab work & pertinent test results  Airway Mallampati: II  TM Distance: >3 FB Neck ROM: Full    Dental no notable dental hx.  Tongue ring:   Pulmonary former smoker,    Pulmonary exam normal breath sounds clear to auscultation       Cardiovascular negative cardio ROS Normal cardiovascular exam Rhythm:Regular Rate:Normal     Neuro/Psych negative neurological ROS  negative psych ROS   GI/Hepatic Neg liver ROS, GERD  Medicated and Controlled,  Endo/Other  negative endocrine ROS  Renal/GU negative Renal ROS     Musculoskeletal negative musculoskeletal ROS (+)   Abdominal (+) + obese,   Peds  Hematology negative hematology ROS (+) anemia ,   Anesthesia Other Findings Repeat c-section Desires sterilization   Reproductive/Obstetrics (+) Pregnancy                            Anesthesia Physical Anesthesia Plan  ASA: II  Anesthesia Plan: Spinal   Post-op Pain Management:    Induction:   PONV Risk Score and Plan: 2 and Ondansetron, Dexamethasone and Treatment may vary due to age or medical condition  Airway Management Planned: Natural Airway  Additional Equipment:   Intra-op Plan:   Post-operative Plan:   Informed Consent: I have reviewed the patients History and Physical, chart, labs and discussed the procedure including the risks, benefits and alternatives for the proposed anesthesia with the patient or authorized representative who has indicated his/her understanding and acceptance.     Dental advisory given  Plan Discussed with: CRNA  Anesthesia Plan Comments:        Anesthesia Quick Evaluation

## 2019-12-12 ENCOUNTER — Inpatient Hospital Stay (HOSPITAL_COMMUNITY): Payer: Medicaid Other | Admitting: Anesthesiology

## 2019-12-12 ENCOUNTER — Encounter (HOSPITAL_COMMUNITY): Payer: Self-pay | Admitting: Obstetrics and Gynecology

## 2019-12-12 ENCOUNTER — Inpatient Hospital Stay (HOSPITAL_COMMUNITY)
Admission: RE | Admit: 2019-12-12 | Discharge: 2019-12-15 | DRG: 785 | Disposition: A | Discharge status: Home / Self Care | Payer: Medicaid Other | Attending: Obstetrics and Gynecology | Admitting: Obstetrics and Gynecology

## 2019-12-12 ENCOUNTER — Encounter (HOSPITAL_COMMUNITY)
Admission: RE | Disposition: A | Discharge status: Home / Self Care | Payer: Self-pay | Source: Home / Self Care | Attending: Obstetrics and Gynecology

## 2019-12-12 DIAGNOSIS — O34211 Maternal care for low transverse scar from previous cesarean delivery: Principal | ICD-10-CM | POA: Diagnosis present

## 2019-12-12 DIAGNOSIS — Z20822 Contact with and (suspected) exposure to covid-19: Secondary | ICD-10-CM | POA: Diagnosis present

## 2019-12-12 DIAGNOSIS — Z148 Genetic carrier of other disease: Secondary | ICD-10-CM | POA: Diagnosis not present

## 2019-12-12 DIAGNOSIS — Z302 Encounter for sterilization: Secondary | ICD-10-CM | POA: Diagnosis not present

## 2019-12-12 DIAGNOSIS — Z87891 Personal history of nicotine dependence: Secondary | ICD-10-CM | POA: Diagnosis not present

## 2019-12-12 DIAGNOSIS — Z3A39 39 weeks gestation of pregnancy: Secondary | ICD-10-CM | POA: Diagnosis not present

## 2019-12-12 SURGERY — Surgical Case
Anesthesia: Spinal | Laterality: Bilateral

## 2019-12-12 MED ORDER — MENTHOL 3 MG MT LOZG: 1.0000 | LOZENGE | OROMUCOSAL | Status: DC | PRN

## 2019-12-12 MED ORDER — CLINDAMYCIN PHOSPHATE 900 MG/50ML IV SOLN
INTRAVENOUS | Status: AC
  Filled 2019-12-12: qty 50

## 2019-12-12 MED ORDER — SIMETHICONE 80 MG PO CHEW
80.0000 mg | CHEWABLE_TABLET | Freq: Three times a day (TID) | ORAL | Status: DC
  Administered 2019-12-12 – 2019-12-14 (×6): 80 mg via ORAL
  Filled 2019-12-12 (×7): qty 1

## 2019-12-12 MED ORDER — HYDROMORPHONE HCL 1 MG/ML IJ SOLN: 0.2500 mg | INTRAMUSCULAR | Status: DC | PRN

## 2019-12-12 MED ORDER — OXYTOCIN-SODIUM CHLORIDE 30-0.9 UT/500ML-% IV SOLN
INTRAVENOUS | Status: DC | PRN
  Administered 2019-12-12 (×2): 150 mL via INTRAVENOUS

## 2019-12-12 MED ORDER — MEPERIDINE HCL 25 MG/ML IJ SOLN: 6.2500 mg | INTRAMUSCULAR | Status: DC | PRN

## 2019-12-12 MED ORDER — POVIDONE-IODINE 10 % EX SWAB
2.0000 "application " | Freq: Once | CUTANEOUS | Status: AC
  Administered 2019-12-12: 2 via TOPICAL

## 2019-12-12 MED ORDER — DEXAMETHASONE SODIUM PHOSPHATE 4 MG/ML IJ SOLN
INTRAMUSCULAR | Status: AC
  Filled 2019-12-12: qty 1

## 2019-12-12 MED ORDER — ZOLPIDEM TARTRATE 5 MG PO TABS: 5.0000 mg | ORAL_TABLET | Freq: Every evening | ORAL | Status: DC | PRN

## 2019-12-12 MED ORDER — TETANUS-DIPHTH-ACELL PERTUSSIS 5-2.5-18.5 LF-MCG/0.5 IM SUSP: 0.5000 mL | Freq: Once | INTRAMUSCULAR | Status: DC

## 2019-12-12 MED ORDER — SODIUM CHLORIDE 0.9 % IR SOLN
Status: DC | PRN
  Administered 2019-12-12: 1000 mL

## 2019-12-12 MED ORDER — NALBUPHINE HCL 10 MG/ML IJ SOLN: 5.0000 mg | Freq: Once | INTRAMUSCULAR | Status: DC | PRN

## 2019-12-12 MED ORDER — GABAPENTIN 300 MG PO CAPS
300.0000 mg | ORAL_CAPSULE | ORAL | Status: AC
  Administered 2019-12-12: 300 mg via ORAL

## 2019-12-12 MED ORDER — BUPIVACAINE IN DEXTROSE 0.75-8.25 % IT SOLN
INTRATHECAL | Status: DC | PRN
  Administered 2019-12-12: 1.6 mL via INTRATHECAL

## 2019-12-12 MED ORDER — NALBUPHINE HCL 10 MG/ML IJ SOLN: 5.0000 mg | INTRAMUSCULAR | Status: DC | PRN

## 2019-12-12 MED ORDER — NALOXONE HCL 4 MG/10ML IJ SOLN
1.0000 ug/kg/h | INTRAVENOUS | Status: DC | PRN
  Filled 2019-12-12: qty 5

## 2019-12-12 MED ORDER — DIPHENHYDRAMINE HCL 50 MG/ML IJ SOLN: 12.5000 mg | Freq: Four times a day (QID) | INTRAMUSCULAR | Status: DC | PRN

## 2019-12-12 MED ORDER — DIBUCAINE (PERIANAL) 1 % EX OINT: 1.0000 "application " | TOPICAL_OINTMENT | CUTANEOUS | Status: DC | PRN

## 2019-12-12 MED ORDER — DIPHENHYDRAMINE HCL 25 MG PO CAPS
25.0000 mg | ORAL_CAPSULE | ORAL | Status: DC | PRN
  Administered 2019-12-12: 25 mg via ORAL
  Filled 2019-12-12 (×2): qty 1

## 2019-12-12 MED ORDER — OXYTOCIN-SODIUM CHLORIDE 30-0.9 UT/500ML-% IV SOLN: 2.5000 [IU]/h | INTRAVENOUS | Status: AC

## 2019-12-12 MED ORDER — PRENATAL MULTIVITAMIN CH
1.0000 | ORAL_TABLET | Freq: Every day | ORAL | Status: DC
  Administered 2019-12-13 – 2019-12-14 (×2): 1 via ORAL
  Filled 2019-12-12 (×2): qty 1

## 2019-12-12 MED ORDER — OXYCODONE HCL 5 MG/5ML PO SOLN: 5.0000 mg | Freq: Once | ORAL | Status: DC | PRN

## 2019-12-12 MED ORDER — OXYCODONE HCL 5 MG PO TABS
5.0000 mg | ORAL_TABLET | ORAL | Status: DC | PRN
  Administered 2019-12-12 – 2019-12-13 (×4): 5 mg via ORAL
  Administered 2019-12-13: 10 mg via ORAL
  Administered 2019-12-14 (×3): 5 mg via ORAL
  Administered 2019-12-15: 10 mg via ORAL
  Filled 2019-12-12: qty 1
  Filled 2019-12-12: qty 2
  Filled 2019-12-12 (×6): qty 1
  Filled 2019-12-12: qty 2

## 2019-12-12 MED ORDER — MORPHINE SULFATE (PF) 0.5 MG/ML IJ SOLN
INTRAMUSCULAR | Status: AC
  Filled 2019-12-12: qty 10

## 2019-12-12 MED ORDER — NALOXONE HCL 0.4 MG/ML IJ SOLN: 0.4000 mg | INTRAMUSCULAR | Status: DC | PRN

## 2019-12-12 MED ORDER — LACTATED RINGERS IV SOLN: INTRAVENOUS | Status: DC

## 2019-12-12 MED ORDER — OXYCODONE HCL 5 MG PO TABS: 5.0000 mg | ORAL_TABLET | Freq: Once | ORAL | Status: DC | PRN

## 2019-12-12 MED ORDER — ACETAMINOPHEN 500 MG PO TABS
1000.0000 mg | ORAL_TABLET | Freq: Four times a day (QID) | ORAL | Status: DC
  Administered 2019-12-12 – 2019-12-15 (×4): 1000 mg via ORAL
  Filled 2019-12-12 (×7): qty 2

## 2019-12-12 MED ORDER — MORPHINE SULFATE (PF) 0.5 MG/ML IJ SOLN
INTRAMUSCULAR | Status: DC | PRN
Start: 2019-12-12 — End: 2019-12-12
  Administered 2019-12-12: 150 ug via INTRATHECAL

## 2019-12-12 MED ORDER — ONDANSETRON HCL 4 MG/2ML IJ SOLN
INTRAMUSCULAR | Status: DC | PRN
  Administered 2019-12-12: 4 mg via INTRAVENOUS

## 2019-12-12 MED ORDER — ONDANSETRON HCL 4 MG/2ML IJ SOLN: 4.0000 mg | Freq: Three times a day (TID) | INTRAMUSCULAR | Status: DC | PRN

## 2019-12-12 MED ORDER — SCOPOLAMINE 1 MG/3DAYS TD PT72
MEDICATED_PATCH | TRANSDERMAL | Status: AC
  Filled 2019-12-12: qty 1

## 2019-12-12 MED ORDER — FENTANYL CITRATE (PF) 100 MCG/2ML IJ SOLN
INTRAMUSCULAR | Status: DC | PRN
Start: 2019-12-12 — End: 2019-12-12
  Administered 2019-12-12: 15 ug via INTRATHECAL

## 2019-12-12 MED ORDER — FENTANYL CITRATE (PF) 100 MCG/2ML IJ SOLN
INTRAMUSCULAR | Status: AC
  Filled 2019-12-12: qty 2

## 2019-12-12 MED ORDER — GABAPENTIN 300 MG PO CAPS
ORAL_CAPSULE | ORAL | Status: AC
  Filled 2019-12-12: qty 1

## 2019-12-12 MED ORDER — DIPHENHYDRAMINE HCL 25 MG PO CAPS: 25.0000 mg | ORAL_CAPSULE | Freq: Four times a day (QID) | ORAL | Status: DC | PRN

## 2019-12-12 MED ORDER — WITCH HAZEL-GLYCERIN EX PADS: 1.0000 "application " | MEDICATED_PAD | CUTANEOUS | Status: DC | PRN

## 2019-12-12 MED ORDER — SIMETHICONE 80 MG PO CHEW: 80.0000 mg | CHEWABLE_TABLET | ORAL | Status: DC | PRN

## 2019-12-12 MED ORDER — SIMETHICONE 80 MG PO CHEW
80.0000 mg | CHEWABLE_TABLET | ORAL | Status: DC
  Administered 2019-12-13 – 2019-12-15 (×3): 80 mg via ORAL
  Filled 2019-12-12 (×3): qty 1

## 2019-12-12 MED ORDER — SENNOSIDES-DOCUSATE SODIUM 8.6-50 MG PO TABS
2.0000 | ORAL_TABLET | ORAL | Status: DC
  Administered 2019-12-13 – 2019-12-15 (×3): 2 via ORAL
  Filled 2019-12-12 (×3): qty 2

## 2019-12-12 MED ORDER — KETOROLAC TROMETHAMINE 30 MG/ML IJ SOLN
30.0000 mg | Freq: Once | INTRAMUSCULAR | Status: AC
  Administered 2019-12-12: 30 mg via INTRAVENOUS

## 2019-12-12 MED ORDER — SCOPOLAMINE 1 MG/3DAYS TD PT72
1.0000 | MEDICATED_PATCH | Freq: Once | TRANSDERMAL | Status: AC
  Administered 2019-12-12: 1.5 mg via TRANSDERMAL

## 2019-12-12 MED ORDER — PROMETHAZINE HCL 25 MG/ML IJ SOLN: 6.2500 mg | INTRAMUSCULAR | Status: DC | PRN

## 2019-12-12 MED ORDER — PHENYLEPHRINE HCL-NACL 20-0.9 MG/250ML-% IV SOLN
INTRAVENOUS | Status: DC | PRN
  Administered 2019-12-12: 60 ug/min via INTRAVENOUS

## 2019-12-12 MED ORDER — KETOROLAC TROMETHAMINE 30 MG/ML IJ SOLN
INTRAMUSCULAR | Status: AC
  Filled 2019-12-12: qty 1

## 2019-12-12 MED ORDER — SODIUM CHLORIDE 0.9% FLUSH: 3.0000 mL | INTRAVENOUS | Status: DC | PRN

## 2019-12-12 MED ORDER — IBUPROFEN 800 MG PO TABS
800.0000 mg | ORAL_TABLET | Freq: Three times a day (TID) | ORAL | Status: DC
  Administered 2019-12-12 – 2019-12-15 (×8): 800 mg via ORAL
  Filled 2019-12-12 (×8): qty 1

## 2019-12-12 MED ORDER — DEXAMETHASONE SODIUM PHOSPHATE 4 MG/ML IJ SOLN
INTRAMUSCULAR | Status: DC | PRN
  Administered 2019-12-12: 5 mg via INTRAVENOUS

## 2019-12-12 MED ORDER — CLINDAMYCIN PHOSPHATE 900 MG/50ML IV SOLN
900.0000 mg | INTRAVENOUS | Status: AC
  Administered 2019-12-12: 900 mg via INTRAVENOUS

## 2019-12-12 MED ORDER — ACETAMINOPHEN 10 MG/ML IV SOLN
INTRAVENOUS | Status: AC
  Filled 2019-12-12: qty 100

## 2019-12-12 MED ORDER — ACETAMINOPHEN 10 MG/ML IV SOLN
1000.0000 mg | Freq: Once | INTRAVENOUS | Status: DC | PRN
  Administered 2019-12-12: 1000 mg via INTRAVENOUS

## 2019-12-12 MED ORDER — COCONUT OIL OIL: 1.0000 "application " | TOPICAL_OIL | Status: DC | PRN

## 2019-12-12 MED ORDER — STERILE WATER FOR IRRIGATION IR SOLN
Status: DC | PRN
  Administered 2019-12-12: 1000 mL

## 2019-12-12 MED ORDER — ONDANSETRON HCL 4 MG/2ML IJ SOLN
INTRAMUSCULAR | Status: AC
  Filled 2019-12-12: qty 2

## 2019-12-12 MED ORDER — CIPROFLOXACIN IN D5W 400 MG/200ML IV SOLN
400.0000 mg | INTRAVENOUS | Status: AC
  Administered 2019-12-12: 400 mg via INTRAVENOUS
  Filled 2019-12-12: qty 200

## 2019-12-12 MED ORDER — PHENYLEPHRINE HCL-NACL 20-0.9 MG/250ML-% IV SOLN
INTRAVENOUS | Status: AC
  Filled 2019-12-12: qty 250

## 2019-12-12 SURGICAL SUPPLY — 35 items
BENZOIN TINCTURE PRP APPL 2/3 (GAUZE/BANDAGES/DRESSINGS) ×2 IMPLANT
CHLORAPREP W/TINT 26ML (MISCELLANEOUS) ×2 IMPLANT
CLAMP CORD UMBIL (MISCELLANEOUS) IMPLANT
CLOTH BEACON ORANGE TIMEOUT ST (SAFETY) ×2 IMPLANT
DRAPE C SECTION CLR SCREEN (DRAPES) ×2 IMPLANT
DRSG OPSITE POSTOP 4X10 (GAUZE/BANDAGES/DRESSINGS) ×2 IMPLANT
ELECT REM PT RETURN 9FT ADLT (ELECTROSURGICAL) ×2
ELECTRODE REM PT RTRN 9FT ADLT (ELECTROSURGICAL) ×1 IMPLANT
EXTRACTOR VACUUM KIWI (MISCELLANEOUS) IMPLANT
GLOVE BIO SURGEON STRL SZ 6.5 (GLOVE) ×2 IMPLANT
GLOVE BIOGEL PI IND STRL 7.0 (GLOVE) ×2 IMPLANT
GLOVE BIOGEL PI INDICATOR 7.0 (GLOVE) ×2
GOWN STRL REUS W/TWL LRG LVL3 (GOWN DISPOSABLE) ×4 IMPLANT
KIT ABG SYR 3ML LUER SLIP (SYRINGE) IMPLANT
NEEDLE HYPO 25X5/8 SAFETYGLIDE (NEEDLE) IMPLANT
NS IRRIG 1000ML POUR BTL (IV SOLUTION) ×2 IMPLANT
PACK C SECTION WH (CUSTOM PROCEDURE TRAY) ×2 IMPLANT
PAD OB MATERNITY 4.3X12.25 (PERSONAL CARE ITEMS) ×2 IMPLANT
RETRACTOR WND ALEXIS 25 LRG (MISCELLANEOUS) ×1 IMPLANT
RTRCTR C-SECT PINK 25CM LRG (MISCELLANEOUS) IMPLANT
RTRCTR WOUND ALEXIS 25CM LRG (MISCELLANEOUS) ×2
STRIP CLOSURE SKIN 1/2X4 (GAUZE/BANDAGES/DRESSINGS) ×2 IMPLANT
SUT CHROMIC 1 CTX 36 (SUTURE) ×4 IMPLANT
SUT PLAIN 0 NONE (SUTURE) IMPLANT
SUT PLAIN 2 0 XLH (SUTURE) ×2 IMPLANT
SUT VIC AB 0 CT1 27 (SUTURE) ×4
SUT VIC AB 0 CT1 27XBRD ANBCTR (SUTURE) ×2 IMPLANT
SUT VIC AB 2-0 CT1 27 (SUTURE) ×2
SUT VIC AB 2-0 CT1 TAPERPNT 27 (SUTURE) ×1 IMPLANT
SUT VIC AB 3-0 CT1 27 (SUTURE)
SUT VIC AB 3-0 CT1 TAPERPNT 27 (SUTURE) IMPLANT
SUT VIC AB 4-0 KS 27 (SUTURE) ×2 IMPLANT
TOWEL OR 17X24 6PK STRL BLUE (TOWEL DISPOSABLE) ×2 IMPLANT
TRAY FOLEY W/BAG SLVR 14FR LF (SET/KITS/TRAYS/PACK) ×2 IMPLANT
WATER STERILE IRR 1000ML POUR (IV SOLUTION) ×2 IMPLANT

## 2019-12-12 NOTE — H&P (Signed)
Brandy Ferguson is a 34 y.o. female presenting for scheduled repeat cesarean section and bilateral tubal ligation. Pt has as hx of c/s x 2. Declined TOLAC. She reports complete family - aware of reversible contraceptive options.  Dated per LMP and 8week [place]. Pt is AMA nad had hyperemesis in pregnancy. 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 She received flu, TDap and covid vaccines.  OB History    Gravida  3   Para  2   Term  2   Preterm      AB      Living  2     SAB      TAB      Ectopic      Multiple  0   Live Births  2          Past Medical History:  Diagnosis Date  . Anemia   . DOE (dyspnea on exertion)   . GERD (gastroesophageal reflux disease)    with pregnancy  . Gestational thrombocytopenia (HCC)   . Rapid heart beat    Past Surgical History:  Procedure Laterality Date  . APPENDECTOMY    . CESAREAN SECTION    . CESAREAN SECTION N/A 03/06/2015   Procedure: CESAREAN SECTION;  Surgeon: Carrington Clamp, MD;  Location: [facility];  Service: Obstetrics;  Laterality: N/A;  EDD: 03/10/15  . WISDOM TOOTH EXTRACTION     Family History: family history includes Cervical cancer in her paternal aunt; Heart attack in her mother; Heart disease in her mother; Hypertension in her mother; Kidney disease in her father and paternal grandmother. Social History:  reports that she quit smoking about 5 years ago. Her smoking use included cigarettes. She has a 3.50 pack-year smoking history. She has never used smokeless tobacco. She reports previous alcohol use. She reports that she does not use drugs.     Maternal Diabetes: No Genetic Screening: Abnormal:  Results: Other:  SMA carrier Maternal Ultrasounds/Referrals: Normal Fetal Ultrasounds or other Referrals:  None Maternal Substance Abuse:  No Significant Maternal Medications:  None Significant Maternal Lab Results:  Group B Strep negative Other Comments:  None  Review of Systems  Constitutional: Negative for activity change, appetite  change and fatigue.  Eyes: Negative for photophobia and visual disturbance.  Respiratory: Negative for shortness of breath and wheezing.   Cardiovascular: Negative for chest pain, palpitations and leg swelling.  Gastrointestinal: Negative for abdominal pain.  Genitourinary: Positive for pelvic pain.  Allergic/Immunologic: Negative for immunocompromised state.  Neurological: Negative for headaches.  Psychiatric/Behavioral: The patient is not nervous/anxious.    Maternal Medical History:  Reason for admission: Planned repeat c/s and BTL  Contractions: Perceived severity is mild.    Fetal activity: Perceived fetal activity is normal.    Prenatal complications: no prenatal complications Prenatal Complications - Diabetes: none.      Blood pressure (!) 123/95, pulse 80, temperature 97.8 F (36.6 C), temperature source Oral, resp. rate 18, height 5\' 3"  (1.6 m), weight 77.7 kg, last menstrual period 03/10/2019, unknown if currently breastfeeding. Maternal Exam:  Abdomen: Patient reports generalized tenderness.  Estimated fetal weight is AGA.   Fetal presentation: vertex     Fetal Exam Fetal Monitor Review: Baseline rate: 141.      Physical Exam Vitals and nursing note reviewed. Exam conducted with a chaperone present.  Constitutional:      Appearance: Normal appearance.  Cardiovascular:     Rate and Rhythm: Normal rate.  Pulmonary:     Effort: Pulmonary effort is normal.  Abdominal:  Tenderness: There is generalized abdominal tenderness.  Musculoskeletal:        General: Normal range of motion.     Cervical back: Normal range of motion.  Skin:    General: Skin is warm.     Capillary Refill: Capillary refill takes 2 to 3 seconds.  Neurological:     General: No focal deficit present.     Mental Status: She is alert.  Psychiatric:        Mood and Affect: Mood normal.     Prenatal labs: ABO, Rh: --/--/O POS (10/04 5784) Antibody: NEG (10/04 0841) Rubella:  Immune (04/08 0000) RPR: NON REACTIVE (10/04 0841)  HBsAg: Negative (04/08 0000)  HIV: Non-reactive (04/08 0000)  GBS: Negative/-- (09/14 0000)   Assessment/Plan: Admit - ERAS protocol - Covid screen neg - Consent confirmed  - TO or when ready   Janean Sark Teondre Jarosz 12/12/2019, 8:31 AM

## 2019-12-12 NOTE — Op Note (Signed)
Operative Note   Preoperative Diagnosis:   1. IUP  at 39 4/7wks  2. Previous c/s x 2                      3. Complete family   Postoperative Diagnosis: Same   Procedure: Repeat low transverse cesarean section with bilateral tubal ligation ( fimbriectomy)   Surgeon: Britt Bottom DO Assist: Genice Rouge RNFA  Anesthesia: Spinal   Fluids: LR 1800 EBL: UOP:   Findings: Viable female infant in vertex position. Apgars 9,9; weight pending Grossly normal uterus,tubes and ovaries   Specimen: Placenta to L/D   Procedure Note Patient was taken to the operating room. Spinal anesthesia was administered with no complications. She was prepped and draped in the normal sterile fashion in the dorsal supine position with a leftward tilt. An appropriate time out was performed. An allis clamp test was performed and anesthesia was found to be adequate.  A Pfannenstiel skin incision was then made through the previous incision with the scalpel and carried through to the underlying layer of fascia by sharp dissection. The fascia was nicked in the midline and the incision was extended laterally with Mayo scissors. The superior aspect of the incision was grasped kocher clamps and dissected off the underlying rectus muscles. In a similar fashion the inferior aspect was dissected off the rectus muscles. Rectus muscles were separated in the midline and the peritoneal cavity entered bluntly. The peritoneal incision was then extended both superiorly and inferiorly with careful attention to avoid both bowel and bladder. The Alexis self-retaining wound retractor was then placed within the incision and the lower uterine segment exposed. The bladder flap was developed with Metzenbaum scissors and pushed away from the lower uterine segment. The lower uterine segment was then incised in a transverse fashion and the cavity itself entered bluntly. Clear amniotic fluid was noted.  The incision was extended bluntly.  The infant's head was then lifted and delivered from the incision; bulb suction of mouth and nose were performed. The remainder of the infant delivered easily and after a minute, the cord clamped and cut. The infant was handed off to the waiting NICU team. Cord blood was obtained. The placenta was then spontaneously expressed from the uterus and the uterus cleared of all clots and debris with moist lap sponge. The uterine incision was then repaired first in a single a layer with a  running locked layer 0 chromic suture. A second imbricated layer was placed using the same suture. Next, the left and then right tubes were grasped with a babcock in the mid isthmus region. The distal ends of the fallopian tubes were suture ligated and excised. Excellent hemostasis was appreciated. The  ovaries were inspected and found to be grossly normal. The gutters were  cleared of all clots and debris. The uterine incision was inspected again and found to be hemostatic. All instruments and sponges as well as the Alexis retractor were then removed from the abdomen. The peritoneum was reapproximated with 2-0 Vicryl in a pursestring fashion then the rectus in a large figure 8 stitch.  The fascia was then closed with 0 Vicryl in a running fashion. Subcutaneous tissue layer was thin so did not need to be reapproximated. It was undermined for better cosmesis . The skin was closed with a subcuticular stitch of 4-0 Vicryl on a Keith needle and then reinforced with benzoin and Steri-Strips. At the conclusion of the procedure all instruments and sponge counts  were correct. Patient was taken to the recovery room in good condition with her baby accompanying her skin to skin.

## 2019-12-12 NOTE — Anesthesia Procedure Notes (Signed)
Spinal  Patient location during procedure: OR Start time: 12/12/2019 8:40 AM End time: 12/12/2019 8:45 AM Staffing Performed: anesthesiologist  Anesthesiologist: Leonides Grills, MD Preanesthetic Checklist Completed: patient identified, IV checked, risks and benefits discussed, surgical consent, monitors and equipment checked, pre-op evaluation and timeout performed Spinal Block Patient position: sitting Prep: DuraPrep Patient monitoring: cardiac monitor, continuous pulse ox and blood pressure Approach: midline Location: L3-4 Injection technique: single-shot Needle Needle type: Pencan  Needle gauge: 24 G Needle length: 9 cm Assessment Sensory level: T10 Additional Notes Functioning IV was confirmed and monitors were applied. Sterile prep and drape, including hand hygiene and sterile gloves were used. The patient was positioned and the spine was prepped. The skin was anesthetized with lidocaine just above a lumbar spine tatoo.  Free flow of clear CSF was obtained prior to injecting local anesthetic into the CSF.  The spinal needle aspirated freely following injection.  The needle was carefully withdrawn.  The patient tolerated the procedure well.

## 2019-12-12 NOTE — Anesthesia Postprocedure Evaluation (Signed)
Anesthesia Post Note  Patient: Elverna Caffee Jones-Cherry  Procedure(s) Performed: CESAREAN SECTION WITH BILATERAL TUBAL LIGATION (Bilateral )     Patient location during evaluation: PACU Anesthesia Type: Spinal Level of consciousness: oriented and awake and alert Pain management: pain level controlled Vital Signs Assessment: post-procedure vital signs reviewed and stable Respiratory status: spontaneous breathing, respiratory function stable and patient connected to nasal cannula oxygen Cardiovascular status: blood pressure returned to baseline and stable Postop Assessment: no headache, no backache, no apparent nausea or vomiting and spinal receding Anesthetic complications: no   No complications documented.  Last Vitals:  Vitals:   12/12/19 1106 12/12/19 1215  BP: 104/78 122/87  Pulse: 68 64  Resp: 16 14  Temp: 36.4 C 36.6 C  SpO2: 94% 100%    Last Pain:  Vitals:   12/12/19 1215  TempSrc: Oral  PainSc: 0-No pain   Pain Goal: Patients Stated Pain Goal: 4 (12/12/19 0711)                 Catheryn Bacon Seanne Chirico

## 2019-12-12 NOTE — Transfer of Care (Signed)
Immediate Anesthesia Transfer of Care Note  Patient: Brandy Ferguson  Procedure(s) Performed: CESAREAN SECTION WITH BILATERAL TUBAL LIGATION (Bilateral )  Patient Location: PACU  Anesthesia Type:Spinal  Level of Consciousness: awake, alert  and oriented  Airway & Oxygen Therapy: Patient Spontanous Breathing  Post-op Assessment: Report given to RN and Post -op Vital signs reviewed and stable  Post vital signs: Reviewed and stable  Last Vitals:  Vitals Value Taken Time  BP 99/71 12/12/19 1000  Temp    Pulse 78 12/12/19 1002  Resp 16 12/12/19 1002  SpO2 98 % 12/12/19 1002  Vitals shown include unvalidated device data.  Last Pain:  Vitals:   12/12/19 0711  TempSrc: Oral  PainSc: 0-No pain      Patients Stated Pain Goal: 4 (12/12/19 0711)  Complications: No complications documented.

## 2019-12-13 LAB — CBC
HCT: 31.2 % — ABNORMAL LOW (ref 36.0–46.0)
Hemoglobin: 10.2 g/dL — ABNORMAL LOW (ref 12.0–15.0)
MCH: 30.6 pg (ref 26.0–34.0)
MCHC: 32.7 g/dL (ref 30.0–36.0)
MCV: 93.7 fL (ref 80.0–100.0)
Platelets: 151 10*3/uL (ref 150–400)
RBC: 3.33 MIL/uL — ABNORMAL LOW (ref 3.87–5.11)
RDW: 13.8 % (ref 11.5–15.5)
WBC: 10.9 10*3/uL — ABNORMAL HIGH (ref 4.0–10.5)
nRBC: 0 % (ref 0.0–0.2)

## 2019-12-13 LAB — SURGICAL PATHOLOGY

## 2019-12-13 LAB — BIRTH TISSUE RECOVERY COLLECTION (PLACENTA DONATION)

## 2019-12-13 NOTE — Progress Notes (Signed)
Subjective: Postpartum Day 1: Cesarean Delivery Patient reports incisional pain, tolerating PO and no problems voiding.    Objective: Vital signs in last 24 hours: Temp:  [97.5 F (36.4 C)-98.7 F (37.1 C)] 98.2 F (36.8 C) (10/07 0534) Pulse Rate:  [60-82] 69 (10/07 0534) Resp:  [13-18] 18 (10/07 0534) BP: (99-122)/(68-87) 105/75 (10/07 0534) SpO2:  [94 %-100 %] 100 % (10/07 0534)  Physical Exam:  General: alert and no distress Lochia: appropriate Uterine Fundus: firm Incision: healing well DVT Evaluation: No evidence of DVT seen on physical exam.  Recent Labs    12/10/19 0841 12/13/19 0535  HGB 10.6* 10.2*  HCT 33.4* 31.2*    Assessment/Plan: Status post Cesarean section. Doing well postoperatively.  Continue current care. Bottle feeding, desires circumcision for female infant - d/w pt r/b/a and process - will proceed when able Brandy Ferguson 12/13/2019, 8:22 AM

## 2019-12-13 NOTE — Clinical Social Work Maternal (Signed)
CLINICAL SOCIAL WORK MATERNAL/CHILD NOTE  Patient Details  Name: Brandy Ferguson MRN: [id number] Date of Birth: 12/05/1985  Date:  05-30-19  Clinical Social Worker Initiating Note:  Darra Lis, [place] Date/Time: Initiated:  12/13/19/0900     Child's Name:  Brandy Ferguson   Biological Parents:  Mother   Need for Interpreter:  None   Reason for Referral:  Current Substance Use/Substance Use During Pregnancy    Address:  [place] [address]    Phone number:  [phone] (home)     Additional phone number:   Household Members/Support Persons (HM/SP):   Household Member/Support Person 1, Household Member/Support Person 2, Household Member/Support Person 3   HM/SP Name Relationship DOB or Age  HM/SP -1 Brandy Ferguson Significant Other 04/14/1983  HM/SP -2 Brandy Ferguson Daughter 05/11/2004  HM/SP -[name] Daughter 03/06/2015  HM/SP -4        HM/SP -5        HM/SP -6        HM/SP -7        HM/SP -8          Natural Supports (not living in the home):  Immediate Family   Professional Supports: None   Employment: Full-time   Type of Work: [place]   Education:  [place] arranged:    Museum/gallery curator Resources:  [employer]   Other Resources:  [employer], Physicist, medical    Cultural/Religious Considerations Which May Impact Care:    Strengths:  Ability to meet basic needs , Home prepared for child , Pediatrician chosen   Psychotropic Medications:         Pediatrician:    [place]  Pediatrician List:   [place]      Pediatrician Fax Number:    Risk Factors/Current Problems:  Substance Use    Cognitive State:  Insightful , Alert    Mood/Affect:  Calm , Happy , Interested    CSW Assessment: CSW consulted for drug exposed infant. Per prenatal notes, MOB UDS positive for THC in August.  CSW met with MOB to complete assessment and offer support. CSW introduced self and role. Baby was not in room due to being in circumcision procedure. CSW observed visitor (daughter) sleeping on couch. MOB declined having visitor leave the room for assessment. CSW informed MOB of reason for consult, MOB expressed understanding.   MOB disclosed she last used [facility] in August to help with appetite, due to being sick and losing weight. MOB expressed that Is the only substance she used. CSW informed MOB of hospital drug screen policy. CSW informed MOB a CDS and UDS is completed on baby. CSW informed MOB UDS resulted negative. CSW informed MOB that a CPS report will be required if CDS reports positive. MOB stated she has no previous or current CPS involvement. MOB expressed understanding and declined having any additional questions regarding the drug screen policy.   MOB resides with FOB Tretha Ferguson and her two older children. MOB is employed and receives [employer] and food stamps. MOB denies any mental health history. MOB expressed she is not experiencing any SI, HI or involved in a DV relationship.  CSW provided education regarding the baby blues period vs. perinatal mood disorders, discussed treatment and gave resources for mental health follow up if concerns arise.  CSW recommends  self-evaluation during the postpartum time period using the New Mom Checklist from Postpartum Progress and encouraged MOB to contact a medical professional if symptoms are noted at any time. MOB stated she did not experience PPD with any of her previous children.   CSW provided review of Sudden Infant Death Syndrome (SIDS) precautions.  MOB expressed baby will sleep in a basinet once discharge home. MOB stated she has all of the essential needs for baby to discharge home, including a brand new carseat. Baby will receive follow-up care at [facility]. MOB denies any transportation barriers.   CSW will continue to follow CDS and make a  CPS report if warranted. CSW identifies no further need for intervention and no barriers to discharge at this time.   CSW Plan/Description:  No Further Intervention Required/No Barriers to Discharge, Perinatal Mood and Anxiety Disorder (PMADs) Education, Hospital Drug Screen Policy Information, Child Protective Service Report , CSW Will Continue to Monitor Umbilical Cord Tissue Drug Screen Results and Make Report if Warranted, Sudden Infant Death Syndrome (SIDS) Education    Brandy Ferguson J Brandy Ferguson, LCSWA 12/13/2019, 9:23 AM 

## 2019-12-14 NOTE — Progress Notes (Signed)
POD #2 LTCS Doing well, pain ok Afeb, VSS Abd- soft, fundus firm, incision intact Continue routine care 

## 2019-12-15 MED ORDER — OXYCODONE HCL 5 MG PO TABS
5.0000 mg | ORAL_TABLET | ORAL | 0 refills | Status: AC | PRN
Start: 2019-12-15 — End: ?

## 2019-12-15 MED ORDER — IBUPROFEN 800 MG PO TABS: 800.0000 mg | ORAL_TABLET | Freq: Three times a day (TID) | ORAL | 0 refills | Status: AC

## 2019-12-15 NOTE — Progress Notes (Signed)
POD #3 LTCS Doing well, ready to go home Afeb, VSS Abd- soft, fundus firm, incision intact D/c home 

## 2019-12-15 NOTE — Discharge Summary (Signed)
Postpartum Discharge Summary      Patient Name: Brandy Ferguson DOB: [birth year] MRN: [id number]  Date of admission: 12/12/2019 Delivery date:12/12/2019  Delivering provider: Edwinna Areola  Date of discharge: 12/15/2019  Admitting diagnosis: Postpartum care following cesarean delivery [Z39.2] Intrauterine pregnancy: [redacted]w[redacted]d     Secondary diagnosis:  Active Problems:   Postpartum care following cesarean delivery     Discharge diagnosis: Term Pregnancy Delivered                                              Hospital course: Sceduled C/S   34 y.o. yo G3P3003 at [redacted]w[redacted]d was admitted to the hospital 12/12/2019 for scheduled cesarean section with the following indication:Elective Repeat.Delivery details are as follows:  Membrane Rupture Time/Date: 9:12 AM ,12/12/2019   Delivery Method:C-Section, Low Transverse  Details of operation can be found in separate operative note.  Patient had an uncomplicated postpartum course.  She is ambulating, tolerating a regular diet, passing flatus, and urinating well. Patient is discharged home in stable condition on  12/15/19        Newborn Data: Birth date:12/12/2019  Birth time:9:13 AM  Gender:Female  Living status:Living  Apgars:9 ,10  Weight:3365 g      Physical exam  Vitals:   12/14/19 0429 12/14/19 1414 12/14/19 2218 12/15/19 0550  BP: 109/77 114/77 124/84 116/83  Pulse: 70 73 72 68  Resp: 16 16 14 18   Temp: 97.7 F (36.5 C)  98.1 F (36.7 C) 98.1 F (36.7 C)  TempSrc: Oral  Oral Oral  SpO2:  99% 100% 100%  Weight:      Height:       General: alert Lochia: appropriate Uterine Fundus: firm Incision: Healing well with no significant drainage  Labs: Lab Results  Component Value Date   WBC 10.9 (H) 12/13/2019   HGB 10.2 (L) 12/13/2019   HCT 31.2 (L) 12/13/2019   MCV 93.7 12/13/2019   PLT 151 12/13/2019   CMP Latest Ref Rng & Units 10/22/2019  Glucose 70 - 99 mg/dL 74  BUN 6 - 20 mg/dL 5(L)  Creatinine 10/24/2019 - 1.00 mg/dL  2.42  Sodium 6.83 - 419 mmol/L 133(L)  Potassium 3.5 - 5.1 mmol/L 3.2(L)  Chloride 98 - 111 mmol/L 102  CO2 22 - 32 mmol/L 20(L)  Calcium 8.9 - 10.3 mg/dL 622)  Total Protein 6.5 - 8.1 g/dL -  Total Bilirubin 0.3 - 1.2 mg/dL -  Alkaline Phos 38 - 2.9(N U/L -  AST 15 - 41 U/L -  ALT 0 - 44 U/L -   Edinburgh Score: Edinburgh Postnatal Depression Scale Screening Tool 12/12/2019  I have been able to laugh and see the funny side of things. 0  I have looked forward with enjoyment to things. 0  I have blamed myself unnecessarily when things went wrong. 0  I have been anxious or worried for no good reason. 0  I have felt scared or panicky for no good reason. 0  Things have been getting on top of me. 1  I have been so unhappy that I have had difficulty sleeping. 0  I have felt sad or miserable. 0  I have been so unhappy that I have been crying. 0  The thought of harming myself has occurred to me. 0  Edinburgh Postnatal Depression Scale Total 1  After visit meds:  Allergies as of 12/15/2019      Reactions   Bactrim [sulfamethoxazole-trimethoprim] Hives   Flagyl [metronidazole] Hives   Lubricants Hives   KY jelly causes itching and hives   Nonoxynol 9 Hives   Penicillins Other (See Comments)   Has patient had a PCN reaction causing immediate rash, facial/tongue/throat swelling, SOB or lightheadedness with hypotension: Yes Has patient had a PCN reaction causing severe rash involving mucus membranes or skin necrosis: No Has patient had a PCN reaction that required hospitalization Yes Has patient had a PCN reaction occurring within the last 10 years: No If all of the above answers are "NO", then may proceed with Cephalosporin use. Pt reports taking Amoxicillin without any adverse effects      Medication List    STOP taking these medications   famotidine 20 MG tablet Commonly known as: PEPCID   scopolamine 1 MG/3DAYS Commonly known as: TRANSDERM-SCOP     TAKE these  medications   cyclobenzaprine 5 MG tablet Commonly known as: FLEXERIL Take 1 tablet (5 mg total) by mouth 3 (three) times daily as needed for muscle spasms.   ibuprofen 800 MG tablet Commonly known as: ADVIL Take 1 tablet (800 mg total) by mouth every 8 (eight) hours.   ondansetron 4 MG disintegrating tablet Commonly known as: Zofran ODT Take 1 tablet (4 mg total) by mouth every 6 (six) hours as needed for nausea.   oxyCODONE 5 MG immediate release tablet Commonly known as: Oxy IR/ROXICODONE Take 1 tablet (5 mg total) by mouth every 4 (four) hours as needed for severe pain.        Discharge home in stable condition Infant Feeding: Bottle Infant Disposition:home with mother Discharge instruction: per After Visit Summary and Postpartum booklet. Activity: Advance as tolerated. Pelvic rest for 6 weeks.  Diet: routine diet Postpartum Appointment:2 weeks Follow up Visit:  Follow-up Information    Edwinna Areola, DO. Schedule an appointment as soon as possible for a visit in 2 week(s).   Specialty: Obstetrics and Gynecology Contact information: [address] [place] [address] [phone]                   12/15/2019 Brandy Niece, MD

## 2019-12-15 NOTE — Discharge Instructions (Signed)
As per discharge pamphlet °

## 2020-02-11 ENCOUNTER — Ambulatory Visit: Payer: Self-pay

## 2020-02-13 ENCOUNTER — Ambulatory Visit: Payer: Self-pay

## 2020-12-18 ENCOUNTER — Ambulatory Visit: Payer: Medicaid Other | Admitting: Nurse Practitioner

## 2021-03-11 ENCOUNTER — Ambulatory Visit: Payer: Self-pay

## 2021-04-06 ENCOUNTER — Other Ambulatory Visit: Payer: Self-pay

## 2021-04-06 ENCOUNTER — Encounter (HOSPITAL_COMMUNITY): Payer: Self-pay | Admitting: Emergency Medicine

## 2021-04-06 ENCOUNTER — Emergency Department (HOSPITAL_COMMUNITY)
Admission: EM | Admit: 2021-04-06 | Discharge: 2021-04-07 | Disposition: A | Discharge status: Home / Self Care | Payer: Medicaid Other | Attending: Emergency Medicine | Admitting: Emergency Medicine

## 2021-04-06 ENCOUNTER — Emergency Department (HOSPITAL_COMMUNITY): Payer: Medicaid Other

## 2021-04-06 DIAGNOSIS — T508X5A Adverse effect of diagnostic agents, initial encounter: Secondary | ICD-10-CM | POA: Diagnosis not present

## 2021-04-06 DIAGNOSIS — M549 Dorsalgia, unspecified: Secondary | ICD-10-CM | POA: Diagnosis present

## 2021-04-06 DIAGNOSIS — N12 Tubulo-interstitial nephritis, not specified as acute or chronic: Secondary | ICD-10-CM | POA: Diagnosis not present

## 2021-04-06 DIAGNOSIS — Z91041 Radiographic dye allergy status: Secondary | ICD-10-CM

## 2021-04-06 DIAGNOSIS — R220 Localized swelling, mass and lump, head: Secondary | ICD-10-CM | POA: Insufficient documentation

## 2021-04-06 DIAGNOSIS — N9489 Other specified conditions associated with female genital organs and menstrual cycle: Secondary | ICD-10-CM | POA: Diagnosis not present

## 2021-04-06 LAB — URINALYSIS, MICROSCOPIC (REFLEX)
RBC / HPF: >50 RBC/hpf (ref 0–5)
WBC, UA: >50 WBC/hpf (ref 0–5)

## 2021-04-06 LAB — I-STAT BETA HCG BLOOD, ED (MC, WL, AP ONLY): I-stat hCG, quantitative: <5 m[IU]/mL (ref <5)

## 2021-04-06 LAB — CBC
HCT: 36.1 % (ref 36.0–46.0)
Hemoglobin: 11.8 g/dL — ABNORMAL LOW (ref 12.0–15.0)
MCH: 30.9 pg (ref 26.0–34.0)
MCHC: 32.7 g/dL (ref 30.0–36.0)
MCV: 94.5 fL (ref 80.0–100.0)
Platelets: 174 10*3/uL (ref 150–400)
RBC: 3.82 MIL/uL — ABNORMAL LOW (ref 3.87–5.11)
RDW: 14 % (ref 11.5–15.5)
WBC: 6.5 10*3/uL (ref 4.0–10.5)
nRBC: 0 % (ref 0.0–0.2)

## 2021-04-06 LAB — COMPREHENSIVE METABOLIC PANEL
ALT: 26 U/L (ref 0–44)
AST: 34 U/L (ref 15–41)
Albumin: 3.8 g/dL (ref 3.5–5.0)
Alkaline Phosphatase: 56 U/L (ref 38–126)
Anion gap: 11 (ref 5–15)
BUN: 5 mg/dL — ABNORMAL LOW (ref 6–20)
CO2: 24 mmol/L (ref 22–32)
Calcium: 8.9 mg/dL (ref 8.9–10.3)
Chloride: 99 mmol/L (ref 98–111)
Creatinine, Ser: 0.76 mg/dL (ref 0.44–1.00)
GFR, Estimated: >60 mL/min (ref >60)
Glucose, Bld: 84 mg/dL (ref 70–99)
Potassium: 3.8 mmol/L (ref 3.5–5.1)
Sodium: 134 mmol/L — ABNORMAL LOW (ref 135–145)
Total Bilirubin: 1.6 mg/dL — ABNORMAL HIGH (ref 0.3–1.2)
Total Protein: 7.5 g/dL (ref 6.5–8.1)

## 2021-04-06 LAB — URINALYSIS, ROUTINE W REFLEX MICROSCOPIC
Bilirubin Urine: NEGATIVE
Glucose, UA: NEGATIVE mg/dL
Ketones, ur: 15 mg/dL — AB
Nitrite: POSITIVE — AB
Protein, ur: 30 mg/dL — AB
Specific Gravity, Urine: 1.01 (ref 1.005–1.030)
pH: 6 (ref 5.0–8.0)

## 2021-04-06 LAB — LIPASE, BLOOD: Lipase: 23 U/L (ref 11–51)

## 2021-04-06 MED ORDER — SODIUM CHLORIDE 0.9 % IV BOLUS
1000.0000 mL | Freq: Once | INTRAVENOUS | Status: AC
  Administered 2021-04-06: 1000 mL via INTRAVENOUS

## 2021-04-06 MED ORDER — DIPHENHYDRAMINE HCL 50 MG/ML IJ SOLN
25.0000 mg | Freq: Once | INTRAMUSCULAR | Status: AC
  Administered 2021-04-06: 25 mg via INTRAVENOUS
  Filled 2021-04-06: qty 1

## 2021-04-06 MED ORDER — IOHEXOL 300 MG/ML  SOLN
80.0000 mL | Freq: Once | INTRAMUSCULAR | Status: AC | PRN
  Administered 2021-04-06: 80 mL via INTRAVENOUS

## 2021-04-06 MED ORDER — PREDNISONE 10 MG (21) PO TBPK: ORAL_TABLET | Freq: Every day | ORAL | 0 refills | Status: AC

## 2021-04-06 MED ORDER — KETOROLAC TROMETHAMINE 30 MG/ML IJ SOLN
30.0000 mg | Freq: Once | INTRAMUSCULAR | Status: AC
  Administered 2021-04-06: 30 mg via INTRAVENOUS
  Filled 2021-04-06: qty 1

## 2021-04-06 MED ORDER — CIPROFLOXACIN HCL 500 MG PO TABS
500.0000 mg | ORAL_TABLET | Freq: Once | ORAL | Status: AC
  Administered 2021-04-06: 500 mg via ORAL
  Filled 2021-04-06: qty 1

## 2021-04-06 MED ORDER — DIPHENHYDRAMINE HCL 25 MG PO TABS: 25.0000 mg | ORAL_TABLET | Freq: Four times a day (QID) | ORAL | 0 refills | Status: AC | PRN

## 2021-04-06 MED ORDER — EPINEPHRINE 0.3 MG/0.3ML IJ SOAJ
INTRAMUSCULAR | Status: AC
  Filled 2021-04-06: qty 0.3

## 2021-04-06 MED ORDER — CIPROFLOXACIN HCL 500 MG PO TABS: 500.0000 mg | ORAL_TABLET | Freq: Two times a day (BID) | ORAL | 0 refills | Status: AC

## 2021-04-06 MED ORDER — NITROFURANTOIN MONOHYD MACRO 100 MG PO CAPS: 100.0000 mg | ORAL_CAPSULE | Freq: Once | ORAL | Status: DC

## 2021-04-06 MED ORDER — METHYLPREDNISOLONE SODIUM SUCC 125 MG IJ SOLR
125.0000 mg | Freq: Once | INTRAMUSCULAR | Status: AC
  Administered 2021-04-06: 125 mg via INTRAVENOUS
  Filled 2021-04-06: qty 2

## 2021-04-06 NOTE — ED Triage Notes (Signed)
Patient arrives ambulatory c/o left side pain and back starting on Thursday that has progressively worsened. States pain has made it difficult for her to walk and breath. Patient does not appear in any distress, talking in full sentences. Also adds her urine is cloudy and small amount of blood for "a while." States her OBGYN told her to come here to be evaluated.

## 2021-04-06 NOTE — ED Provider Notes (Signed)
[facility] EMERGENCY DEPARTMENT Provider Note   CSN: [id number] Arrival date & time: 04/06/21  1517     History  Chief Complaint  Patient presents with   Back Pain    Brandy Ferguson is a 36 y.o. female.  Pt is a 36 yo bf with a hx of anemia and gerd.  She has had left sided back pain since Thursday, 1/26.  She said her urine is cloudy.  She has had fevers.  She had a CT abd/pelvis with contrast ordered in triage.  While getting the CT, she had an allergic rxn.      Home Medications Prior to Admission medications   Medication Sig Start Date End Date Taking? Authorizing Provider  ciprofloxacin (CIPRO) 500 MG tablet Take 1 tablet (500 mg total) by mouth every 12 (twelve) hours. 04/06/21  Yes Jacalyn Lefevre, MD  diphenhydrAMINE (BENADRYL) 25 MG tablet Take 1 tablet (25 mg total) by mouth every 6 (six) hours as needed. 04/06/21  Yes Jacalyn Lefevre, MD  predniSONE (STERAPRED UNI-PAK 21 TAB) 10 MG (21) TBPK tablet Take by mouth daily. Take 6 tabs by mouth daily  for 2 days, then 5 tabs for 2 days, then 4 tabs for 2 days, then 3 tabs for 2 days, 2 tabs for 2 days, then 1 tab by mouth daily for 2 days 04/06/21  Yes Jacalyn Lefevre, MD  cyclobenzaprine (FLEXERIL) 5 MG tablet Take 1 tablet (5 mg total) by mouth 3 (three) times daily as needed for muscle spasms. Patient not taking: Reported on 11/30/2019 11/13/19   Aviva Signs, CNM  ibuprofen (ADVIL) 800 MG tablet Take 1 tablet (800 mg total) by mouth every 8 (eight) hours. 12/15/19   Meisinger, Tawanna Cooler, MD  ondansetron (ZOFRAN ODT) 4 MG disintegrating tablet Take 1 tablet (4 mg total) by mouth every 6 (six) hours as needed for nausea. Patient not taking: Reported on 11/30/2019 07/05/19   Sharen Counter A, CNM  oxyCODONE (OXY IR/ROXICODONE) 5 MG immediate release tablet Take 1 tablet (5 mg total) by mouth every 4 (four) hours as needed for severe pain. 12/15/19   Meisinger, Tawanna Cooler, MD      Allergies    Bactrim  [sulfamethoxazole-trimethoprim], Flagyl [metronidazole], Lubricants, Nonoxynol 9, Omnipaque [iohexol], and Penicillins    Review of Systems   Review of Systems  Constitutional:  Positive for fever.  HENT:  Positive for facial swelling.   Gastrointestinal:  Positive for abdominal pain.  Musculoskeletal:  Positive for back pain.  Skin:  Positive for rash.  All other systems reviewed and are negative.  Physical Exam Updated Vital Signs BP 109/75    Pulse 64    Temp 99.1 F (37.3 C) (Oral)    Resp 17    Ht 5\' 3"  (1.6 m)    Wt 71.7 kg    SpO2 98%    BMI 27.99 kg/m  Physical Exam Vitals and nursing note reviewed.  Constitutional:      Appearance: Normal appearance.  HENT:     Head: Normocephalic and atraumatic.     Comments: Lips swollen    Right Ear: External ear normal.     Left Ear: External ear normal.     Nose: Nose normal.     Mouth/Throat:     Mouth: Mucous membranes are moist.     Pharynx: Oropharynx is clear.  Eyes:     Extraocular Movements: Extraocular movements intact.     Conjunctiva/sclera: Conjunctivae normal.     Pupils: Pupils are  equal, round, and reactive to light.  Cardiovascular:     Rate and Rhythm: Normal rate and regular rhythm.     Pulses: Normal pulses.     Heart sounds: Normal heart sounds.  Pulmonary:     Effort: Pulmonary effort is normal.     Breath sounds: Normal breath sounds.  Abdominal:     General: Abdomen is flat. Bowel sounds are normal.     Palpations: Abdomen is soft.  Musculoskeletal:        General: Normal range of motion.     Cervical back: Normal range of motion and neck supple.  Skin:    Capillary Refill: Capillary refill takes less than 2 seconds.     Comments: Diffuse urticaria  Neurological:     General: No focal deficit present.     Mental Status: She is alert and oriented to person, place, and time.    ED Results / Procedures / Treatments   Labs (all labs ordered are listed, but only abnormal results are  displayed) Labs Reviewed  COMPREHENSIVE METABOLIC PANEL - Abnormal; Notable for the following components:      Result Value   Sodium 134 (*)    BUN 5 (*)    Total Bilirubin 1.6 (*)    All other components within normal limits  CBC - Abnormal; Notable for the following components:   RBC 3.82 (*)    Hemoglobin 11.8 (*)    All other components within normal limits  URINALYSIS, ROUTINE W REFLEX MICROSCOPIC - Abnormal; Notable for the following components:   Hgb urine dipstick LARGE (*)    Ketones, ur 15 (*)    Protein, ur 30 (*)    Nitrite POSITIVE (*)    Leukocytes,Ua LARGE (*)    All other components within normal limits  URINALYSIS, MICROSCOPIC (REFLEX) - Abnormal; Notable for the following components:   Bacteria, UA MANY (*)    All other components within normal limits  LIPASE, BLOOD  I-STAT BETA HCG BLOOD, ED ([facility], [facility], [facility] ONLY)    EKG None  Radiology CT ABDOMEN PELVIS W CONTRAST  Result Date: 04/06/2021 CLINICAL DATA:  Left abdominal pain EXAM: CT ABDOMEN AND PELVIS WITH CONTRAST TECHNIQUE: Multidetector CT imaging of the abdomen and pelvis was performed using the standard protocol following bolus administration of intravenous contrast. RADIATION DOSE REDUCTION: This exam was performed according to the departmental dose-optimization program which includes automated exposure control, adjustment of the mA and/or kV according to patient size and/or use of iterative reconstruction technique. CONTRAST:  86mL OMNIPAQUE IOHEXOL 300 MG/ML  SOLN COMPARISON:  03/24/2003 FINDINGS: Lower chest: Lung bases are clear. Hepatobiliary: 3.6 cm hemangioma in medial aspect segment 7. 3.5 cm hemangioma in the medial aspect of segment 6 (series 3/image 23). Mild layering gallbladder sludge (series 3/image 24). No associated inflammatory changes. No intrahepatic or extrahepatic ductal dilatation. Pancreas: Within normal limits. Spleen: Within normal limits. Adrenals/Urinary Tract: Adrenal glands are within  normal limits. Heterogeneous enhancement of the bilateral kidneys, left greater than right (series 3/images 20 and 32), nonspecific but raising concern for pyelonephritis. No hydronephrosis. Thick-walled bladder, although underdistended. Stomach/Bowel: Stomach is within normal limits. No evidence of bowel obstruction. Appendix is not discretely visualized, favored to be surgically absent. No colonic wall thickening or inflammatory changes. Vascular/Lymphatic: No evidence of abdominal aortic aneurysm. No suspicious abdominopelvic lymphadenopathy. Reproductive: Uterus is within normal limits. Bilateral ovaries are within normal limits. Other: No abdominopelvic ascites. Musculoskeletal: Visualized osseous structures are within normal limits. IMPRESSION: Mildly thick-walled bladder,  correlate for cystitis. Heterogeneous enhancement of the bilateral kidneys, left greater than right, nonspecific but raising concern for pyelonephritis. Electronically Signed   By: Charline BillsSriyesh  Krishnan M.D.   On: 04/06/2021 19:17    Procedures Procedures    Medications Ordered in ED Medications  methylPREDNISolone sodium succinate (SOLU-MEDROL) 125 mg/2 mL injection 125 mg (125 mg Intravenous Given 04/06/21 1850)  diphenhydrAMINE (BENADRYL) injection 25 mg (25 mg Intravenous Given 04/06/21 1850)  sodium chloride 0.9 % bolus 1,000 mL (0 mLs Intravenous Stopped 04/06/21 2023)  iohexol (OMNIPAQUE) 300 MG/ML solution 80 mL (80 mLs Intravenous Contrast Given 04/06/21 1907)  ketorolac (TORADOL) 30 MG/ML injection 30 mg (30 mg Intravenous Given 04/06/21 2020)  sodium chloride 0.9 % bolus 1,000 mL (0 mLs Intravenous Stopped 04/06/21 2133)  ciprofloxacin (CIPRO) tablet 500 mg (500 mg Oral Given 04/06/21 2019)  sodium chloride 0.9 % bolus 1,000 mL (1,000 mLs Intravenous New Bag/Given 04/06/21 2239)    ED Course/ Medical Decision Making/ A&P                           Medical Decision Making Risk OTC drugs. Prescription drug  management.   IV contrast allergy: Pt was given solumedrol and benadryl.  Hives and swelling have completely resolved.  Pt was watched for several hours and has continued to do well.  Pt is d/c with a rx for benadryl and prednisone that she is to take tomorrow if she has any residual sx.  Pyelonephritis: UA is + for UTI.  CT shows pyelo.  Pt is given cipro.  She is given IVFs.  BP is on the lower side of normal, but she looks much better and feels better.  BP is normally in the 110 range, so it is not far from her normal.  I don't think she is septic or anaphylactic.  Pt knows to return if worse.         Final Clinical Impression(s) / ED Diagnoses Final diagnoses:  Allergy to intravenous contrast  Pyelonephritis    Rx / DC Orders ED Discharge Orders          Ordered    predniSONE (STERAPRED UNI-PAK 21 TAB) 10 MG (21) TBPK tablet  Daily        04/06/21 2329    diphenhydrAMINE (BENADRYL) 25 MG tablet  Every 6 hours PRN        04/06/21 2329    ciprofloxacin (CIPRO) 500 MG tablet  Every 12 hours        04/06/21 2330              Jacalyn LefevreHaviland, Nissa Stannard, MD 04/06/21 2335

## 2021-04-06 NOTE — Discharge Instructions (Addendum)
You have developed an allergy to IV dye used in CT scans.    Take the prednisone and benadryl if you feel itchy or swollen tomorrow.  If you feel fine, you do not have to take them.  Take the Cipro for your kidney infection.

## 2021-04-06 NOTE — ED Provider Triage Note (Signed)
Emergency Medicine Provider Triage Evaluation Note  Brandy Ferguson , a 36 y.o. female  was evaluated in triage.  Pt complains of left side and back pain since Thursday that is progressively worsened.  Patient states pain is so bad it is difficult for her to walk and breathe.  She also notes that her urine is cloudy and has a small amount of blood, is looking for "a while".  She states that her OB/GYN told her to come here to be evaluated.  She does have a history of kidney infection, states this pain feels similarly.  Review of Systems  Positive: Abdominal pain, left side pain, back pain, chills, nausea, vomiting, increased urinary frequency and urgency Negative: Fever, dysuria  Physical Exam  BP 116/87    Pulse 85    Temp 99.1 F (37.3 C) (Oral)    Resp 17    Ht 5\' 3"  (1.6 m)    Wt 71.7 kg    SpO2 100%    BMI 27.99 kg/m  Gen:   Awake, no distress   Resp:  Normal effort  MSK:   Moves extremities without difficulty  Other:    Medical Decision Making  Medically screening exam initiated at 4:01 PM.  Appropriate orders placed.  Ferguson was informed that the remainder of the evaluation will be completed by another provider, this initial triage assessment does not replace that evaluation, and the importance of remaining in the ED until their evaluation is complete.     Hrishikesh Hoeg T, PA-C 04/06/21 [phone]

## 2021-06-29 IMAGING — US US OB COMP LESS 14 WK
1 series · 15 of 28 positions shown · non-contrast
Comparison: None.

CLINICAL DATA: Left upper quadrant pain

EXAM:
OBSTETRIC <14 WK ULTRASOUND
TECHNIQUE: Transabdominal ultrasound was performed for evaluation of the
gestation as well as the maternal uterus and adnexal regions.

[Series 1: us ob comp less 14 wk · 43 acquisitions, 15 frames shown]
[im 1/43]
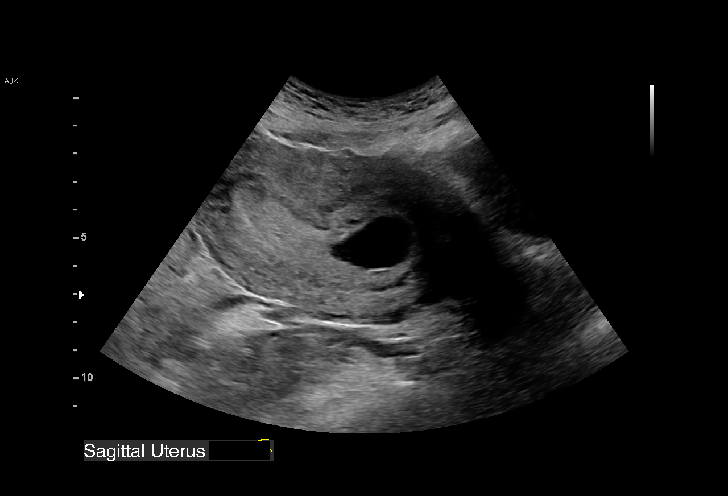
[im 4/43]
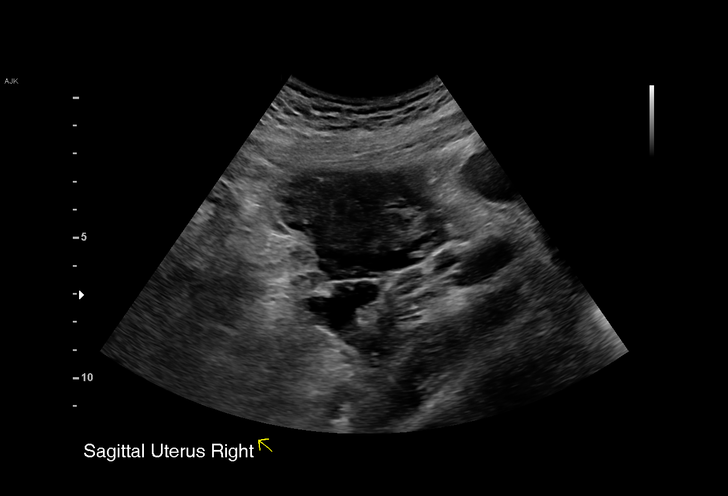
[im 7/43]
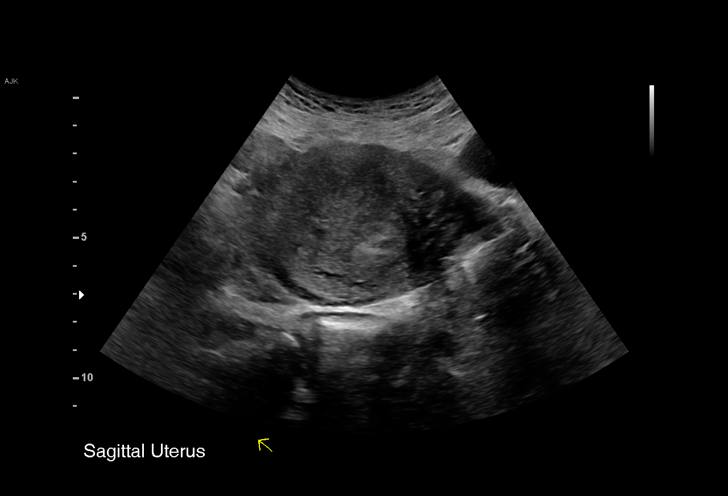
[im 10/43]
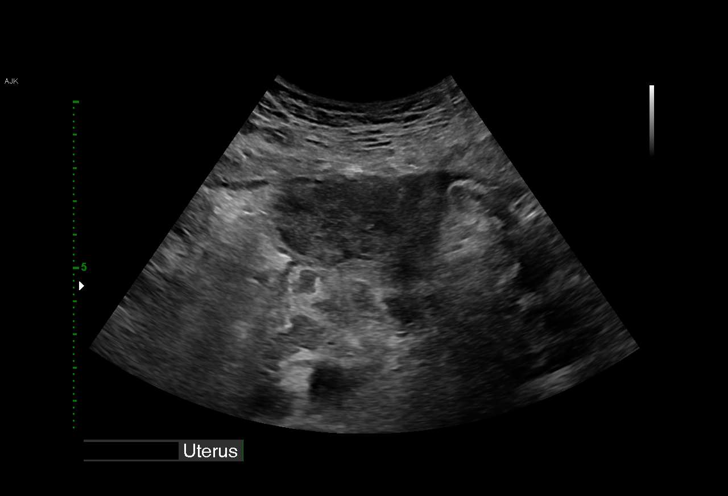
[im 13/43]
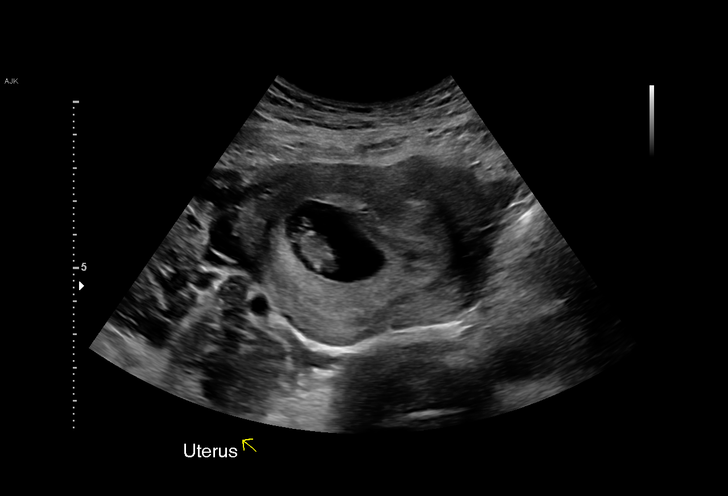
[im 16/43]
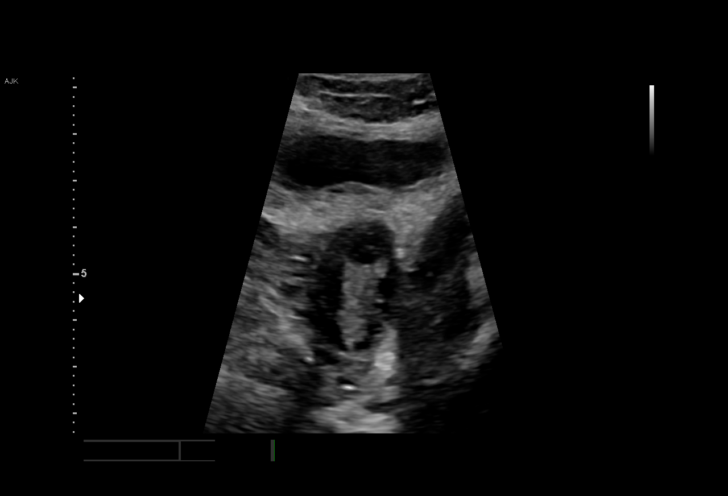
[im 19/43]
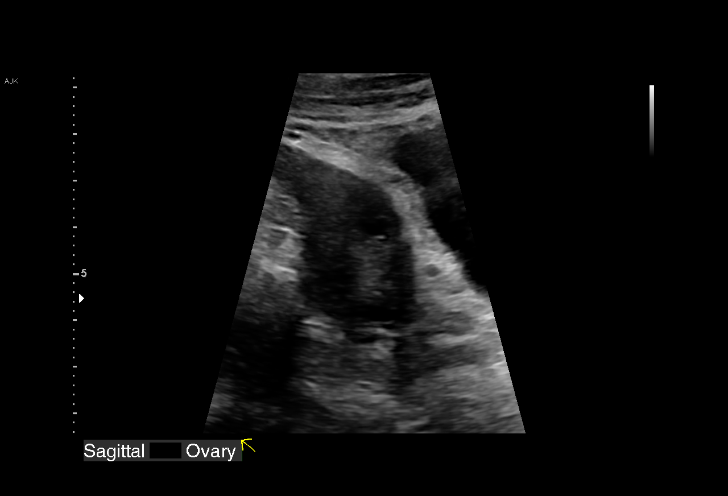
[im 22/43]
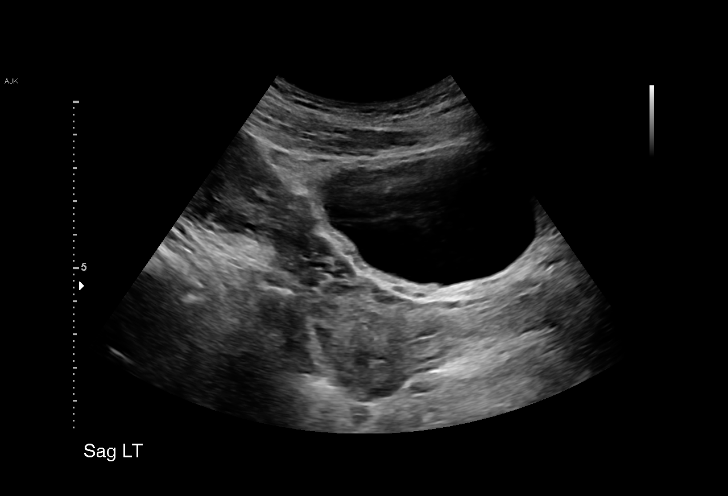
[im 24/43]
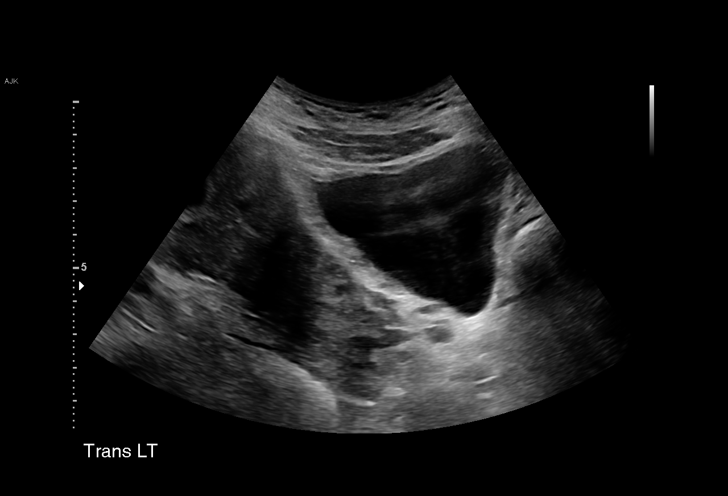
[im 27/43]
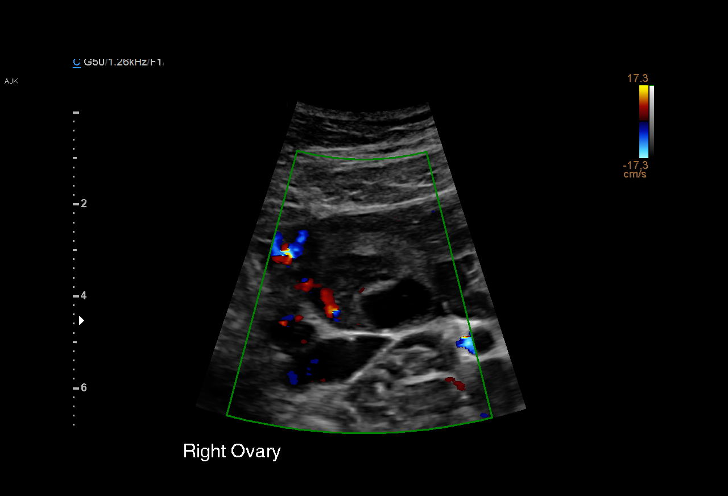
[im 30/43]
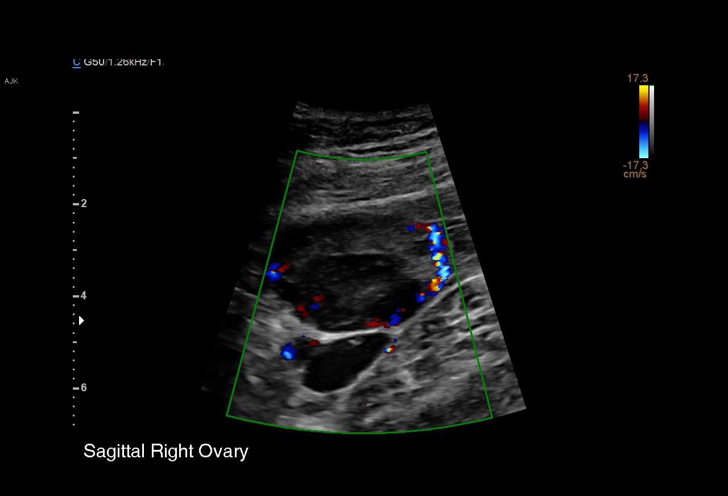
[im 33/43]
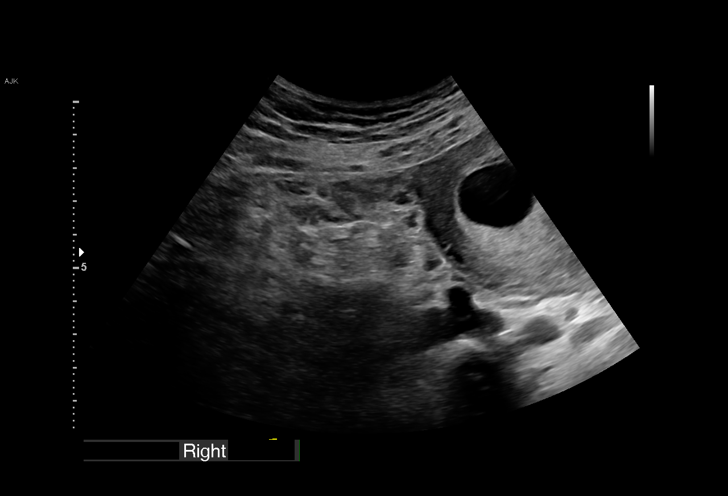
[im 36/43]
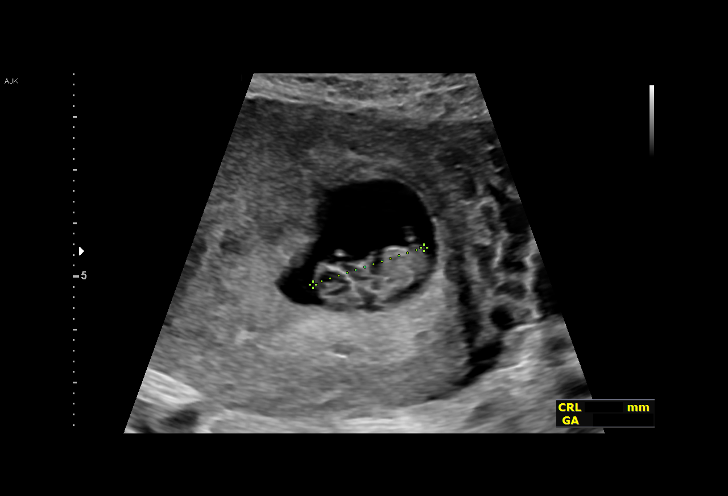
[im 39/43]
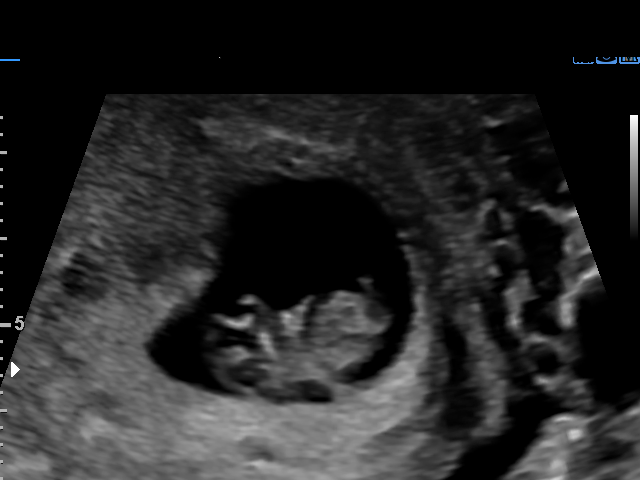
[im 43/43]
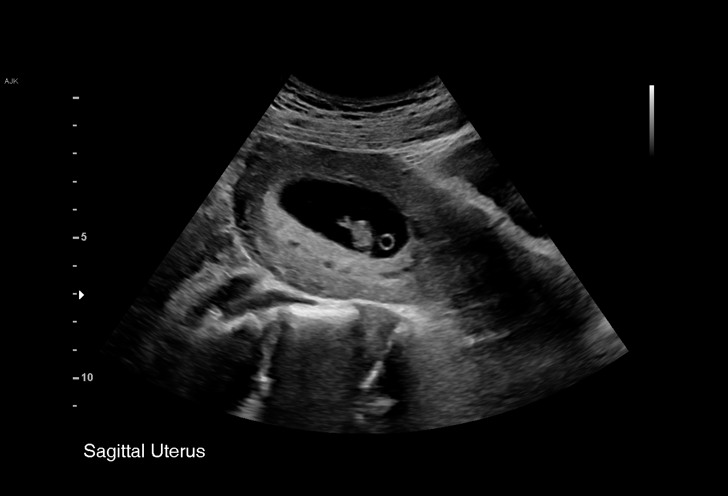

[15 of 28 positions shown; findings below may reference images not displayed]

FINDINGS: Intrauterine gestational sac: Single

Yolk sac:  Visualized.

Embryo:  Visualized.

Cardiac Activity: Visualized.

Heart Rate: 171 bpm

CRL: 22.1 mm   8 w 6 d                  US EDC: 12/19/2019

Subchorionic hemorrhage:  None visualized.

Maternal uterus/adnexae: Ovaries are within normal limits. Right
ovary measures 4.3 x 2.5 x 3.5 cm. The left ovary measures 3.6 x
x 2 cm. No significant free fluid.
IMPRESSION: Single viable intrauterine pregnancy as above. No specific
abnormalities are seen.

## 2021-07-31 ENCOUNTER — Ambulatory Visit: Payer: Medicaid Other | Admitting: Nurse Practitioner

## 2023-05-22 IMAGING — CT CT ABD-PELV W/ CM
2 of 4 series · 16 of 46 positions shown, 18 images · IV contrast (Omni 300)
Comparison: 03/24/2003

CLINICAL DATA: Left abdominal pain

EXAM:
CT ABDOMEN AND PELVIS WITH CONTRAST
TECHNIQUE: Multidetector CT imaging of the abdomen and pelvis was performed
using the standard protocol following bolus administration of
intravenous contrast.

[Series 3: a/p w/ 5mm · axial · 0.97mm/px · z∈[+463,+878]mm · 13 of 91 slices shown, 15 images]
[im 4/91  soft-tissue]
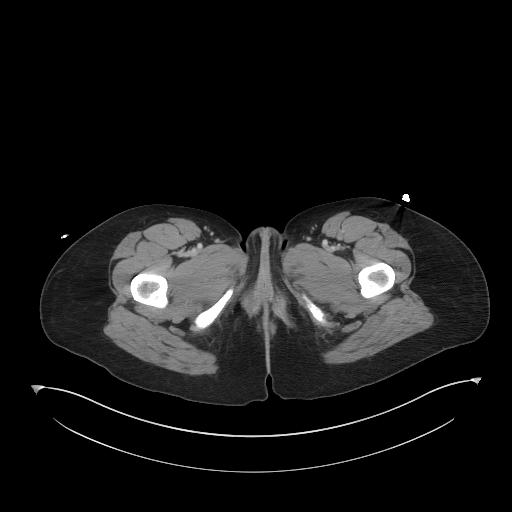
[im 4/91  bone]
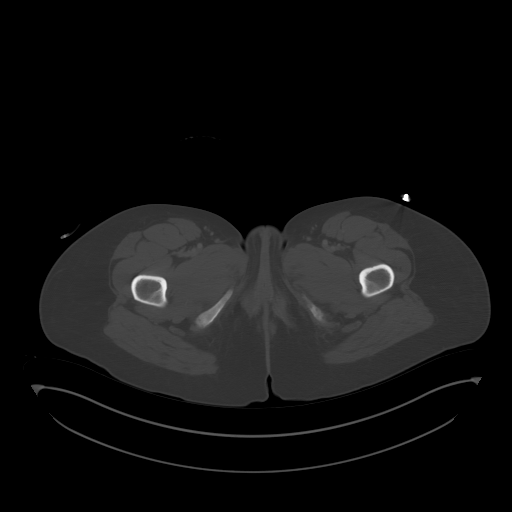
[im 11/91  soft-tissue]
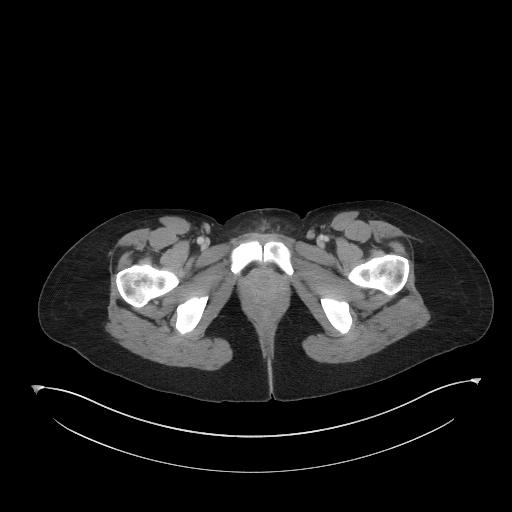
[im 19/91  soft-tissue]
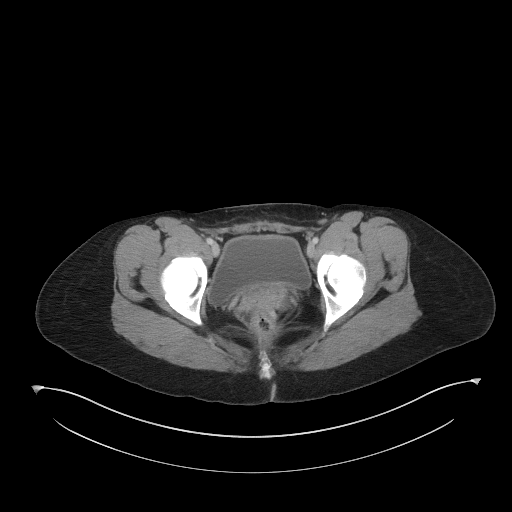
[im 26/91  soft-tissue]
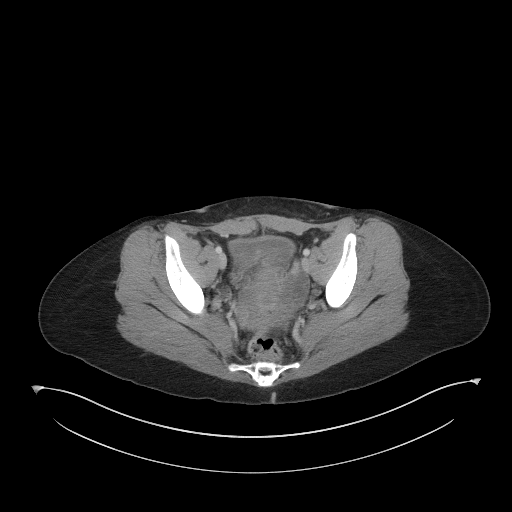
[im 33/91  soft-tissue]
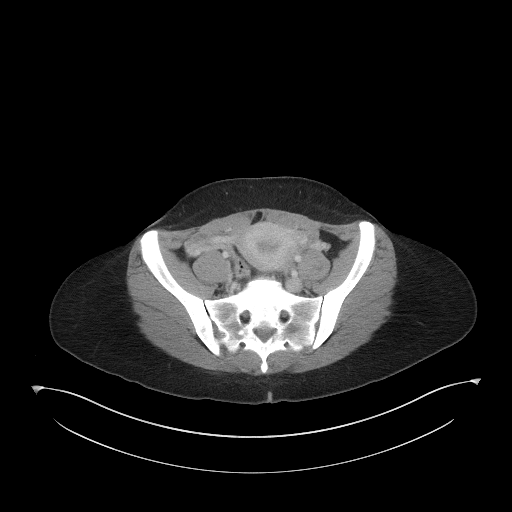
[im 40/91  soft-tissue]
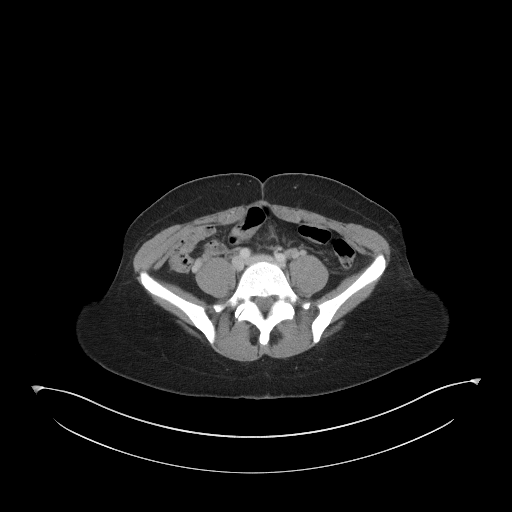
[im 47/91  soft-tissue]
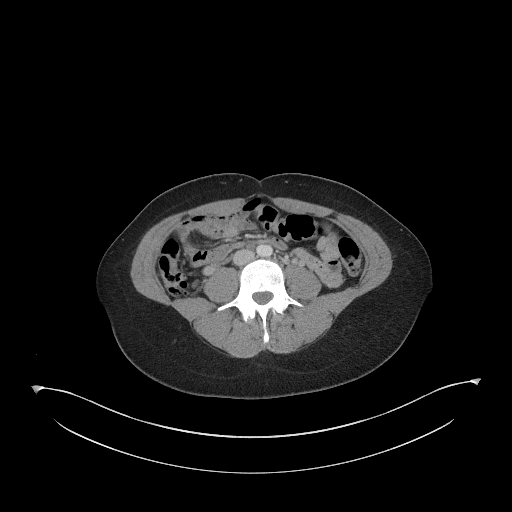
[im 51/91  soft-tissue]
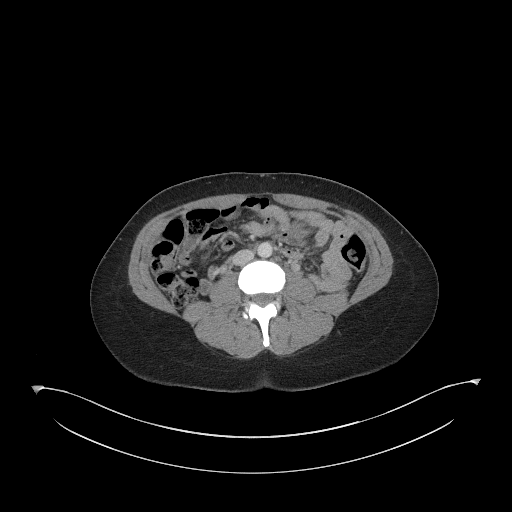
[im 58/91  soft-tissue]
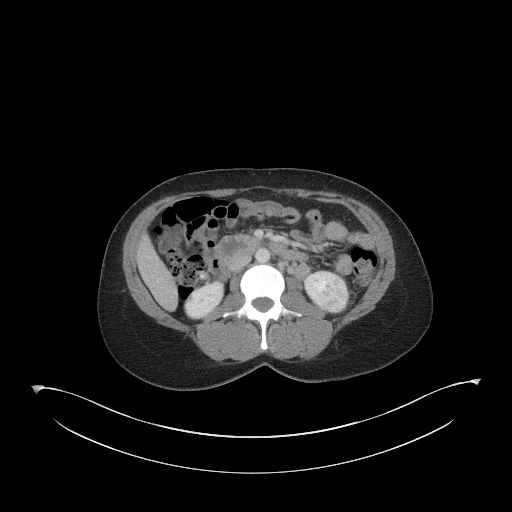
[im 58/91  bone]
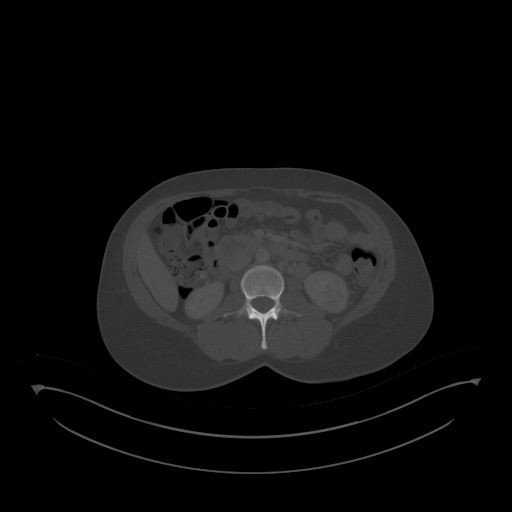
[im 65/91  soft-tissue]
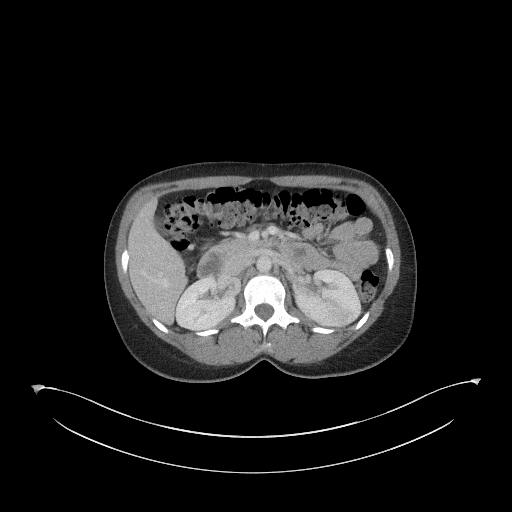
[im 73/91  soft-tissue]
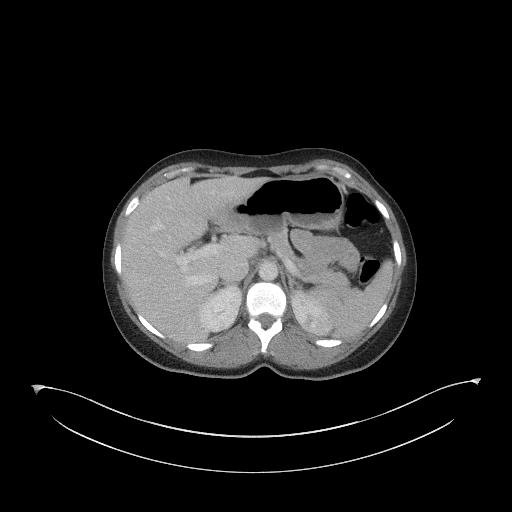
[im 80/91  soft-tissue]
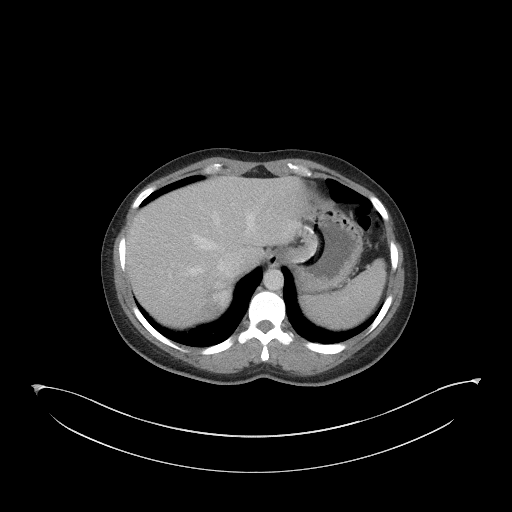
[im 87/91  soft-tissue]
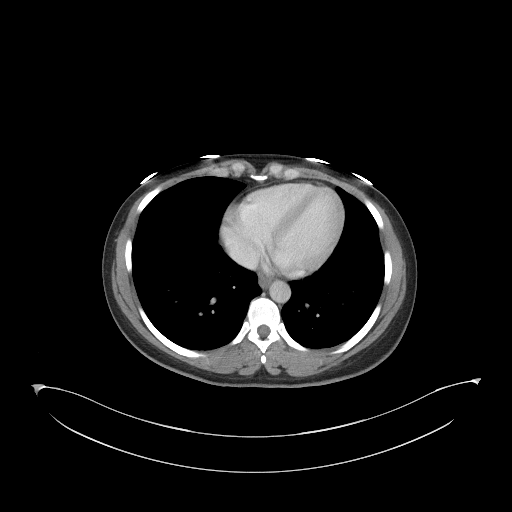

[Series 6: a/p w/ cor · coronal · 0.83mm/px · 3 of 147 slices shown]
[im 49/147  soft-tissue]
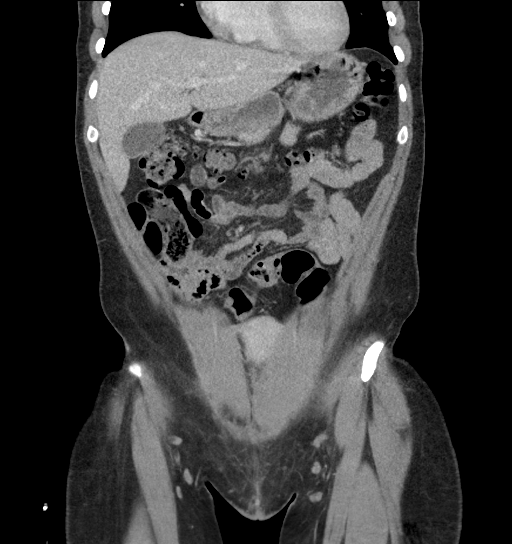
[im 65/147  soft-tissue]
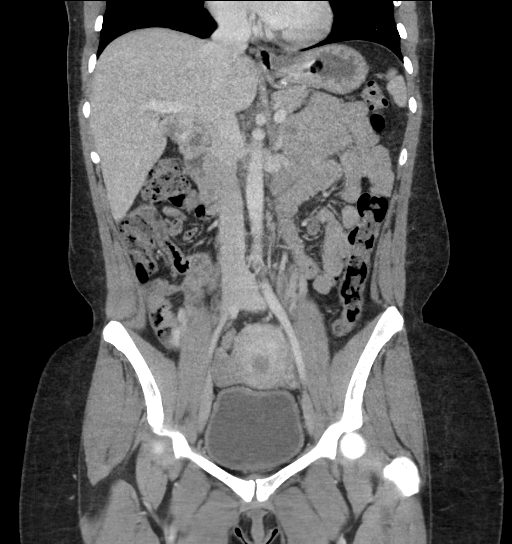
[im 82/147  soft-tissue]
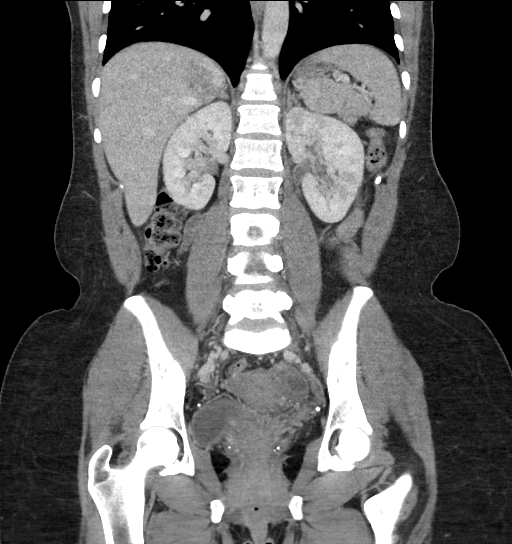

[16 of 46 positions shown; findings below may reference images not displayed]

RADIATION DOSE REDUCTION: This exam was performed according to the
departmental dose-optimization program which includes automated
exposure control, adjustment of the mA and/or kV according to
patient size and/or use of iterative reconstruction technique.

CONTRAST:  80mL OMNIPAQUE IOHEXOL 300 MG/ML  SOLN
FINDINGS: Lower chest: Lung bases are clear.

Hepatobiliary: 3.6 cm hemangioma in medial aspect segment 7. 3.5 cm
hemangioma in the medial aspect of segment 6 (series 3/image 23).

Mild layering gallbladder sludge (series 3/image 24). No associated
inflammatory changes. No intrahepatic or extrahepatic ductal
dilatation.

Pancreas: Within normal limits.

Spleen: Within normal limits.

Adrenals/Urinary Tract: Adrenal glands are within normal limits.

Heterogeneous enhancement of the bilateral kidneys, left greater
than right (series 3/images 20 and 32), nonspecific but raising
concern for pyelonephritis. No hydronephrosis.

Thick-walled bladder, although underdistended.

Stomach/Bowel: Stomach is within normal limits.

No evidence of bowel obstruction.

Appendix is not discretely visualized, favored to be surgically
absent.

No colonic wall thickening or inflammatory changes.

Vascular/Lymphatic: No evidence of abdominal aortic aneurysm.

No suspicious abdominopelvic lymphadenopathy.

Reproductive: Uterus is within normal limits.

Bilateral ovaries are within normal limits.

Other: No abdominopelvic ascites.

Musculoskeletal: Visualized osseous structures are within normal
limits.
IMPRESSION: Mildly thick-walled bladder, correlate for cystitis.

Heterogeneous enhancement of the bilateral kidneys, left greater
than right, nonspecific but raising concern for pyelonephritis.
